# Patient Record
Sex: Male | Born: 1940 | Race: White | Hispanic: No | Smoking: Former smoker
Health system: Southern US, Community
[De-identification: ages and names within clinical notes are randomized; demographics above are authoritative.]

## PROBLEM LIST (undated history)

## (undated) DIAGNOSIS — F419 Anxiety disorder, unspecified: Secondary | ICD-10-CM

## (undated) DIAGNOSIS — M549 Dorsalgia, unspecified: Secondary | ICD-10-CM

## (undated) DIAGNOSIS — N400 Enlarged prostate without lower urinary tract symptoms: Secondary | ICD-10-CM

## (undated) DIAGNOSIS — K648 Other hemorrhoids: Secondary | ICD-10-CM

## (undated) DIAGNOSIS — F329 Major depressive disorder, single episode, unspecified: Secondary | ICD-10-CM

## (undated) DIAGNOSIS — E782 Mixed hyperlipidemia: Secondary | ICD-10-CM

## (undated) DIAGNOSIS — H9202 Otalgia, left ear: Secondary | ICD-10-CM

## (undated) DIAGNOSIS — C4490 Unspecified malignant neoplasm of skin, unspecified: Secondary | ICD-10-CM

## (undated) DIAGNOSIS — F32A Depression, unspecified: Secondary | ICD-10-CM

## (undated) DIAGNOSIS — M629 Disorder of muscle, unspecified: Secondary | ICD-10-CM

## (undated) DIAGNOSIS — F1010 Alcohol abuse, uncomplicated: Secondary | ICD-10-CM

## (undated) DIAGNOSIS — H269 Unspecified cataract: Secondary | ICD-10-CM

## (undated) DIAGNOSIS — K449 Diaphragmatic hernia without obstruction or gangrene: Secondary | ICD-10-CM

## (undated) DIAGNOSIS — C61 Malignant neoplasm of prostate: Secondary | ICD-10-CM

## (undated) DIAGNOSIS — I10 Essential (primary) hypertension: Secondary | ICD-10-CM

## (undated) DIAGNOSIS — K219 Gastro-esophageal reflux disease without esophagitis: Secondary | ICD-10-CM

## (undated) DIAGNOSIS — H6120 Impacted cerumen, unspecified ear: Secondary | ICD-10-CM

## (undated) DIAGNOSIS — I77810 Thoracic aortic ectasia: Secondary | ICD-10-CM

## (undated) HISTORY — DX: Diaphragmatic hernia without obstruction or gangrene: K44.9

## (undated) HISTORY — DX: Major depressive disorder, single episode, unspecified: F32.9

## (undated) HISTORY — DX: Impacted cerumen, unspecified ear: H61.20

## (undated) HISTORY — DX: Disorder of muscle, unspecified: M62.9

## (undated) HISTORY — DX: Essential (primary) hypertension: I10

## (undated) HISTORY — DX: Thoracic aortic ectasia: I77.810

## (undated) HISTORY — DX: Benign prostatic hyperplasia without lower urinary tract symptoms: N40.0

## (undated) HISTORY — DX: Otalgia, left ear: H92.02

## (undated) HISTORY — DX: Dorsalgia, unspecified: M54.9

## (undated) HISTORY — DX: Unspecified malignant neoplasm of skin, unspecified: C44.90

## (undated) HISTORY — DX: Depression, unspecified: F32.A

## (undated) HISTORY — DX: Other hemorrhoids: K64.8

## (undated) HISTORY — PX: HERNIA REPAIR: SHX51

## (undated) HISTORY — PX: BACK SURGERY: SHX140

## (undated) HISTORY — DX: Alcohol abuse, uncomplicated: F10.10

## (undated) HISTORY — DX: Mixed hyperlipidemia: E78.2

## (undated) HISTORY — DX: Anxiety disorder, unspecified: F41.9

## (undated) HISTORY — DX: Gastro-esophageal reflux disease without esophagitis: K21.9

---

## 1995-02-15 HISTORY — PX: LAPAROSCOPIC INGUINAL HERNIA REPAIR: SUR788

## 1996-12-15 HISTORY — PX: OTHER SURGICAL HISTORY: SHX169

## 1998-09-15 HISTORY — PX: LUMBAR SPINE SURGERY: SHX701

## 1998-10-07 HISTORY — PX: MRI: SHX5353

## 1999-02-05 HISTORY — PX: OTHER SURGICAL HISTORY: SHX169

## 1999-03-06 ENCOUNTER — Encounter: Admission: RE | Admit: 1999-03-06 | Discharge: 1999-03-06 | Payer: Self-pay | Admitting: Rheumatology

## 1999-03-06 ENCOUNTER — Encounter: Payer: Self-pay | Admitting: Rheumatology

## 2000-01-16 HISTORY — PX: MYELOGRAM: SHX5347

## 2000-02-09 ENCOUNTER — Encounter: Payer: Self-pay | Admitting: Neurosurgery

## 2000-02-09 ENCOUNTER — Ambulatory Visit (HOSPITAL_COMMUNITY): Admission: RE | Admit: 2000-02-09 | Discharge: 2000-02-09 | Payer: Self-pay | Admitting: Neurosurgery

## 2000-02-15 HISTORY — PX: OTHER SURGICAL HISTORY: SHX169

## 2000-03-02 ENCOUNTER — Other Ambulatory Visit: Admission: RE | Admit: 2000-03-02 | Discharge: 2000-03-02 | Payer: Self-pay | Admitting: Gastroenterology

## 2000-03-02 ENCOUNTER — Encounter (INDEPENDENT_AMBULATORY_CARE_PROVIDER_SITE_OTHER): Payer: Self-pay | Admitting: Specialist

## 2000-03-02 HISTORY — PX: ESOPHAGOGASTRODUODENOSCOPY: SHX1529

## 2000-03-10 ENCOUNTER — Ambulatory Visit (HOSPITAL_COMMUNITY): Admission: RE | Admit: 2000-03-10 | Discharge: 2000-03-10 | Payer: Self-pay | Admitting: Neurosurgery

## 2000-03-10 ENCOUNTER — Encounter: Payer: Self-pay | Admitting: Neurosurgery

## 2003-03-12 ENCOUNTER — Ambulatory Visit (HOSPITAL_BASED_OUTPATIENT_CLINIC_OR_DEPARTMENT_OTHER): Admission: RE | Admit: 2003-03-12 | Discharge: 2003-03-12 | Payer: Self-pay | Admitting: Otolaryngology

## 2003-03-12 ENCOUNTER — Encounter (INDEPENDENT_AMBULATORY_CARE_PROVIDER_SITE_OTHER): Payer: Self-pay | Admitting: *Deleted

## 2003-03-12 ENCOUNTER — Ambulatory Visit (HOSPITAL_COMMUNITY): Admission: RE | Admit: 2003-03-12 | Discharge: 2003-03-12 | Payer: Self-pay | Admitting: Otolaryngology

## 2003-08-23 ENCOUNTER — Encounter (INDEPENDENT_AMBULATORY_CARE_PROVIDER_SITE_OTHER): Payer: Self-pay | Admitting: Internal Medicine

## 2003-08-23 LAB — CONVERTED CEMR LAB: PSA: 10.4 ng/mL

## 2003-09-15 HISTORY — PX: ESOPHAGOGASTRODUODENOSCOPY: SHX1529

## 2003-09-15 HISTORY — PX: COLONOSCOPY: SHX174

## 2005-03-31 ENCOUNTER — Ambulatory Visit: Payer: Self-pay | Admitting: Gastroenterology

## 2005-11-15 ENCOUNTER — Encounter (INDEPENDENT_AMBULATORY_CARE_PROVIDER_SITE_OTHER): Payer: Self-pay | Admitting: Internal Medicine

## 2005-11-15 ENCOUNTER — Ambulatory Visit: Payer: Self-pay | Admitting: Family Medicine

## 2006-02-28 ENCOUNTER — Ambulatory Visit: Payer: Self-pay | Admitting: Family Medicine

## 2006-07-08 ENCOUNTER — Ambulatory Visit: Payer: Self-pay | Admitting: Family Medicine

## 2006-10-19 ENCOUNTER — Ambulatory Visit: Payer: Self-pay | Admitting: Gastroenterology

## 2007-03-03 ENCOUNTER — Encounter (INDEPENDENT_AMBULATORY_CARE_PROVIDER_SITE_OTHER): Payer: Self-pay | Admitting: Internal Medicine

## 2007-03-07 ENCOUNTER — Encounter (INDEPENDENT_AMBULATORY_CARE_PROVIDER_SITE_OTHER): Payer: Self-pay | Admitting: Internal Medicine

## 2007-03-07 DIAGNOSIS — F1010 Alcohol abuse, uncomplicated: Secondary | ICD-10-CM | POA: Insufficient documentation

## 2007-03-07 DIAGNOSIS — K648 Other hemorrhoids: Secondary | ICD-10-CM

## 2007-03-07 DIAGNOSIS — K219 Gastro-esophageal reflux disease without esophagitis: Secondary | ICD-10-CM | POA: Insufficient documentation

## 2007-03-10 ENCOUNTER — Encounter (INDEPENDENT_AMBULATORY_CARE_PROVIDER_SITE_OTHER): Payer: Self-pay | Admitting: Internal Medicine

## 2007-03-10 ENCOUNTER — Ambulatory Visit: Payer: Self-pay | Admitting: Family Medicine

## 2007-03-13 DIAGNOSIS — H9190 Unspecified hearing loss, unspecified ear: Secondary | ICD-10-CM | POA: Insufficient documentation

## 2007-04-21 ENCOUNTER — Ambulatory Visit: Payer: Self-pay | Admitting: Internal Medicine

## 2007-04-28 ENCOUNTER — Ambulatory Visit: Payer: Self-pay | Admitting: Internal Medicine

## 2007-04-28 LAB — CONVERTED CEMR LAB
CO2: 23 meq/L (ref 19–32)
Glucose, Bld: 80 mg/dL (ref 70–99)
Potassium: 4.2 meq/L (ref 3.5–5.3)
Sodium: 141 meq/L (ref 135–145)

## 2007-05-18 HISTORY — PX: CARDIOVASCULAR STRESS TEST: SHX262

## 2007-05-19 ENCOUNTER — Ambulatory Visit: Payer: Self-pay

## 2007-05-19 ENCOUNTER — Encounter: Payer: Self-pay | Admitting: Family Medicine

## 2007-05-19 HISTORY — PX: OTHER SURGICAL HISTORY: SHX169

## 2007-06-23 ENCOUNTER — Ambulatory Visit: Payer: Self-pay | Admitting: Internal Medicine

## 2007-06-23 LAB — CONVERTED CEMR LAB
CO2: 23 meq/L (ref 19–32)
Cholesterol: 172 mg/dL (ref 0–200)
Glucose, Bld: 82 mg/dL (ref 70–99)
Sodium: 140 meq/L (ref 135–145)
Total Bilirubin: 0.8 mg/dL (ref 0.3–1.2)
Total Protein: 6.8 g/dL (ref 6.0–8.3)
Triglycerides: 98 mg/dL (ref <150)
VLDL: 20 mg/dL (ref 0–40)

## 2008-05-17 DIAGNOSIS — C61 Malignant neoplasm of prostate: Secondary | ICD-10-CM

## 2008-05-17 HISTORY — DX: Malignant neoplasm of prostate: C61

## 2008-08-20 ENCOUNTER — Encounter (INDEPENDENT_AMBULATORY_CARE_PROVIDER_SITE_OTHER): Payer: Self-pay | Admitting: *Deleted

## 2008-09-18 ENCOUNTER — Ambulatory Visit: Payer: Self-pay | Admitting: Family Medicine

## 2008-09-27 ENCOUNTER — Ambulatory Visit: Payer: Self-pay | Admitting: Family Medicine

## 2008-09-30 LAB — CONVERTED CEMR LAB
ALT: 18 units/L (ref 0–53)
AST: 19 units/L (ref 0–37)
CO2: 30 meq/L (ref 19–32)
Calcium: 8.8 mg/dL (ref 8.4–10.5)
GFR calc non Af Amer: 64.08 mL/min (ref >60)
HDL: 28.6 mg/dL — ABNORMAL LOW (ref >39.00)
LDL Cholesterol: 126 mg/dL — ABNORMAL HIGH (ref 0–99)
Sodium: 144 meq/L (ref 135–145)
Total CHOL/HDL Ratio: 6
VLDL: 14.6 mg/dL (ref 0.0–40.0)

## 2008-10-25 DIAGNOSIS — E785 Hyperlipidemia, unspecified: Secondary | ICD-10-CM

## 2008-10-25 DIAGNOSIS — I10 Essential (primary) hypertension: Secondary | ICD-10-CM | POA: Insufficient documentation

## 2008-11-13 ENCOUNTER — Ambulatory Visit: Payer: Self-pay | Admitting: Family Medicine

## 2008-11-14 LAB — CONVERTED CEMR LAB
AST: 19 units/L (ref 0–37)
Cholesterol: 119 mg/dL (ref 0–200)

## 2009-05-22 ENCOUNTER — Ambulatory Visit: Payer: Self-pay | Admitting: Family Medicine

## 2009-05-23 LAB — CONVERTED CEMR LAB
Cholesterol: 118 mg/dL (ref 0–200)
HDL: 30.2 mg/dL — ABNORMAL LOW (ref >39.00)
VLDL: 16 mg/dL (ref 0.0–40.0)

## 2009-06-05 ENCOUNTER — Telehealth: Payer: Self-pay | Admitting: Gastroenterology

## 2009-06-06 ENCOUNTER — Ambulatory Visit: Payer: Self-pay | Admitting: Family Medicine

## 2009-06-06 DIAGNOSIS — J320 Chronic maxillary sinusitis: Secondary | ICD-10-CM

## 2009-12-18 ENCOUNTER — Ambulatory Visit: Payer: Self-pay | Admitting: Family Medicine

## 2009-12-18 DIAGNOSIS — J019 Acute sinusitis, unspecified: Secondary | ICD-10-CM | POA: Insufficient documentation

## 2010-02-13 ENCOUNTER — Telehealth: Payer: Self-pay | Admitting: Family Medicine

## 2010-02-13 ENCOUNTER — Ambulatory Visit: Payer: Self-pay | Admitting: Family Medicine

## 2010-02-13 DIAGNOSIS — G4700 Insomnia, unspecified: Secondary | ICD-10-CM

## 2010-04-03 ENCOUNTER — Encounter: Payer: Self-pay | Admitting: Family Medicine

## 2010-06-14 LAB — CONVERTED CEMR LAB
ALT: 14 units/L (ref 0–53)
AST: 14 units/L (ref 0–37)
Albumin: 4.7 g/dL (ref 3.5–5.2)
Alkaline Phosphatase: 71 units/L (ref 39–117)
BUN: 15 mg/dL (ref 6–23)
Basophils Absolute: 0.1 10*3/uL (ref 0.0–0.1)
Basophils Relative: 1 % (ref 0–1)
CO2: 25 meq/L (ref 19–32)
CRP: 0.2 mg/dL (ref <0.6)
Calcium: 9.6 mg/dL (ref 8.4–10.5)
Chloride: 108 meq/L (ref 96–112)
Cholesterol: 202 mg/dL — ABNORMAL HIGH (ref 0–200)
Creatinine, Ser: 1.26 mg/dL (ref 0.40–1.50)
Eosinophils Absolute: 0.3 10*3/uL (ref 0.0–0.7)
Eosinophils Relative: 3 % (ref 0–5)
Glucose, Bld: 84 mg/dL (ref 70–99)
HCT: 47.7 % (ref 39.0–52.0)
HDL: 39 mg/dL — ABNORMAL LOW (ref >39)
Hemoglobin: 16.8 g/dL (ref 13.0–17.0)
LDL Cholesterol: 138 mg/dL — ABNORMAL HIGH (ref 0–99)
Lymphocytes Relative: 29 % (ref 12–46)
Lymphs Abs: 2.4 10*3/uL (ref 0.7–3.3)
MCHC: 35.2 g/dL (ref 30.0–36.0)
MCV: 84.1 fL (ref 78.0–100.0)
Microalb, Ur: 0.86 mg/dL (ref 0.00–1.89)
Monocytes Absolute: 0.7 10*3/uL (ref 0.2–0.7)
Monocytes Relative: 9 % (ref 3–11)
Neutro Abs: 4.8 10*3/uL (ref 1.7–7.7)
Neutrophils Relative %: 59 % (ref 43–77)
Platelets: 197 10*3/uL (ref 150–400)
Potassium: 4.4 meq/L (ref 3.5–5.3)
RBC: 5.67 M/uL (ref 4.22–5.81)
RDW: 13.3 % (ref 11.5–14.0)
Sodium: 142 meq/L (ref 135–145)
TSH: 1.751 microintl units/mL (ref 0.350–5.50)
Total Bilirubin: 0.9 mg/dL (ref 0.3–1.2)
Total CHOL/HDL Ratio: 5.2
Total Protein: 6.8 g/dL (ref 6.0–8.3)
Triglycerides: 126 mg/dL (ref <150)
VLDL: 25 mg/dL (ref 0–40)
WBC: 8.2 10*3/uL (ref 4.0–10.5)

## 2010-06-16 NOTE — Assessment & Plan Note (Signed)
Summary: 10:15 COUGH,CONGESTION,EARS/CLE   Vital Signs:  Patient profile:   70 year old male Weight:      211 pounds BMI:     31.96 Temp:     98.1 degrees F oral Pulse rate:   88 / minute Pulse rhythm:   regular BP sitting:   140 / 80  (left arm) Cuff size:   regular  Vitals Entered By: Lowella Petties CMA (June 06, 2009 10:42 AM) CC: Right ear stopped up, nose is hurting.   History of Present Illness: thinks he has a sinus infection with facial pain  nose and ear are hurting  clear nasal d/c going on about a week- much worse yesterday no fever   no chills or aches  a little dry cough - not bad   no otc meds   Allergies: 1)  Codeine Phosphate (Codeine Phosphate) 2)  Biaxin (Clarithromycin) 3)  Relafen  Past History:  Past Medical History: Last updated: 10/25/2008 1. CHEST PAIN  2. HYPERTENSION 3. HYPERLIPIDEMIA-MIXED   4. EAR PAIN, LEFT   5. HEARING LOSS   6. CERUMEN IMPACTION, LEFT  7. INTERNAL HEMORRHOIDS                   8. HIATAL HERNIA, NODULE   9. BACK PAIN, (KRITZER), MICRODISKECTOMY-GREAT RESULT   10. GERD  11. BENIGN PROSTATIC HYPERTROPHY  12. ANXIETY DEPRESSION, CHARTER ADMISSION  13. ALCOHOL ABUSE   14.  DISORDER, MUSCLE/LIGAMENT NOS, RT LOWER EXTREMITY   15. INGUINAL HERNIA, LEFT, HX OF, REPAIR  16. COLONIC POLYPS, HX OF/ INTERNAL HEMORRHOIDS  17. SKIN CANCER, HX OF, LEFT EAR    Past Surgical History: Last updated: 05/28/2007 Levitin eval - hip pain 02/05/99 Lumbar spine-- 05/00 MRI, degen changes (Dr. 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 Pylori-- 05/05 Colonoscopy, polyps, int. hemorrhoids, repeat 10 years-- 05/05 H. Pylori positive--08/98 L inguinal hernia repair--10/96 ETT Adenosine Myoview nml 05/19/07  Family History: Last updated: 03/12/07 Father: Died at age 76, CAD, HTN, cancer,  prostate-- bone Mother: Alive, HTN, skin cancer--died 03-04-2006--COPD Siblings: 2 brothers,--1 with ? lung ca                                    1 welli                                                                                                                            1sister--died lung ca CV + CAD, older brother, uncle HBP + Mother, father DM + GP CA + Dad, prostate Stroke + GM  Social History: Last updated: 10/25/2008 Marital Status: Married Children: One daughter Occupation: A welder Tobacco Use - Former.  - '73 Alcohol Use - no Regular Exercise - yes Drug Use - no  Risk Factors: Alcohol Use: 0 (09/18/2008) Caffeine Use:  2 (09/18/2008) Exercise: yes (10/25/2008)  Risk Factors: Smoking Status: quit (10/25/2008) Cans of tobacco/wk: 1 3oz bags q3d--chews--started at age 16yrs (09/18/2008) Passive Smoke Exposure: no (03/10/2007)  Review of Systems General:  Complains of fatigue and malaise; denies chills and fever. Eyes:  Denies blurring and eye irritation. ENT:  Complains of earache, hoarseness, nasal congestion, postnasal drainage, sinus pressure, and sore throat. CV:  Denies chest pain or discomfort and lightheadness. Resp:  Complains of cough; denies sputum productive and wheezing. GI:  Denies abdominal pain and change in bowel habits. Derm:  Denies lesion(s) and rash.  Physical Exam  General:  overweight but generally well appearing  Head:  normocephalic, atraumatic, and no abnormalities observed.  tender R maxillary sinus  Eyes:  vision grossly intact, pupils equal, pupils round, pupils reactive to light, and no injection.   Ears:  dull R TM - no drainage L TM nl  Nose:  nares are injected and congested - worse on R Mouth:  pharynx pink and moist, no erythema, and no exudates.   Neck:  No deformities, masses, or tenderness noted. Lungs:  CTA good air exch  no rales or rhonchi Heart:  Normal rate and regular rhythm. S1 and S2 normal without gallop, murmur,  click, rub or other extra sounds. Skin:  Intact without suspicious lesions or rashes Cervical Nodes:  No lymphadenopathy noted Psych:  normal affect, talkative and pleasant    Impression & Recommendations:  Problem # 1:  MAXILLARY SINUSITIS (ICD-473.0) Assessment New with congestion and facial pain  will cover with augmentin  recommend sympt care- see pt instructions   pt advised to update me if symptoms worsen or do not improve - esp fever or facial pain  His updated medication list for this problem includes:    Augmentin 875-125 Mg Tabs (Amoxicillin-pot clavulanate) .Marland Kitchen... 1 by mouth two times a day for 10 days  Orders: Prescription Created Electronically [id number])  Complete Medication List: 1)  Fish Oil 1000 Mg Caps (Omega-3 fatty acids) .... Take one by mouth once a day 2)  Saw Palmetto  .... Tskr 2 by mouth once a day 3)  Omeprazole 20 Mg Tbec (Omeprazole) .... Take one by mouth daily 4)  Simvastatin 20 Mg Tabs (Simvastatin) .... One tablet by mouth daily 5)  Augmentin 875-125 Mg Tabs (Amoxicillin-pot clavulanate) .Marland Kitchen.. 1 by mouth two times a day for 10 days  Patient Instructions: 1)  take the augmentin as directed for sinus infection  2)  drink lots of water and keep using nasal saline spray  3)  a warm compress on face may help also  4)  update me if not improved in 1 week or if you get worse  Prescriptions: AUGMENTIN 875-125 MG TABS (AMOXICILLIN-POT CLAVULANATE) 1 by mouth two times a day for 10 days  #20 x 0   Entered and Authorized by:   Judith Part MD   Signed by:   Judith Part MD on 06/06/2009   Method used:   [facility]        [facility]  [name] [facility]       [address]       [place], [place]  [address]       Ph: [phone] or [phone]       Fax: [phone]   RxID:   [id number]   Prior Medications (reviewed today): FISH OIL 1000 MG  CAPS (OMEGA-3 FATTY ACIDS) Take one by mouth once a day SAW PALMETTO () Tskr 2 by  mouth  once a day OMEPRAZOLE 20 MG TBEC (OMEPRAZOLE) Take one by mouth daily SIMVASTATIN 20 MG TABS (SIMVASTATIN) One tablet by mouth daily AUGMENTIN 875-125 MG TABS (AMOXICILLIN-POT CLAVULANATE) 1 by mouth two times a day for 10 days Current Allergies: CODEINE PHOSPHATE (CODEINE PHOSPHATE) BIAXIN (CLARITHROMYCIN) RELAFEN

## 2010-06-16 NOTE — Assessment & Plan Note (Signed)
Summary: SORE THROAT AND CONGESTION   Vital Signs:  Patient profile:   70 year old male Height:      68.25 inches Weight:      207.75 pounds BMI:     31.47 Temp:     98.5 degrees F oral Pulse rate:   88 / minute Pulse rhythm:   regular BP sitting:   152 / 100  (left arm) Cuff size:   large  Vitals Entered By: Delilah Shan CMA Duncan Dull) (December 18, 2009 2:04 PM) CC: ST, congestion, entire head is extremely painful.   History of Present Illness: Started yesterday with ST, pain in ears.  Getting worse.  + subj fever last night.  Tmax  ~100.  Pain across bridge of nose today.  No vomiting.  Dec in appetite due to nausea.  Minimal cough.  No sick contacts.  Hearing is worse today than normal.    Allergies: 1)  Codeine Phosphate (Codeine Phosphate) 2)  Biaxin (Clarithromycin) 3)  Relafen  Past History:  Social History: Last updated: 12/18/2009 Marital Status: Married Children: One daughter Occupation:  welder Tobacco Use - Former.  - '73 Alcohol Use - no Regular Exercise - yes Drug Use - no  Social History: Reviewed history from 10/25/2008 and no changes required. Marital Status: Married Children: One daughter Occupation:  welder Tobacco Use - Former.  - '73 Alcohol Use - no Regular Exercise - yes Drug Use - no  Review of Systems       See HPI.  Otherwise negative.    Physical Exam  General:  GEN: nad, alert and oriented HEENT: mucous membranes moist, TM w/o erythema but with R SOM, nasal epithelium injected, OP with cobblestoning, max sinuses tender to palpation  NECK: supple w/o LA CV: rrr. PULM: ctab, no inc wob ABD: soft, +bs EXT: no edema    Impression & Recommendations:  Problem # 1:  SINUSITIS - ACUTE-NOS (ICD-461.9) Use nasal saline, rest, take tramadol as needed and start augmentin.  I would go ahead and treat given patient's exam- he is significantly tender to palpation on bilateral max sinuses.   The following medications were removed from the  medication list:    Augmentin 875-125 Mg Tabs (Amoxicillin-pot clavulanate) .Marland Kitchen... 1 by mouth two times a day for 10 days His updated medication list for this problem includes:    Augmentin 875-125 Mg Tabs (Amoxicillin-pot clavulanate) .Marland Kitchen... 1 by mouth two times a day x10d.  Complete Medication List: 1)  Fish Oil 1000 Mg Caps (Omega-3 fatty acids) .... Take one by mouth once a day 2)  Saw Palmetto  .... Tskr 2 by mouth once a day 3)  Omeprazole 20 Mg Tbec (Omeprazole) .... Take one by mouth daily 4)  Simvastatin 20 Mg Tabs (Simvastatin) .... One tablet by mouth daily 5)  Augmentin 875-125 Mg Tabs (Amoxicillin-pot clavulanate) .Marland Kitchen.. 1 by mouth two times a day x10d. 6)  Ultram 50 Mg Tabs (Tramadol hcl) .Marland Kitchen.. 1 by mouth q6h as needed pain with sedation caution  Patient Instructions: 1)  Stay out of work until you don't have a fever and the pain is better.  You are potentially contagious.  Get some rest, take the pain medicine and the antibiotics.  The pain meds may make you drowsy.  You can take tylenol for pain, too.   Prescriptions: ULTRAM 50 MG TABS (TRAMADOL HCL) 1 by mouth q6h as needed pain with sedation caution  #30 x 1   Entered and Authorized by:  Crawford Givens MD   Signed by:   Crawford Givens MD on 12/18/2009   Method used:   Print then Give to Patient   RxID:   [id number] AUGMENTIN 875-125 MG TABS (AMOXICILLIN-POT CLAVULANATE) 1 by mouth two times a day x10d.  #20 x 0   Entered and Authorized by:   Crawford Givens MD   Signed by:   Crawford Givens MD on 12/18/2009   Method used:   Print then Give to Patient   RxID:   [id number]   Current Allergies (reviewed today): CODEINE PHOSPHATE (CODEINE PHOSPHATE) BIAXIN (CLARITHROMYCIN) RELAFEN

## 2010-06-16 NOTE — Progress Notes (Signed)
Summary: Schedule Colonoscopy  Phone Note Outgoing Call Call back at Home Phone [phone]   Call placed by: Harlow Mares CMA Duncan Dull),  June 05, 2009 11:42 AM Call placed to: Patient Summary of Call: patient states that he will call us back in mid feb to schedule I offered to call him back or schedule for feb now but he wanted to wait.  Initial call taken by: Harlow Mares CMA (AAMA),  June 05, 2009 11:44 AM

## 2010-06-16 NOTE — Progress Notes (Signed)
Summary: darvocet discontinued  Phone Note From Pharmacy   [facility]  [address] [facility] Call For: Dr. Dayton Martes   Summary of Call: [facility]  [facility] that [facility] received. The darvocet has been discontinued and they are aking if you can change it to something else. Please advise.  Initial call taken by: Melody Comas,  February 13, 2010 3:31 PM  Follow-up for Phone Call        yes, I warned the patient it was discontinued. Will call in perocet. Ruthe Mannan MD  February 13, 2010 8:45 PM  Patient notified, Rx sent to pharmacy. Follow-up by: Linde Gillis CMA Duncan Dull),  February 16, 2010 8:18 AM    New/Updated Medications: HYDROCODONE-ACETAMINOPHEN 5-325 MG TABS (HYDROCODONE-ACETAMINOPHEN) 1 by mouth up to 4 times per day as needed for pain Prescriptions: HYDROCODONE-ACETAMINOPHEN 5-325 MG TABS (HYDROCODONE-ACETAMINOPHEN) 1 by mouth up to 4 times per day as needed for pain  #30 x 0   Entered by:   Linde Gillis CMA (AAMA)   Authorized by:   Ruthe Mannan MD   Signed by:   Linde Gillis CMA (AAMA) on 02/16/2010   Method used:   Telephoned to ...       [facility]  [address] [facility]ail)       [address]       [place], [place]  [address]       Ph: [phone] or [phone]       Fax: [phone]   RxID:   [id number]

## 2010-06-16 NOTE — Letter (Signed)
Summary: [facility]  [facility]   Imported By: Maryln Gottron 04/13/2010 13:29:18  _____________________________________________________________________  External Attachment:    Type:   Image     Comment:   External Document  Appended Document: [facility]    Clinical Lists Changes  Observations: Added new observation of PAST MED HX: 1. CHEST PAIN  2. HYPERTENSION 3. HYPERLIPIDEMIA-MIXED   4. EAR PAIN, LEFT   5. HEARING LOSS   6. CERUMEN IMPACTION, LEFT  7. INTERNAL HEMORRHOIDS                   8. HIATAL HERNIA, NODULE   9. BACK PAIN, (KRITZER), MICRODISKECTOMY-GREAT RESULT   10. GERD  11. BENIGN PROSTATIC HYPERTROPHY per Dr. Annabell Howells with uro 12. ANXIETY DEPRESSION, CHARTER ADMISSION  13. ALCOHOL ABUSE   14.  DISORDER, MUSCLE/LIGAMENT NOS, RT LOWER EXTREMITY   15. INGUINAL HERNIA, LEFT, HX OF, REPAIR  16. COLONIC POLYPS, HX OF/ INTERNAL HEMORRHOIDS  17. SKIN CANCER, HX OF, LEFT EAR    (04/13/2010 22:47)       Past History:  Past Medical History: 1. CHEST PAIN  2. HYPERTENSION 3. HYPERLIPIDEMIA-MIXED   4. EAR PAIN, LEFT   5. HEARING LOSS   6. CERUMEN IMPACTION, LEFT  7. INTERNAL HEMORRHOIDS                   8. HIATAL HERNIA, NODULE   9. BACK PAIN, (KRITZER), MICRODISKECTOMY-GREAT RESULT   10. GERD  11. BENIGN PROSTATIC HYPERTROPHY per Dr. Annabell Howells with uro 12. ANXIETY DEPRESSION, CHARTER ADMISSION  13. ALCOHOL ABUSE   14.  DISORDER, MUSCLE/LIGAMENT NOS, RT LOWER EXTREMITY   15. INGUINAL HERNIA, LEFT, HX OF, REPAIR  16. COLONIC POLYPS, HX OF/ INTERNAL HEMORRHOIDS  17. SKIN CANCER, HX OF, LEFT EAR

## 2010-06-16 NOTE — Assessment & Plan Note (Signed)
Summary: TROUBLE SLEEPING/BILLIE'S PT/CLE   Vital Signs:  Patient profile:   70 year old male Height:      68.25 inches Weight:      211 pounds BMI:     31.96 Temp:     98.1 degrees F oral Pulse rate:   92 / minute Pulse rhythm:   regular BP sitting:   150 / 110  (left arm) Cuff size:   regular  Vitals Entered By: Linde Gillis CMA Duncan Dull) (February 13, 2010 2:32 PM) CC: trouble sleeping   History of Present Illness: 70 yo new to me here to discuss insomnia.  For years, he has had difficulty falling and staying asleep and it is getting worse. Sometimes wakes up to urinate but cannot fall back asleep. Not snoring, no shortness of breath or choking.    Tried Ambien years ago and it really helped him fall and stay asleep.  Did not feel hung over the next morning.    No recurrent issues with anxiety or depression. Wants a refill of his Darvocet as well for recurrent back and shoulder pain.  Current Medications (verified): 1)  Fish Oil 1000 Mg  Caps (Omega-3 Fatty Acids) .... Take One By Mouth Once A Day 2)  Saw Palmetto .... Tskr 2 By Mouth Once A Day 3)  Omeprazole 20 Mg Tbec (Omeprazole) .... Take One By Mouth Daily 4)  Simvastatin 20 Mg Tabs (Simvastatin) .... One Tablet By Mouth Daily 5)  Darvocet-N 50 50-325 Mg  Tabs (Propoxyphene N-Apap) .Marland Kitchen.. 1 By Mouth 4 Times Daily As Neede For Pain 6)  Ambien 5 Mg Tabs (Zolpidem Tartrate) .Marland Kitchen.. 1 Tab By Mouth At Bedtime As Needed Insomnia  Allergies: 1)  Codeine Phosphate (Codeine Phosphate) 2)  Biaxin (Clarithromycin) 3)  Relafen  Past History:  Past Medical History: Last updated: 10/25/2008 1. CHEST PAIN  2. HYPERTENSION 3. HYPERLIPIDEMIA-MIXED   4. EAR PAIN, LEFT   5. HEARING LOSS   6. CERUMEN IMPACTION, LEFT  7. INTERNAL HEMORRHOIDS                   8. HIATAL HERNIA, NODULE   9. BACK PAIN, (KRITZER), MICRODISKECTOMY-GREAT RESULT   10. GERD  11. BENIGN PROSTATIC HYPERTROPHY  12. ANXIETY DEPRESSION, CHARTER ADMISSION    13. ALCOHOL ABUSE   14.  DISORDER, MUSCLE/LIGAMENT NOS, RT LOWER EXTREMITY   15. INGUINAL HERNIA, LEFT, HX OF, REPAIR  16. COLONIC POLYPS, HX OF/ INTERNAL HEMORRHOIDS  17. SKIN CANCER, HX OF, LEFT EAR    Past Surgical History: Last updated: 05/28/2007 Levitin eval - hip pain 02/05/99 Lumbar spine-- 05/00 MRI, degen changes (Dr. 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 Pylori-- 05/05 Colonoscopy, polyps, int. hemorrhoids, repeat 10 years-- 05/05 H. Pylori positive--08/98 L inguinal hernia repair--10/96 ETT Adenosine Myoview nml 05/19/07  Family History: Last updated: 03/19/2007 Father: Died at age 62, CAD, HTN, cancer, prostate-- bone Mother: Alive, HTN, skin cancer--died 03/11/06--COPD Siblings: 2 brothers,--1 with ? lung ca                                    1 welli  1sister--died lung ca CV + CAD, older brother, uncle HBP + Mother, father DM + GP CA + Dad, prostate Stroke + GM  Social History: Last updated: 12/18/2009 Marital Status: Married Children: One daughter Occupation:  welder Tobacco Use - Former.  - '73 Alcohol Use - no Regular Exercise - yes Drug Use - no  Risk Factors: Alcohol Use: 0 (09/18/2008) Caffeine Use: 2 (09/18/2008) Exercise: yes (10/25/2008)  Risk Factors: Smoking Status: quit (10/25/2008) Cans of tobacco/wk: 1 3oz bags q3d--chews--started at age 52yrs (09/18/2008) Passive Smoke Exposure: no (03/10/2007)  Review of Systems      See HPI General:  Denies fever and malaise. Eyes:  Denies blurring. GU:  Denies incontinence. MS:  Denies joint pain, joint redness, and joint swelling.  Physical Exam  General:  GEN: nad, alert and oriented  Lungs:  Normal respiratory effort, chest  expands symmetrically. Lungs are clear to auscultation, no crackles or wheezes. Heart:  Normal rate and regular rhythm. S1 and S2 normal without gallop, murmur, click, rub or other extra sounds. Psych:  normal affect, talkative and pleasant    Impression & Recommendations:  Problem # 1:  INSOMNIA (ICD-780.52) Assessment Deteriorated Time spent with patient 25 minutes, more than 50% of this time was spent counseling patient on insomnia.  Discussed risks and benefits of sleep aids like ambien for long term use.  He expressed understanding, will try for short period of time.  His updated medication list for this problem includes:    Ambien 5 Mg Tabs (Zolpidem tartrate) .Marland Kitchen... 1 tab by mouth at bedtime as needed insomnia  Complete Medication List: 1)  Fish Oil 1000 Mg Caps (Omega-3 fatty acids) .... Take one by mouth once a day 2)  Saw Palmetto  .... Tskr 2 by mouth once a day 3)  Omeprazole 20 Mg Tbec (Omeprazole) .... Take one by mouth daily 4)  Simvastatin 20 Mg Tabs (Simvastatin) .... One tablet by mouth daily 5)  Darvocet-n 50 50-325 Mg Tabs (Propoxyphene n-apap) .Marland Kitchen.. 1 by mouth 4 times daily as neede for pain 6)  Ambien 5 Mg Tabs (Zolpidem tartrate) .Marland Kitchen.. 1 tab by mouth at bedtime as needed insomnia Prescriptions: AMBIEN 5 MG TABS (ZOLPIDEM TARTRATE) 1 tab by mouth at bedtime as needed insomnia  #30 x 0   Entered and Authorized by:   Ruthe Mannan MD   Signed by:   Ruthe Mannan MD on 02/13/2010   Method used:   Print then Give to Patient   RxID:   [id number] DARVOCET-N 50 50-325 MG  TABS (PROPOXYPHENE N-APAP) 1 by mouth 4 times daily as neede for pain  #30 x 0   Entered and Authorized by:   Ruthe Mannan MD   Signed by:   Ruthe Mannan MD on 02/13/2010   Method used:   Print then Give to Patient   RxID:   [id number]   Current Allergies (reviewed today): CODEINE PHOSPHATE (CODEINE PHOSPHATE) BIAXIN (CLARITHROMYCIN) RELAFEN

## 2010-09-29 NOTE — Assessment & Plan Note (Signed)
[facility] OFFICE NOTE   KENT, RIENDEAU                         MRN:          [id number]  DATE:04/21/2007                            DOB:          [birth year]    PRIMARY CARE PHYSICIAN:  Arta Silence, M.D.   REASON FOR CONSULTATION:  Chest pain.   HISTORY OF PRESENT ILLNESS:  Chad Huang is a very pleasant 70 year old  male with no history of known heart disease.  He denies any history of  hypertension, hyperlipidemia.  He has never had a stress test or cardiac  catheterization.  He is not a diabetic.  He tells me about 8 months ago  he had 2-3 episodes of sharp chest pain which lasted a couple of  seconds.  He thinks it was related to stress.  He did not have any  associated symptoms.  Since that time, he has changed his job and has  not had any chest pain at all.  He is very active.  He walks frequently.  He does some swimming and also lifts weights without problems.  He does  have a problem with severe gastroesophageal reflux disease and has been  eating a lot of cheese and eggs lately.   REVIEW OF SYSTEMS:  He denies any palpitations, no dizziness, no dyspnea  on exertion, no lower extremity edema, no orthopnea, no PND.  He does  have arthritis.  The remainder of the review of systems is negative  except for HPI and problem list.   For a complete H&P review please see the intake form on the chart which  I have reviewed.   PAST MEDICAL HISTORY:  1. Gastroesophageal reflux disease.  2. History of back surgery.   CURRENT MEDICATIONS:  1. Prevacid 30 mg a day.  2. Fish oil 1,000 mg a day.  3. Saw palmetto.   ALLERGIES:  1. CODEINE.  2. BIAXIN.  3. RELAFEN.   SOCIAL HISTORY:  He is married with one child.  He works as a Chemical engineer as a Education administrator.  He has a history of tobacco but quit in  1973.  No alcohol.   FAMILY HISTORY:  Mother died at 63 due to lung problems.  Father died at  73 due to prostate cancer.  Sister died at 3 due to lung problems.  Two  brothers are alive and well.  There is no family history of premature  coronary artery disease.   PHYSICAL EXAMINATION:  GENERAL:  He is well-appearing, no acute  distress, ambulates around the clinic without any respiratory  difficulty.  VITAL SIGNS:  Blood pressure initially on intake was 144/80, manual  recheck I got 150/96.  His heart rate is 64.  His weight is 201.  HEENT:  Normal.  NECK:  Supple.  No JVD.  Carotids are 2 plus bilaterally without any  bruits.  There is no lymphadenopathy or thyromegaly.  CARDIAC:  PMI is nondisplaced.  He is regular with no murmurs, rubs, or  gallops.  LUNGS:  Clear.  ABDOMEN:  Mildly obese.  Nontender, nondistended.  There is no  hepatosplenomegaly.  No bruits.  No masses.  Good bowel sounds.  EXTREMITIES:  Warm with no cyanosis or clubbing.  Trace to 1 plus edema  bilaterally.  Good distal pulses.  No rash.  NEUROLOGIC:  Alert and oriented x3.  Cranial nerves II-XII are intact  except for the fact that he is somewhat hard of hearing.  He moves all 4  extremities without difficulty.  Affect is very pleasant.   EKG shows a normal sinus rhythm, rate of 74, no ST-T wave abnormalities.   LABORATORY:  Shows a glucose of 84, creatinine 1.26.  Normal liver  function panel.  White count is 8.2, hemoglobin 16.8, platelets 197.  Total cholesterol is 202, triglycerides 126, HDL 39, and LDL is 138.   ASSESSMENT/PLAN:  1. Chest pain.  This is very atypical.  I do not think it is angina.      However, given his risk factors I do think it is reasonable to      proceed with screening treadmill Myoview.  We will set this up for      the beginning of the year.  2. Hypertension.  Blood pressure is elevated.  Given his mild renal      insufficiency, I would go ahead and check a urine micro-albumin.  I      do think he would probably benefit from an ACE inhibitor.  We will      start him  on Lisinopril/HCTZ 20/12.5.  I think this will also help      him with his edema.  3. Hyperlipidemia.  While technically his LDL may be close to goal, I      suspect that this is too high for him and still leaves him at some      amount of risk.  We will go ahead and get CRP to further risk      stratify.  If his CRP is elevated, we are going to have a low      threshold to start him on his Statin.  We also did talk about the      need to limit his cheese and egg intake.  4. Metabolic syndrome.  He does have components of metabolic syndrome.      He needs to watch his weight and continue with his exercise      routine.  It would be nice if he could loose a few pounds.   DISPOSITION:  We will see him back in the clinic in 2 months to review  the results of his testing.     Bevelyn Buckles. Bensimhon, MD  Electronically Signed    DRB/MedQ  DD: 04/21/2007  DT: 04/21/2007  Job #: [id number]   cc:   Willaim Sheng D. Bean, FNP  Arta Silence, MD

## 2010-09-29 NOTE — Assessment & Plan Note (Signed)
[facility] OFFICE NOTE   Chad Huang, Chad Huang                         MRN:          [id number]  DATE:06/23/2007                            DOB:          May 02, 1941    PRIMARY CARE PHYSICIAN:  Arta Silence, MD.   INTERVAL HISTORY:  Chad Huang is a very pleasant 70 year old male with a  history of recently diagnosed hypertension and hyperlipidemia.  We first  saw him several months ago for atypical chest pain.  He has had a  Myoview which showed an ejection fraction of 64% and no ischemia.  He  has been doing well.  He has been lifting weights and doing a lot of  walking at work, but no specific aerobic exercise.  He says he has been  very careful about his diet after finding out about his cholesterol.  Cut out all eggs and a lot of other things that are high in fat and  cholesterol.  He has loss 10 pounds.  He does have a history of severe  gastroesophageal reflux disease.   CURRENT MEDICATIONS:  1. Prevacid 30 mg a day.  2. Fish oil 1000 mg a day.  3. Saw palmetto.  4. Lisinopril/HCTZ 20/12.5.   PHYSICAL EXAMINATION:  GENERAL:  He is well appearing in no acute  distress.  He ambulates around the  clinic without any respiratory difficulty.  VITAL SIGNS:  Blood pressure is 118/70, heart rate 71, weight 197.  HEENT:  Normal.  NECK:  Supple.  No JVD.  Carotids are 2+ bilaterally without bruits.  There is no lymphadenopathy or thyromegaly.  CARDIAC:  PMI is nondisplaced.  Regular rate and rhythm with no murmurs,  rubs or gallops.  LUNGS:  Clear.  ABDOMEN:  Soft, nontender, nondistended.  There is no  hepatosplenomegaly.  No bruits, no masses.  Good bowel sounds.  EXTREMITIES:  Warm, no cyanosis, clubbing or edema.  No rash.  NEURO:  He is alert and oriented x3.  Cranial nerves II-XII are intact.  He moves all 4 extremities without difficulty.  He is hard of hearing.  Affect is pleasant.    ASSESSMENT/PLAN:  1. Chest pain.  This was atypical.  It has resolved.  His stress test      is very reassuring.  2. Hypertension, well-controlled.  3. Hyperlipidemia, previous LDL was 138.  His CRP is just mildly      elevated at 0.2.  I would have a very low threshold to start him on      a statin.  We will repeat his lipids today.  Our goal is to get his      LDL under 100.  If it is not, we will go ahead start simvastatin      20.   DISPOSITION:  We will see him back in clinic in 6 months.  I have asked  him to consider starting a walking program and also he will start taking  a baby aspirin a day.     Bevelyn Buckles. Bensimhon, MD  Electronically Signed  DRB/MedQ  DD: 06/23/2007  DT: 06/24/2007  Job #: [id number]

## 2010-09-29 NOTE — Assessment & Plan Note (Signed)
[facility]                         GASTROENTEROLOGY OFFICE NOTE   NAME:DEELHolston, Oyama                         MRN:          [id number]  DATE:10/19/2006                            DOB:          [birth year]    This is a return office visit for GERD.  His reflux symptoms are under  very good control on Prevacid 30 mg daily and antireflux measures.  He  has no dysphagia, odynophagia, change in bowel habits, melena,  hematochezia or abdominal pain.  He would like to return to [employer], as he feels they will be less expensive for him in his current  prescription plan.  He has lost hearing in his left ear since his last  office visit with me.   CURRENT MEDICATIONS:  Listed on the chart, have been reviewed.   MEDICATION ALLERGIES:  CODEINE.   EXAMINATION:  No acute distress.  Weight 206 pounds.  Blood pressure is  120/70, pulse 72 and regular.  CHEST:  Clear to auscultation bilaterally.  CARDIAC:  Regular rate and rhythm without murmurs.  ABDOMEN:  Soft, nontender, nondistended.  Normoactive bowel sounds.  No  palpable organomegaly, masses or hernias.   ASSESSMENT AND PLAN:  1. GERD:  Continue standard antireflux measures.  Prescription for      Prevacid Solutabs 30 mg one p.o. q.a.m. 30 minutes before breakfast      for one year.  Return office visit in one year.  2. Family history of colon polyps and colon cancer.  Mother with      polyps.  Uncle with colon cancer.  Recall colonoscopy recommended      for May 2010.     Venita Lick. Russella Dar, MD, [facility]  Electronically Signed    MTS/MedQ  DD: 10/19/2006  DT: 10/19/2006  Job #: [id number]

## 2010-10-02 NOTE — Op Note (Signed)
[facility]  Patient:    Chad Huang, Chad Huang                         MRN: [id number] Proc. Date: 03/10/00 Adm. Date:  [id number] Disc. Date: [id number] Attending:  Gerald Dexter                           Operative Report  PREOPERATIVE DIAGNOSES:  Spinal stenosis L4-5, possible herniated disk L4-5 left.  POSTOPERATIVE DIAGNOSES:  Spinal stenosis L4-5, secondary spondylosis with herniated disk at L4-5 left.  PROCEDURE: 1. Bilateral decompressive laminectomy at L4-5 followed by left L4-5    microdiskectomy. 2. Microdissection L4-5 disk and L5 nerve root.  SURGEON:  [name], M.D.  ASSISTANT:  Julio Sicks, M.D.  DESCRIPTION OF PROCEDURE:  After having been placed in the prone position, the patients back was prepped and draped in the usual sterile fashion.  A localizing x-ray was taken prior to incision to identify the appropriate level.  A midline incision was made above the spinous processes of L4 and L5. Using the Bovie cautery current, the incision was carried down to the spinous processes.  Subperiosteal dissection was then carried out bilaterally on the spinous processes and the lamina, and the McCullough self-retaining retractor was placed for exposure.  A second x-ray was taken, which showed approach the third interspace from the bottom, which was identified as L4-5 by the radiologist.  At this point, starting at the patients left side, a high-speed drill was used to perform a generous laminotomy by removing the inferior 2/3s of the L4 lamina, the medial one-third of the facet joint, and the superior one-half of the L5 lamina. Residual bone and ligamentum flavum were removed in a piecemeal fashion. A similar decompression was then carried out on the patients right side. Once this was completed, midline decompression was carried out by removing the spinous processes and intraspinous ligament.  Residual ligamentum flavum was removed  until the thecal sac and nerve roots bilaterally were well decompressed.  The microscope was draped and brought into the field and used for the remainder of the case. On the patients left side starting with microdissection technique, the lateral aspect of the thecal sac and L5 nerve root were identified.  Bovie coagulation was carried out down for the counts to identify the L4-5 disk which was found to be herniated beneath the nerve root.  After coagulating on the annulus, the annulus was incised with a 15 blade.  Using pituitary rongeurs and curets, a thorough disk space clean-out was carried out while at the same time, great care was taken to avoid injury to the neural elements, and this was successfully done.  At this point, inspection was carried out in all directions for any evidence of residual compression, and none could be identified.  Large amounts of irrigation were carried out and any bleeding controlled with bipolar coagulation and Gelfoam.  The wound was then closed using interrupted Vicryl in the muscle, fascia, subcutaneous, and subcuticular tissues, and staples on the skin.  A sterile dressing was then applied.  The patient was extubated and taken to the recovery room in stable condition. DD:  03/10/00 TD:  03/10/00 Job: [id number]

## 2010-10-02 NOTE — Op Note (Signed)
   NAME:  [name], [name]                            ACCOUNT NO.:  [id number]   MEDICAL RECORD NO.:  [id number]                   PATIENT TYPE:  AMB   LOCATION:  DSC                                  FACILITY:  [facility]   PHYSICIAN:  Kristine Garbe. Ezzard Standing, M.D.         DATE OF BIRTH:  1941-03-11   DATE OF PROCEDURE:  03/12/2003  DATE OF DISCHARGE:                                 OPERATIVE REPORT   PREOPERATIVE DIAGNOSIS:  Left ear lesion, a 2 cm nodular ulcer.   POSTOPERATIVE DIAGNOSIS:  Left ear lesion, a 2 cm nodular ulcer.   OPERATION:  Excision of left ear lesion with primary closure.   SURGEON:  Kristine Garbe. Ezzard Standing, M.D.   ANESTHESIA:  Local with 1% Xylocaine with 1:100,000 solution of epinephrine.   COMPLICATIONS:  None.   INDICATIONS FOR PROCEDURE:  Chad Huang is a 70 year old gentleman who has  had a lesion on the left ear, now for several months. It started as a small  lesion  and has gradually gotten larger. It has been more painful for the  last month. It measures approximately 1.5 to 2 cm in size on the superior  aspect of the left helix of the ear. He is taken to the operating room at  this time for excisional biopsy.   DESCRIPTION OF PROCEDURE:  The ear was prepped with Betadine solution and  the ear was injected with 3 mL of Xylocaine with epinephrine. The lesion was  elliptically incised and sent to pathology. A defect was closed after  obtaining hemostasis with eye cautery. The lesion was closed with 4-0  chromic sutures subcutaneously and 5-0 nylon on the skin. A pressure mastoid  type dressing was applied.   Haider tolerated  the procedure well. He is discharged to home and instructed  to remove the dressing  tomorrow. I will have him follow up in my office in  one week for recheck, review pathology and remove sutures.                                               Kristine Garbe. Ezzard Standing, M.D.    CEN/MEDQ  D:  03/12/2003  T:  03/12/2003  Job:  [id number]

## 2010-10-02 NOTE — Letter (Signed)
February 28, 2006    [facility] [place]  [address]  [place]  [address]   RE:  LEGRANDE, HAO  MRN:  [id number]  /  DOB:  [birth year]   Dear Milford Cage:   Mr. Birky is a patient in the [facility] and  has been for a number of years.  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  He has a [employer] officer for March 23, 2006.   He does do lip reading but becomes anxious when he is not able to hear or  see the speaker precipitating, at times, anxiety attacks.   I would appreciate it if you would excuse him from jury service at the above  date.  If I may be of further assistance, please do not hesitate to call.    Sincerely,    ______________________________  Atha Starks. Bean, FNP    ______________________________  Arta Silence, MD   BDB/MedQ  /  Job #:  [id number]  DD:  02/28/2006 / DT:  02/28/2006

## 2010-10-15 ENCOUNTER — Encounter: Payer: Self-pay | Admitting: Family Medicine

## 2010-11-02 ENCOUNTER — Encounter: Payer: Self-pay | Admitting: Family Medicine

## 2010-11-02 DIAGNOSIS — Z8546 Personal history of malignant neoplasm of prostate: Secondary | ICD-10-CM | POA: Insufficient documentation

## 2010-11-02 DIAGNOSIS — C61 Malignant neoplasm of prostate: Secondary | ICD-10-CM | POA: Insufficient documentation

## 2010-11-05 ENCOUNTER — Encounter: Payer: Self-pay | Admitting: Cardiovascular Disease

## 2011-01-27 ENCOUNTER — Encounter: Payer: Self-pay | Admitting: Family Medicine

## 2012-05-11 ENCOUNTER — Other Ambulatory Visit: Payer: Self-pay | Admitting: Family Medicine

## 2012-05-11 NOTE — Telephone Encounter (Signed)
You are listed as this patient's PCP but I don't think you have seen him since being on EPIC.  Please advise.

## 2012-05-14 NOTE — Telephone Encounter (Signed)
Sent in, needs CPE set up.  Thanks.

## 2012-05-15 NOTE — Telephone Encounter (Signed)
Patient now has another PCP.  Do not refill any more medications.

## 2012-05-31 ENCOUNTER — Other Ambulatory Visit: Payer: Self-pay | Admitting: Neurosurgery

## 2012-06-08 ENCOUNTER — Encounter (HOSPITAL_COMMUNITY): Payer: Self-pay | Admitting: Pharmacy Technician

## 2012-06-12 NOTE — Pre-Procedure Instructions (Signed)
Dorrian Doggett Fulginiti  06/12/2012   Your procedure is scheduled on:  Thurs, Feb 6 @ 7:30 AM  Report to [facility] at 5:30 AM.  Call this number if you have problems the morning of surgery: [phone]   Remember:   Do not eat food or drink liquids after midnight.   Take these medicines the morning of surgery with A SIP OF WATER: Fluticasone(Flonase),Ultram(Tramadol),and Prilosec(Omeprazole)              Stop taking your Saw Palmetto and Fish Oil.No Aspirin,Ibuprofen,Aleve,Goody's,BC's,or any herbal medications   Do not wear jewelry  Do not wear lotions, powders, or colognes. You may wear deodorant.  Men may shave face and neck.  Do not bring valuables to the hospital.  Contacts, dentures or bridgework may not be worn into surgery.  Leave suitcase in the car. After surgery it may be brought to your room.  For patients admitted to the hospital, checkout time is 11:00 AM the day of  discharge.   Patients discharged the day of surgery will not be allowed to drive  home.    Special Instructions: Shower using CHG 2 nights before surgery and the night before surgery.  If you shower the day of surgery use CHG.  Use special wash - you have one bottle of CHG for all showers.  You should use approximately 1/3 of the bottle for each shower.   Please read over the following fact sheets that you were given: Pain Booklet, Coughing and Deep Breathing, Blood Transfusion Information, MRSA Information and Surgical Site Infection Prevention

## 2012-06-13 ENCOUNTER — Encounter (HOSPITAL_COMMUNITY)
Admission: RE | Admit: 2012-06-13 | Discharge: 2012-06-13 | Disposition: A | Discharge status: Home / Self Care | Payer: Medicare Other | Source: Ambulatory Visit | Attending: Neurosurgery | Admitting: Neurosurgery

## 2012-06-13 ENCOUNTER — Ambulatory Visit (HOSPITAL_COMMUNITY)
Admission: RE | Admit: 2012-06-13 | Discharge: 2012-06-13 | Disposition: A | Discharge status: Home / Self Care | Payer: Medicare Other | Source: Ambulatory Visit | Attending: Anesthesiology | Admitting: Anesthesiology

## 2012-06-13 ENCOUNTER — Encounter (HOSPITAL_COMMUNITY): Payer: Self-pay

## 2012-06-13 HISTORY — DX: Unspecified cataract: H26.9

## 2012-06-13 HISTORY — DX: Malignant neoplasm of prostate: C61

## 2012-06-13 LAB — BASIC METABOLIC PANEL
Calcium: 9.4 mg/dL (ref 8.4–10.5)
Creatinine, Ser: 1.11 mg/dL (ref 0.50–1.35)
GFR calc Af Amer: 75 mL/min — ABNORMAL LOW (ref >90)

## 2012-06-13 LAB — CBC
HCT: 48 % (ref 39.0–52.0)
Platelets: 199 10*3/uL (ref 150–400)
RDW: 13.3 % (ref 11.5–15.5)
WBC: 8.9 10*3/uL (ref 4.0–10.5)

## 2012-06-13 LAB — TYPE AND SCREEN
ABO/RH(D): A NEG
Antibody Screen: NEGATIVE

## 2012-06-13 LAB — SURGICAL PCR SCREEN: Staphylococcus aureus: POSITIVE — AB

## 2012-06-13 LAB — ABO/RH: ABO/RH(D): A NEG

## 2012-06-13 NOTE — Progress Notes (Signed)
Pt states his BP has been elevated for several months and he does not want to take BP medication. Thinks that his BP is elevated because of his back pain. Pt advised to see PCP before surgery for BP evaluation and possible treatment. Pt's daughter will see that he sees his PCP .

## 2012-06-21 MED ORDER — CEFAZOLIN SODIUM-DEXTROSE 2-3 GM-% IV SOLR
2.0000 g | INTRAVENOUS | Status: AC
  Administered 2012-06-22: 2 g via INTRAVENOUS
  Filled 2012-06-21: qty 50

## 2012-06-22 ENCOUNTER — Inpatient Hospital Stay (HOSPITAL_COMMUNITY)
Admission: RE | Admit: 2012-06-22 | Discharge: 2012-06-26 | DRG: 460 | Disposition: A | Discharge status: Home Health | Payer: Medicare Other | Source: Ambulatory Visit | Attending: Neurosurgery | Admitting: Neurosurgery

## 2012-06-22 ENCOUNTER — Encounter (HOSPITAL_COMMUNITY): Payer: Self-pay | Admitting: *Deleted

## 2012-06-22 ENCOUNTER — Encounter (HOSPITAL_COMMUNITY): Payer: Self-pay | Admitting: Anesthesiology

## 2012-06-22 ENCOUNTER — Inpatient Hospital Stay (HOSPITAL_COMMUNITY): Payer: Medicare Other | Admitting: Anesthesiology

## 2012-06-22 ENCOUNTER — Inpatient Hospital Stay (HOSPITAL_COMMUNITY): Payer: Medicare Other

## 2012-06-22 ENCOUNTER — Encounter (HOSPITAL_COMMUNITY)
Admission: RE | Disposition: A | Discharge status: Home Health | Payer: Self-pay | Source: Ambulatory Visit | Attending: Neurosurgery

## 2012-06-22 DIAGNOSIS — Z85828 Personal history of other malignant neoplasm of skin: Secondary | ICD-10-CM

## 2012-06-22 DIAGNOSIS — Z8546 Personal history of malignant neoplasm of prostate: Secondary | ICD-10-CM

## 2012-06-22 DIAGNOSIS — F329 Major depressive disorder, single episode, unspecified: Secondary | ICD-10-CM | POA: Diagnosis present

## 2012-06-22 DIAGNOSIS — F3289 Other specified depressive episodes: Secondary | ICD-10-CM | POA: Diagnosis present

## 2012-06-22 DIAGNOSIS — Z87891 Personal history of nicotine dependence: Secondary | ICD-10-CM

## 2012-06-22 DIAGNOSIS — E782 Mixed hyperlipidemia: Secondary | ICD-10-CM | POA: Diagnosis present

## 2012-06-22 DIAGNOSIS — H9190 Unspecified hearing loss, unspecified ear: Secondary | ICD-10-CM | POA: Diagnosis present

## 2012-06-22 DIAGNOSIS — M5126 Other intervertebral disc displacement, lumbar region: Principal | ICD-10-CM | POA: Diagnosis present

## 2012-06-22 DIAGNOSIS — Z79899 Other long term (current) drug therapy: Secondary | ICD-10-CM

## 2012-06-22 DIAGNOSIS — M5137 Other intervertebral disc degeneration, lumbosacral region: Secondary | ICD-10-CM | POA: Diagnosis present

## 2012-06-22 DIAGNOSIS — M47817 Spondylosis without myelopathy or radiculopathy, lumbosacral region: Secondary | ICD-10-CM | POA: Diagnosis present

## 2012-06-22 DIAGNOSIS — M51379 Other intervertebral disc degeneration, lumbosacral region without mention of lumbar back pain or lower extremity pain: Secondary | ICD-10-CM | POA: Diagnosis present

## 2012-06-22 DIAGNOSIS — Z01818 Encounter for other preprocedural examination: Secondary | ICD-10-CM

## 2012-06-22 DIAGNOSIS — K219 Gastro-esophageal reflux disease without esophagitis: Secondary | ICD-10-CM | POA: Diagnosis present

## 2012-06-22 DIAGNOSIS — Z01812 Encounter for preprocedural laboratory examination: Secondary | ICD-10-CM

## 2012-06-22 DIAGNOSIS — I10 Essential (primary) hypertension: Secondary | ICD-10-CM | POA: Diagnosis present

## 2012-06-22 DIAGNOSIS — Z01810 Encounter for preprocedural cardiovascular examination: Secondary | ICD-10-CM

## 2012-06-22 DIAGNOSIS — N400 Enlarged prostate without lower urinary tract symptoms: Secondary | ICD-10-CM | POA: Diagnosis present

## 2012-06-22 DIAGNOSIS — F411 Generalized anxiety disorder: Secondary | ICD-10-CM | POA: Diagnosis present

## 2012-06-22 SURGERY — MAXIMUM ACCESS (MAS) TRANSFORAMINAL LUMBAR INTERBODY FUSION (TLIF) 1 LEVEL
Anesthesia: General | Site: Back | Laterality: Right | Wound class: Clean

## 2012-06-22 MED ORDER — SODIUM CHLORIDE 0.9 % IR SOLN
Status: DC | PRN
  Administered 2012-06-22: 09:00:00

## 2012-06-22 MED ORDER — HYDROMORPHONE HCL PF 1 MG/ML IJ SOLN
INTRAMUSCULAR | Status: AC
  Filled 2012-06-22: qty 1

## 2012-06-22 MED ORDER — DIAZEPAM 5 MG PO TABS
5.0000 mg | ORAL_TABLET | Freq: Four times a day (QID) | ORAL | Status: DC | PRN
  Administered 2012-06-22 – 2012-06-26 (×8): 5 mg via ORAL
  Filled 2012-06-22 (×9): qty 1

## 2012-06-22 MED ORDER — HYDROMORPHONE HCL PF 1 MG/ML IJ SOLN
1.0000 mg | INTRAMUSCULAR | Status: DC | PRN
  Administered 2012-06-22 – 2012-06-23 (×2): 1 mg via INTRAMUSCULAR
  Administered 2012-06-24: 1.5 mg via INTRAMUSCULAR
  Administered 2012-06-25 (×3): 1 mg via INTRAMUSCULAR
  Filled 2012-06-22: qty 2
  Filled 2012-06-22 (×5): qty 1

## 2012-06-22 MED ORDER — PHENOL 1.4 % MT LIQD: 1.0000 | OROMUCOSAL | Status: DC | PRN

## 2012-06-22 MED ORDER — NEBIVOLOL HCL 10 MG PO TABS
10.0000 mg | ORAL_TABLET | Freq: Every evening | ORAL | Status: DC
  Administered 2012-06-22 – 2012-06-25 (×4): 10 mg via ORAL
  Filled 2012-06-22 (×6): qty 1

## 2012-06-22 MED ORDER — 0.9 % SODIUM CHLORIDE (POUR BTL) OPTIME
TOPICAL | Status: DC | PRN
  Administered 2012-06-22: 1000 mL

## 2012-06-22 MED ORDER — BUPIVACAINE HCL (PF) 0.5 % IJ SOLN
INTRAMUSCULAR | Status: DC | PRN
  Administered 2012-06-22: 15 mL

## 2012-06-22 MED ORDER — SODIUM CHLORIDE 0.9 % IJ SOLN
3.0000 mL | Freq: Two times a day (BID) | INTRAMUSCULAR | Status: DC
  Administered 2012-06-22 – 2012-06-25 (×6): 3 mL via INTRAVENOUS

## 2012-06-22 MED ORDER — DEXAMETHASONE 4 MG PO TABS
4.0000 mg | ORAL_TABLET | Freq: Four times a day (QID) | ORAL | Status: AC
  Filled 2012-06-22 (×2): qty 1

## 2012-06-22 MED ORDER — ROCURONIUM BROMIDE 100 MG/10ML IV SOLN
INTRAVENOUS | Status: DC | PRN
  Administered 2012-06-22: 30 mg via INTRAVENOUS
  Administered 2012-06-22: 5 mg via INTRAVENOUS

## 2012-06-22 MED ORDER — NEOSTIGMINE METHYLSULFATE 1 MG/ML IJ SOLN
INTRAMUSCULAR | Status: DC | PRN
  Administered 2012-06-22: 3 mg via INTRAVENOUS

## 2012-06-22 MED ORDER — FENTANYL CITRATE 0.05 MG/ML IJ SOLN
INTRAMUSCULAR | Status: DC | PRN
  Administered 2012-06-22 (×2): 50 ug via INTRAVENOUS
  Administered 2012-06-22: 150 ug via INTRAVENOUS

## 2012-06-22 MED ORDER — ONDANSETRON HCL 4 MG/2ML IJ SOLN: 4.0000 mg | Freq: Once | INTRAMUSCULAR | Status: DC | PRN

## 2012-06-22 MED ORDER — LACTATED RINGERS IV SOLN
INTRAVENOUS | Status: DC | PRN
  Administered 2012-06-22 (×3): via INTRAVENOUS

## 2012-06-22 MED ORDER — ACETAMINOPHEN 325 MG PO TABS
650.0000 mg | ORAL_TABLET | ORAL | Status: DC | PRN
  Administered 2012-06-24: 650 mg via ORAL
  Filled 2012-06-22: qty 2

## 2012-06-22 MED ORDER — HYDROCODONE-ACETAMINOPHEN 5-325 MG PO TABS
1.0000 | ORAL_TABLET | ORAL | Status: DC | PRN
  Administered 2012-06-22 – 2012-06-25 (×6): 2 via ORAL
  Administered 2012-06-25: 1 via ORAL
  Administered 2012-06-26 (×2): 2 via ORAL
  Filled 2012-06-22 (×9): qty 2
  Filled 2012-06-22: qty 1

## 2012-06-22 MED ORDER — ONDANSETRON HCL 4 MG/2ML IJ SOLN
INTRAMUSCULAR | Status: DC | PRN
  Administered 2012-06-22: 4 mg via INTRAVENOUS

## 2012-06-22 MED ORDER — LIDOCAINE HCL (CARDIAC) 20 MG/ML IV SOLN
INTRAVENOUS | Status: DC | PRN
  Administered 2012-06-22: 70 mg via INTRAVENOUS

## 2012-06-22 MED ORDER — PANTOPRAZOLE SODIUM 40 MG IV SOLR
40.0000 mg | Freq: Every day | INTRAVENOUS | Status: DC
  Administered 2012-06-22: 40 mg via INTRAVENOUS
  Filled 2012-06-22 (×2): qty 40

## 2012-06-22 MED ORDER — CEFAZOLIN SODIUM-DEXTROSE 2-3 GM-% IV SOLR
2.0000 g | Freq: Three times a day (TID) | INTRAVENOUS | Status: AC
  Administered 2012-06-22 – 2012-06-23 (×3): 2 g via INTRAVENOUS
  Filled 2012-06-22 (×3): qty 50

## 2012-06-22 MED ORDER — DEXAMETHASONE SODIUM PHOSPHATE 4 MG/ML IJ SOLN
4.0000 mg | Freq: Four times a day (QID) | INTRAMUSCULAR | Status: AC
  Administered 2012-06-22 (×2): 4 mg via INTRAVENOUS
  Filled 2012-06-22 (×2): qty 1

## 2012-06-22 MED ORDER — GLYCOPYRROLATE 0.2 MG/ML IJ SOLN
INTRAMUSCULAR | Status: DC | PRN
  Administered 2012-06-22: 0.2 mg via INTRAVENOUS
  Administered 2012-06-22: 0.4 mg via INTRAVENOUS

## 2012-06-22 MED ORDER — ARTIFICIAL TEARS OP OINT
TOPICAL_OINTMENT | OPHTHALMIC | Status: DC | PRN
  Administered 2012-06-22: 1 via OPHTHALMIC

## 2012-06-22 MED ORDER — BACITRACIN 50000 UNITS IM SOLR
INTRAMUSCULAR | Status: AC
  Filled 2012-06-22: qty 1

## 2012-06-22 MED ORDER — HEMOSTATIC AGENTS (NO CHARGE) OPTIME: TOPICAL | Status: DC | PRN

## 2012-06-22 MED ORDER — PROPOFOL 10 MG/ML IV BOLUS
INTRAVENOUS | Status: DC | PRN
  Administered 2012-06-22: 200 mg via INTRAVENOUS

## 2012-06-22 MED ORDER — THROMBIN 20000 UNITS EX SOLR
CUTANEOUS | Status: DC | PRN
  Administered 2012-06-22: 10:00:00 via TOPICAL

## 2012-06-22 MED ORDER — MENTHOL 3 MG MT LOZG: 1.0000 | LOZENGE | OROMUCOSAL | Status: DC | PRN

## 2012-06-22 MED ORDER — SODIUM CHLORIDE 0.9 % IV SOLN: 250.0000 mL | INTRAVENOUS | Status: DC

## 2012-06-22 MED ORDER — ONDANSETRON HCL 4 MG/2ML IJ SOLN
4.0000 mg | INTRAMUSCULAR | Status: DC | PRN
  Administered 2012-06-23: 4 mg via INTRAVENOUS
  Filled 2012-06-22 (×2): qty 2

## 2012-06-22 MED ORDER — HYDROMORPHONE HCL PF 1 MG/ML IJ SOLN
0.2500 mg | INTRAMUSCULAR | Status: DC | PRN
  Administered 2012-06-22 (×2): 0.25 mg via INTRAVENOUS

## 2012-06-22 MED ORDER — LIDOCAINE HCL 4 % MT SOLN
OROMUCOSAL | Status: DC | PRN
  Administered 2012-06-22: 4 mL via TOPICAL

## 2012-06-22 MED ORDER — FLUTICASONE PROPIONATE 50 MCG/ACT NA SUSP
1.0000 | Freq: Every day | NASAL | Status: DC
  Administered 2012-06-23 – 2012-06-25 (×3): 1 via NASAL
  Filled 2012-06-22 (×2): qty 16

## 2012-06-22 MED ORDER — KCL IN DEXTROSE-NACL 20-5-0.45 MEQ/L-%-% IV SOLN
80.0000 mL/h | INTRAVENOUS | Status: DC
  Administered 2012-06-22 – 2012-06-25 (×5): 80 mL/h via INTRAVENOUS
  Filled 2012-06-22 (×9): qty 1000

## 2012-06-22 MED ORDER — THROMBIN 20000 UNITS EX SOLR: OROMUCOSAL | Status: DC | PRN

## 2012-06-22 MED ORDER — MICROFIBRILLAR COLL HEMOSTAT EX PADS
MEDICATED_PAD | CUTANEOUS | Status: DC | PRN
  Administered 2012-06-22: 1 via TOPICAL

## 2012-06-22 MED ORDER — SUCCINYLCHOLINE CHLORIDE 20 MG/ML IJ SOLN
INTRAMUSCULAR | Status: DC | PRN
  Administered 2012-06-22: 100 mg via INTRAVENOUS

## 2012-06-22 MED ORDER — SODIUM CHLORIDE 0.9 % IV SOLN
INTRAVENOUS | Status: AC
  Filled 2012-06-22: qty 500

## 2012-06-22 MED ORDER — ZOLPIDEM TARTRATE 5 MG PO TABS
5.0000 mg | ORAL_TABLET | Freq: Every evening | ORAL | Status: DC | PRN
  Administered 2012-06-22 – 2012-06-25 (×3): 5 mg via ORAL
  Filled 2012-06-22 (×3): qty 1

## 2012-06-22 MED ORDER — EPHEDRINE SULFATE 50 MG/ML IJ SOLN
INTRAMUSCULAR | Status: DC | PRN
  Administered 2012-06-22: 5 mg via INTRAVENOUS
  Administered 2012-06-22: 10 mg via INTRAVENOUS
  Administered 2012-06-22: 5 mg via INTRAVENOUS
  Administered 2012-06-22: 10 mg via INTRAVENOUS
  Administered 2012-06-22 (×2): 5 mg via INTRAVENOUS

## 2012-06-22 MED ORDER — ACETAMINOPHEN 650 MG RE SUPP: 650.0000 mg | RECTAL | Status: DC | PRN

## 2012-06-22 MED ORDER — SODIUM CHLORIDE 0.9 % IJ SOLN: 3.0000 mL | INTRAMUSCULAR | Status: DC | PRN

## 2012-06-22 SURGICAL SUPPLY — 75 items
ADH SKN CLS APL DERMABOND .7 (GAUZE/BANDAGES/DRESSINGS) ×1
APL SKNCLS STERI-STRIP NONHPOA (GAUZE/BANDAGES/DRESSINGS) ×1
Ardis Peek-Optima ×2 IMPLANT
BAG DECANTER FOR FLEXI CONT (MISCELLANEOUS) ×2 IMPLANT
BENZOIN TINCTURE PRP APPL 2/3 (GAUZE/BANDAGES/DRESSINGS) ×2 IMPLANT
BLADE SURG ROTATE 9660 (MISCELLANEOUS) IMPLANT
BONE EQUIVA 10CC (Bone Implant) ×2 IMPLANT
BRUSH SCRUB EZ PLAIN DRY (MISCELLANEOUS) ×2 IMPLANT
BUR CUTTER 7.0 ROUND (BURR) ×2 IMPLANT
BUR MATCHSTICK NEURO 3.0 LAGG (BURR) ×2 IMPLANT
CANISTER SUCTION 2500CC (MISCELLANEOUS) ×2 IMPLANT
CLIP NEUROVISION LG (CLIP) ×2 IMPLANT
CLOTH BEACON ORANGE TIMEOUT ST (SAFETY) ×2 IMPLANT
CONT SPEC 4OZ CLIKSEAL STRL BL (MISCELLANEOUS) ×4 IMPLANT
COVER BACK TABLE 24X17X13 BIG (DRAPES) IMPLANT
COVER TABLE BACK 60X90 (DRAPES) ×2 IMPLANT
DERMABOND ADVANCED (GAUZE/BANDAGES/DRESSINGS) ×1
DERMABOND ADVANCED .7 DNX12 (GAUZE/BANDAGES/DRESSINGS) ×1 IMPLANT
DRAPE C-ARM 42X72 X-RAY (DRAPES) ×2 IMPLANT
DRAPE C-ARMOR (DRAPES) ×2 IMPLANT
DRAPE LAPAROTOMY 100X72X124 (DRAPES) ×2 IMPLANT
DRAPE SURG 17X23 STRL (DRAPES) ×4 IMPLANT
DRESSING TELFA 8X3 (GAUZE/BANDAGES/DRESSINGS) ×2 IMPLANT
DURAPREP 26ML APPLICATOR (WOUND CARE) ×2 IMPLANT
DURASEAL SPINE SEALANT 3ML (MISCELLANEOUS) ×2 IMPLANT
ELECT BLADE 4.0 EZ CLEAN MEGAD (MISCELLANEOUS) ×2
ELECT REM PT RETURN 9FT ADLT (ELECTROSURGICAL) ×2
ELECTRODE BLDE 4.0 EZ CLN MEGD (MISCELLANEOUS) ×1 IMPLANT
ELECTRODE REM PT RTRN 9FT ADLT (ELECTROSURGICAL) ×1 IMPLANT
EVACUATOR 1/8 PVC DRAIN (DRAIN) ×2 IMPLANT
EXTENDED TIP APPLICATOR ×2 IMPLANT
GAUZE SPONGE 4X4 16PLY XRAY LF (GAUZE/BANDAGES/DRESSINGS) IMPLANT
GLOVE BIO SURGEON STRL SZ8.5 (GLOVE) ×2 IMPLANT
GLOVE BIOGEL PI IND STRL 7.0 (GLOVE) ×2 IMPLANT
GLOVE BIOGEL PI INDICATOR 7.0 (GLOVE) ×2
GLOVE ECLIPSE 7.5 STRL STRAW (GLOVE) ×4 IMPLANT
GLOVE EXAM NITRILE LRG STRL (GLOVE) IMPLANT
GLOVE EXAM NITRILE MD LF STRL (GLOVE) IMPLANT
GLOVE EXAM NITRILE XS STR PU (GLOVE) IMPLANT
GLOVE SS BIOGEL STRL SZ 8 (GLOVE) ×1 IMPLANT
GLOVE SUPERSENSE BIOGEL SZ 8 (GLOVE) ×1
GOWN BRE IMP SLV AUR LG STRL (GOWN DISPOSABLE) IMPLANT
GOWN BRE IMP SLV AUR XL STRL (GOWN DISPOSABLE) ×4 IMPLANT
GOWN STRL REIN 2XL LVL4 (GOWN DISPOSABLE) IMPLANT
GUIDEWIRE NITINOL BEVEL TIP (WIRE) ×4 IMPLANT
KIT BASIN OR (CUSTOM PROCEDURE TRAY) ×2 IMPLANT
KIT NEEDLE NVM5 EMG ELECT (KITS) ×1 IMPLANT
KIT NEEDLE NVM5 EMG ELECTRODE (KITS) ×1
KIT ROOM TURNOVER OR (KITS) ×2 IMPLANT
LIGHT SOURCE ANGLE TIP STR 7FT (MISCELLANEOUS) ×2 IMPLANT
NEEDLE HYPO 22GX1.5 SAFETY (NEEDLE) ×2 IMPLANT
NEEDLE I-PASS III (NEEDLE) ×2 IMPLANT
NS IRRIG 1000ML POUR BTL (IV SOLUTION) ×2 IMPLANT
PACK LAMINECTOMY NEURO (CUSTOM PROCEDURE TRAY) ×2 IMPLANT
PAD ARMBOARD 7.5X6 YLW CONV (MISCELLANEOUS) ×6 IMPLANT
PATTIES SURGICAL .75X.75 (GAUZE/BANDAGES/DRESSINGS) IMPLANT
PRECEPT SHANK 6.5X45 (Neuro Prosthesis/Implant) ×4 IMPLANT
PRECEPT TULIPS (Neuro Prosthesis/Implant) ×4 IMPLANT
ROD 45MM (Rod) ×2 IMPLANT
SCREW PRECEPT SET (Screw) ×4 IMPLANT
SPONGE GAUZE 4X4 12PLY (GAUZE/BANDAGES/DRESSINGS) ×2 IMPLANT
SPONGE LAP 4X18 X RAY DECT (DISPOSABLE) IMPLANT
SPONGE SURGIFOAM ABS GEL 100 (HEMOSTASIS) ×2 IMPLANT
STRIP CLOSURE SKIN 1/2X4 (GAUZE/BANDAGES/DRESSINGS) ×2 IMPLANT
SUT VIC AB 0 CT1 18XCR BRD8 (SUTURE) ×1 IMPLANT
SUT VIC AB 0 CT1 8-18 (SUTURE) ×1
SUT VIC AB 2-0 OS6 18 (SUTURE) ×6 IMPLANT
SUT VIC AB 3-0 CP2 18 (SUTURE) ×2 IMPLANT
SYR 20ML ECCENTRIC (SYRINGE) ×2 IMPLANT
TAPE STRIPS DRAPE STRL (GAUZE/BANDAGES/DRESSINGS) ×2 IMPLANT
TOWEL OR 17X24 6PK STRL BLUE (TOWEL DISPOSABLE) ×2 IMPLANT
TOWEL OR 17X26 10 PK STRL BLUE (TOWEL DISPOSABLE) ×2 IMPLANT
TRAP SPECIMEN MUCOUS 40CC (MISCELLANEOUS) ×2 IMPLANT
TRAY FOLEY CATH 14FRSI W/METER (CATHETERS) ×2 IMPLANT
WATER STERILE IRR 1000ML POUR (IV SOLUTION) ×2 IMPLANT

## 2012-06-22 NOTE — H&P (Signed)
Chad Huang is an 72 y.o. male.   Chief Complaint: Back and right leg pain HPI: The patient is a 72 year old gentleman who had back surgery many years ago. He did extremely well at that time but has recently developed recurrent back and right lower extremity discomfort. He was tried aggressive conservative therapy without improvement and underwent imaging studies which showed marked disc space collapse and degeneration at L4-5 which is the third level op with a diffuse disc herniation through the foramen on the right with marked neural compression. After discussing the options the patient requested surgery now comes for a minimally invasive T. lift at L4-5 on the right with pedicle screw fixation. I've had a long discussion with him regarding the risks and benefits of surgical intervention. The risks discussed include but are not limited to bleeding infection weakness numbness paralysis trouble with instrumentation nonunion coma and death. We have discussed alternative methods of therapy offered risks and benefits of nonintervention. He has had the opportunity to ask numerous questions and appears to understand. With this information in hand he has requested we proceed with surgery.  Past Medical History  Diagnosis Date  . Chest pain   . HTN (hypertension)   . Hyperlipidemia, mixed   . Left ear pain   . Hearing loss   . Cerumen impaction     left  . Internal hemorrhoids   . Hiatal hernia     nodule  . Back pain     ([name]), microdiskectomy-great result  . GERD (gastroesophageal reflux disease)   . Benign prostatic hypertrophy     per Dr Annabell Howells with uro  . Anxiety and depression     charter, admission  . Alcohol abuse   . Muscle disorder     disorder, muscle/ligaminet nis, rt lower extremity  . Inguinal hernia     left, hx of, repair  . Skin cancer     hx, left ear  . Prostate cancer 2010    Dr Annabell Howells  . Cataracts, bilateral     Past Surgical History  Procedure Date  . Levitin  eval 02/05/99    hip pain  . Lumbar spine surgery 5/00  . Mri 10/07/98    degen changes 9Dr. Madelon Lips) steroid injections  . Myelogram 9/01    stenosis L4-5  . Esophagogastroduodenoscopy 03/02/00    gastritis, chronic  . Back microdiskectomy 10/01    [name]  . Esophagogastroduodenoscopy 5/05    polyps in antrum, neg H Pylori  . Colonoscopy 5/05    polyps int hemorrhoids, repeat 10 yrs  . H pylori positive 8/98  . Laparoscopic inguinal hernia repair 10/96  . Ett 05/19/07    Adenosine Muoview  . Cardiovascular stress test 2009    [facility]  . Back surgery   . Hernia repair     Family History  Problem Relation Age of Onset  . Stroke      grandmother   Social History:  reports that he has quit smoking. His smoking use included Cigarettes. He has a 20 pack-year smoking history. His smokeless tobacco use includes Chew. He reports that he does not drink alcohol or use illicit drugs.  Allergies:  Allergies  Allergen Reactions  . Clarithromycin     REACTION: unspecified  . Codeine Phosphate     REACTION: nausea  . Nabumetone     REACTION: Muscle cramps    Medications Prior to Admission  Medication Sig Dispense Refill  . fluticasone (FLONASE) 50 MCG/ACT nasal spray Place 1  spray into the nose at bedtime.      . meloxicam (MOBIC) 15 MG tablet Take 15 mg by mouth daily.      . nebivolol (BYSTOLIC) 10 MG tablet Take 10 mg by mouth every evening.      Marland Kitchen omeprazole (PRILOSEC OTC) 20 MG tablet Take 20 mg by mouth daily.        . traMADol (ULTRAM) 50 MG tablet Take 50 mg by mouth every 6 (six) hours as needed. For pain      . Omega-3 Fatty Acids (FISH OIL) 1000 MG CAPS Take 1 capsule by mouth daily.       . saw palmetto 80 MG capsule Take 80 mg by mouth daily. Does not specified        No results found for this or any previous visit (from the past 48 hour(s)). No results found.  Review of systems not obtained due to patient factors.  Blood pressure 157/81, pulse 59, temperature 98  F (36.7 C), temperature source Oral, resp. rate 18, SpO2 97.00%.  The patient is awake alert and oriented. His no facial asymmetry. His gait is slow but nonantalgic. Reflexes are decreased but equal. His strength is mildly decreased extensor pollicis longus on the right. Assessment/Plan Impression is that of spondylosis degenerative disc disease and diffuse disc herniation at L4-5. The plan is for an L4-5 transverse lumbar interbody fusion with pedicle screw fixation.  Chad Meeker, MD 06/22/2012, 7:33 AM

## 2012-06-22 NOTE — Op Note (Signed)
Preop diagnosis: Spondylosis and herniated disc L4-5 right with foraminal collapse and L4 and L5 nerve root compression Postop diagnosis: Same Procedure: Right L4-5 mastlif with peek interbody spacer with decompression of right L4 and L5 nerve roots more so than needed for interbody fusion with right L4-5 percutaneous pedicle screw fixation with the base of pedicle screw system Surgeon: Dalila Arca Assistant: Lovell Sheehan  After being placed the prone position the patient's back was prepped and draped in the usual sterile fashion. The pedicles at L4 and L5 on the right were cannulated with the Jamshidi needle in standard fashion. These were followed in AP lateral fluoroscopy and also with intraoperative EMG monitoring and placed in good position. Guidewires were placed and the Jamshidi needle was removed. The incision between the 2 guidewires was connected and the fascia incised. We tapped with a 5.5 mm tap and placed 6.5 x 45 mm screws at L4 and L5. The retractor blades were attached to the screws in standard fashion. We attached the retractor to the blades and secured to the table with the arm. We opened up in the craniocaudal direction and attached the medial blade and opened up and a lateral medial direction. X-ray confirmed approach the appropriate level. We then spent approximately 10-15 minutes removing soft tissue to expose the residual facet joint residual lamina of L4 and L5 on the right. We then used a high-speed drill to remove the medial 80% of the facet joint and the residual lamina. L4 and L5 nerves were decompressed. There was a large piece of calcified scar that was adherent to the dura and in the process of removing the scar a piece of dura was a bolster. This was patched at the end with Surgicel and muscle and dura seal. We incised the disc space and thoroughly cleaned out with pituitary rongeurs and curettes. We then distracted up to a 10 mm size and prepared for interbody fusion. We survived  decorticating instruments and then placed a mixture of autologous bone morselized allograft it with the interspace. We chose a 10 x 11 x 34 mm peek interbody spacer filled with the same mixture. We then impacted into the space without difficulty and fluoroscopy showed to be in good position once was kicked in the transverse position. We irrigated copiously metabolic irrigation and placed our DuraSeal followed by Gelfoam. We advanced the screws into final position and attached the polyaxial tulip heads. We then chose appropriate length rod lower down into the top of the screws and secured at the top loading nuts. We did tightening and final tightening with torque and counter torque and then removed the Towers from the top of the tulip heads. We then irrigated the wound copiously. Final fossae AP lateral direction looked excellent. The was then closed in multiple layers of Vicryl on the fascia subcutaneous and subcuticular tissues. Dermabond Steri-Strips were placed on the skin. Shortness was then applied the patient was extubated taken to recovery in stable condition.

## 2012-06-22 NOTE — Preoperative (Signed)
Beta Blockers   Reason not to administer Beta Blockers:Not Applicable 

## 2012-06-22 NOTE — Transfer of Care (Signed)
Immediate Anesthesia Transfer of Care Note  Patient: Hagen Bohorquez Xue  Procedure(s) Performed: Procedure(s) (LRB) with comments: MAXIMUM ACCESS (MAS) TRANSFORAMINAL LUMBAR INTERBODY FUSION (TLIF) 1 LEVEL (Right) - Right L4-5 Maximum access transforaminal lumbar interbody fusion  Patient Location: PACU  Anesthesia Type:General  Level of Consciousness: awake, alert  and oriented  Airway & Oxygen Therapy: Patient Spontanous Breathing and Patient connected to face mask oxygen  Post-op Assessment: Report given to PACU RN  Post vital signs: Reviewed and stable  Complications: No apparent anesthesia complications

## 2012-06-22 NOTE — Anesthesia Preprocedure Evaluation (Signed)
Anesthesia Evaluation  Patient identified by MRN, date of birth, ID band Patient awake    Reviewed: Allergy & Precautions, H&P , NPO status , Patient's Chart, lab work & pertinent test results  Airway Mallampati: I TM Distance: >3 FB Neck ROM: full    Dental   Pulmonary          Cardiovascular hypertension, Rhythm:regular Rate:Normal     Neuro/Psych    GI/Hepatic hiatal hernia, GERD-  ,  Endo/Other    Renal/GU      Musculoskeletal   Abdominal   Peds  Hematology   Anesthesia Other Findings   Reproductive/Obstetrics                           Anesthesia Physical Anesthesia Plan  ASA: II  Anesthesia Plan: General   Post-op Pain Management:    Induction: Intravenous  Airway Management Planned: Oral ETT  Additional Equipment:   Intra-op Plan:   Post-operative Plan: Extubation in OR  Informed Consent: I have reviewed the patients History and Physical, chart, labs and discussed the procedure including the risks, benefits and alternatives for the proposed anesthesia with the patient or authorized representative who has indicated his/her understanding and acceptance.     Plan Discussed with: CRNA, Anesthesiologist and Surgeon  Anesthesia Plan Comments:         Anesthesia Quick Evaluation

## 2012-06-22 NOTE — Anesthesia Postprocedure Evaluation (Signed)
  Anesthesia Post-op Note  Patient: Chad Huang  Procedure(s) Performed: Procedure(s) (LRB) with comments: MAXIMUM ACCESS (MAS) TRANSFORAMINAL LUMBAR INTERBODY FUSION (TLIF) 1 LEVEL (Right) - Right L4-5 Maximum access transforaminal lumbar interbody fusion  Patient Location: PACU  Anesthesia Type:General  Level of Consciousness: awake, alert  and oriented  Airway and Oxygen Therapy: Patient Spontanous Breathing and Patient connected to nasal cannula oxygen  Post-op Pain: none  Post-op Assessment: Post-op Vital signs reviewed, Patient's Cardiovascular Status Stable, Respiratory Function Stable, Patent Airway, No signs of Nausea or vomiting and Pain level controlled  Post-op Vital Signs: Reviewed and stable  Complications: No apparent anesthesia complications

## 2012-06-23 MED ORDER — DIPHENHYDRAMINE HCL 50 MG/ML IJ SOLN: 12.5000 mg | Freq: Four times a day (QID) | INTRAMUSCULAR | Status: DC | PRN

## 2012-06-23 MED ORDER — SODIUM CHLORIDE 0.9 % IJ SOLN: 9.0000 mL | INTRAMUSCULAR | Status: DC | PRN

## 2012-06-23 MED ORDER — ONDANSETRON HCL 4 MG/2ML IJ SOLN
4.0000 mg | Freq: Four times a day (QID) | INTRAMUSCULAR | Status: DC | PRN
  Administered 2012-06-24: 4 mg via INTRAVENOUS

## 2012-06-23 MED ORDER — PANTOPRAZOLE SODIUM 40 MG PO TBEC
40.0000 mg | DELAYED_RELEASE_TABLET | Freq: Every day | ORAL | Status: DC
  Administered 2012-06-23 – 2012-06-25 (×3): 40 mg via ORAL
  Filled 2012-06-23 (×3): qty 1

## 2012-06-23 MED ORDER — DIPHENHYDRAMINE HCL 12.5 MG/5ML PO ELIX: 12.5000 mg | ORAL_SOLUTION | Freq: Four times a day (QID) | ORAL | Status: DC | PRN

## 2012-06-23 MED ORDER — MORPHINE SULFATE (PF) 1 MG/ML IV SOLN
INTRAVENOUS | Status: DC
  Administered 2012-06-23: 17:00:00 via INTRAVENOUS
  Administered 2012-06-23: 16 mg via INTRAVENOUS
  Administered 2012-06-23: 21:00:00 via INTRAVENOUS
  Administered 2012-06-24 (×2): 6 mg via INTRAVENOUS
  Administered 2012-06-24: 1 mg via INTRAVENOUS
  Administered 2012-06-24: 8 mg via INTRAVENOUS
  Filled 2012-06-23 (×2): qty 25

## 2012-06-23 MED ORDER — NALOXONE HCL 0.4 MG/ML IJ SOLN: 0.4000 mg | INTRAMUSCULAR | Status: DC | PRN

## 2012-06-23 NOTE — Progress Notes (Signed)
Nutrition Brief Note  Patient identified on the Malnutrition Screening Tool (MST) Report  Pt reports about 10 lb weight loss PTA by being more careful and following a healthy diet.   Body mass index is 30.84 kg/(m^2). 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   Current diet order is Regular, patient is consuming approximately 100% of meals at this time. Labs and medications reviewed.   No nutrition interventions warranted at this time. If nutrition issues arise, please consult RD.   Kendell Bane RD, LDN, CNSC [phone] Pager [phone] After Hours Pager

## 2012-06-23 NOTE — Progress Notes (Signed)
Patient ID: Chad Huang, male   DOB: February 23, 1941, 72 y.o.   MRN: [id number] Subjective: Patient reports feeling good  Objective: Vital signs in last 24 hours: Temp:  [97 F (36.1 C)-99.5 F (37.5 C)] 97.9 F (36.6 C) (02/07 0707) Pulse Rate:  [53-85] 73  (02/07 0707) Resp:  [15-39] 17  (02/07 0707) BP: (97-128)/(50-75) 128/75 mmHg (02/07 0707) SpO2:  [95 %-100 %] 99 % (02/07 0707) Weight:  [97.5 kg (214 lb 15.2 oz)] 97.5 kg (214 lb 15.2 oz) (02/07 0500)  Intake/Output from previous day: 02/06 0701 - 02/07 0700 In: 2400 [I.V.:2400] Out: 550 [Urine:250; Blood:300] Intake/Output this shift: Total I/O In: -  Out: 1500 [Urine:1500]  Wound:clean and dry  Lab Results: No results found for this basename: WBC:2,HGB:2,HCT:2,PLT:2 in the last 72 hours BMET No results found for this basename: NA:2,K:2,CL:2,CO2:2,GLUCOSE:2,BUN:2,CREATININE:2,CALCIUM:2 in the last 72 hours  Studies/Results: Dg Lumbar Spine 2-3 Views  06/22/2012  *RADIOLOGY REPORT*  Clinical Data: L4-5 laminectomy and fusion.  DG C-ARM 61-120 MIN, LUMBAR SPINE - 2-3 VIEW  Technique: Two fluoroscopic spot views of the lower lumbar spine are provided.  Comparison:  MRI lumbar spine 12/10/2011.  Findings: Provided images demonstrate a right lateral pedicle screws and stabilization bar at L3-4.  Interbody spacer is in place.  Paraspinous structures are unremarkable.  IMPRESSION: L3-4 transverse lumbar interbody fusion with pedicle screw fixation.   Original Report Authenticated By: Holley Dexter, M.D.    Dg C-arm [phone] Min  06/22/2012  *RADIOLOGY REPORT*  Clinical Data: L4-5 laminectomy and fusion.  DG C-ARM 61-120 MIN, LUMBAR SPINE - 2-3 VIEW  Technique: Two fluoroscopic spot views of the lower lumbar spine are provided.  Comparison:  MRI lumbar spine 12/10/2011.  Findings: Provided images demonstrate a right lateral pedicle screws and stabilization bar at L3-4.  Interbody spacer is in place.  Paraspinous structures are  unremarkable.  IMPRESSION: L3-4 transverse lumbar interbody fusion with pedicle screw fixation.   Original Report Authenticated By: Holley Dexter, M.D.     Assessment/Plan: Stable pod 1. Leg pain resolved. Wound dry. Will keep flat for 2 more days.   LOS: 1 day  as above   [name], MD 06/23/2012, 8:27 AM

## 2012-06-24 MED ORDER — BISACODYL 10 MG RE SUPP
10.0000 mg | Freq: Every day | RECTAL | Status: DC | PRN
  Administered 2012-06-24 – 2012-06-25 (×2): 10 mg via RECTAL
  Filled 2012-06-24 (×2): qty 1

## 2012-06-24 NOTE — Progress Notes (Signed)
Patient ID: Chad Huang, male   DOB: 1940-08-20, 72 y.o.   MRN: [id number] Subjective: Patient reports feeling better today  Objective: Vital signs in last 24 hours: Temp:  [98.2 F (36.8 C)-99.3 F (37.4 C)] 99.3 F (37.4 C) (02/08 0200) Pulse Rate:  [64-113] 76 (02/08 0200) Resp:  [16-20] 20 (02/08 0400) BP: (101-135)/(50-106) 135/81 mmHg (02/08 0200) SpO2:  [94 %-98 %] 94 % (02/08 0400)  Intake/Output from previous day: 02/07 0701 - 02/08 0700 In: 240 [P.O.:240] Out: 4150 [Urine:4150] Intake/Output this shift:    Wound:clean and dry; no drainage or swelling  Lab Results: No results found for this basename: WBC, HGB, HCT, PLT,  in the last 72 hours BMET No results found for this basename: NA, K, CL, CO2, GLUCOSE, BUN, CREATININE, CALCIUM,  in the last 72 hours  Studies/Results: Dg Lumbar Spine 2-3 Views  06/22/2012  *RADIOLOGY REPORT*  Clinical Data: L4-5 laminectomy and fusion.  DG C-ARM 61-120 MIN, LUMBAR SPINE - 2-3 VIEW  Technique: Two fluoroscopic spot views of the lower lumbar spine are provided.  Comparison:  MRI lumbar spine 12/10/2011.  Findings: Provided images demonstrate a right lateral pedicle screws and stabilization bar at L3-4.  Interbody spacer is in place.  Paraspinous structures are unremarkable.  IMPRESSION: L3-4 transverse lumbar interbody fusion with pedicle screw fixation.   Original Report Authenticated By: Holley Dexter, M.D.    Dg C-arm [phone] Min  06/22/2012  *RADIOLOGY REPORT*  Clinical Data: L4-5 laminectomy and fusion.  DG C-ARM 61-120 MIN, LUMBAR SPINE - 2-3 VIEW  Technique: Two fluoroscopic spot views of the lower lumbar spine are provided.  Comparison:  MRI lumbar spine 12/10/2011.  Findings: Provided images demonstrate a right lateral pedicle screws and stabilization bar at L3-4.  Interbody spacer is in place.  Paraspinous structures are unremarkable.  IMPRESSION: L3-4 transverse lumbar interbody fusion with pedicle screw fixation.   Original  Report Authenticated By: Holley Dexter, M.D.     Assessment/Plan: Had a fair amount of pain yesterday. Doing better today. Will keep flat til tomorrow am and then start to increase activity slowly.   LOS: 2 days  as above   [name], MD 06/24/2012, 8:37 AM

## 2012-06-24 NOTE — Progress Notes (Signed)
Pt is having some bloating and he vomited twice this afternoon, also he is not passing gas. He still has mild bowel sound. Pt been restless most of the time. Informed doctor on call, and received the orders to keep the pt NPO for now with IV fluids increased to 125 ml/hr, dulcolax suppository and continue foley cath. Will keep close monitoring.

## 2012-06-25 NOTE — Evaluation (Signed)
Physical Therapy Evaluation Patient Details Name: Chad Huang MRN: [id number] DOB: May 06, 1941 Today's Date: 06/25/2012 Time: 4098-1191 PT Time Calculation (min): 24 min  PT Assessment / Plan / Recommendation Clinical Impression  pt rpesetns with L4-5 Fusion.  pt anxious for OOB mobility, though needs consistent cueing for safety and attending to task.  pt indicates meds have been making him feel drunk.  Anticipate pt to make good progress as cognition clears.      PT Assessment  Patient needs continued PT services    Follow Up Recommendations  Home health PT;Supervision/Assistance - 24 hour    Does the patient have the potential to tolerate intense rehabilitation      Barriers to Discharge None      Equipment Recommendations  Rolling walker with 5" wheels    Recommendations for Other Services OT consult   Frequency Min 5X/week    Precautions / Restrictions Precautions Precautions: Fall;Back Precaution Booklet Issued: Yes (comment) Restrictions Weight Bearing Restrictions: No   Pertinent Vitals/Pain Indicates minimal pain.        Mobility  Bed Mobility Bed Mobility: Rolling Right;Right Sidelying to Sit;Sitting - Scoot to Edge of Bed Rolling Right: 4: Min guard Right Sidelying to Sit: 4: Min assist Sitting - Scoot to Edge of Bed: 5: Supervision Details for Bed Mobility Assistance: cues for log roll, back precautions.   Transfers Transfers: Sit to Stand;Stand to Sit Sit to Stand: 4: Min guard;With upper extremity assist;From bed Stand to Sit: 4: Min guard;With upper extremity assist;To chair/3-in-1;With armrests Details for Transfer Assistance: cues for UE use, controlling descent to chair.   Ambulation/Gait Ambulation/Gait Assistance: 4: Min guard Ambulation Distance (Feet): 200 Feet Assistive device: Rolling walker Ambulation/Gait Assistance Details: cues for upright posture, positioning in RW, attending to task.   Gait Pattern: Step-through pattern;Decreased  stride length Stairs: No Wheelchair Mobility Wheelchair Mobility: No    Exercises     PT Diagnosis: Difficulty walking  PT Problem List: Decreased activity tolerance;Decreased balance;Decreased mobility;Decreased cognition;Decreased knowledge of use of DME;Decreased knowledge of precautions PT Treatment Interventions: DME instruction;Gait training;Stair training;Functional mobility training;Therapeutic activities;Therapeutic exercise;Balance training;Cognitive remediation;Patient/family education   PT Goals Acute Rehab PT Goals PT Goal Formulation: With patient Time For Goal Achievement: 07/09/12 Potential to Achieve Goals: Good Pt will Roll Supine to Right Side: with modified independence PT Goal: Rolling Supine to Right Side - Progress: Goal set today Pt will Roll Supine to Left Side: with modified independence PT Goal: Rolling Supine to Left Side - Progress: Goal set today Pt will go Supine/Side to Sit: with modified independence PT Goal: Supine/Side to Sit - Progress: Goal set today Pt will go Sit to Supine/Side: with modified independence PT Goal: Sit to Supine/Side - Progress: Goal set today Pt will go Sit to Stand: with modified independence PT Goal: Sit to Stand - Progress: Goal set today Pt will go Stand to Sit: with modified independence PT Goal: Stand to Sit - Progress: Goal set today Pt will Ambulate: >150 feet;with modified independence;with rolling walker PT Goal: Ambulate - Progress: Goal set today Additional Goals Additional Goal #1: pt to verbalize and follow back precautions.   PT Goal: Additional Goal #1 - Progress: Goal set today  Visit Information  Last PT Received On: 06/25/12 Assistance Needed: +1    Subjective Data  Subjective: I'd do better if I was n't so drunk.   Patient Stated Goal: Back to work.     Prior Functioning  Home Living Lives With: Spouse Available Help at Discharge:  Family;Available 24 hours/day Type of Home: House Home Access:  Level entry Home Layout: One level Bathroom Shower/Tub: Engineer, manufacturing systems: Standard Bathroom Accessibility: Yes How Accessible: Accessible via walker Home Adaptive Equipment: Bedside commode/3-in-1;Tub transfer bench (Lift chair) Additional Comments: pt's wife has a RW from previous surgery, however unsure if it is youth size or regular size.   Prior Function Level of Independence: Independent Able to Take Stairs?: Yes Driving: Yes Vocation: Full time employment Communication Communication: No difficulties    Cognition  Cognition Overall Cognitive Status: Impaired Area of Impairment: Attention;Memory;Safety/judgement Arousal/Alertness: Awake/alert Orientation Level: Disoriented to;Time Behavior During Session: WFL for tasks performed Current Attention Level: Selective Memory Deficits: Decreased STM Safety/Judgement: Decreased safety judgement for tasks assessed;Impulsive;Decreased awareness of need for assistance Cognition - Other Comments: pt c/o feeling drunk from his pain meds.      Extremity/Trunk Assessment Right Lower Extremity Assessment RLE ROM/Strength/Tone: WFL for tasks assessed RLE Sensation: WFL - Light Touch Left Lower Extremity Assessment LLE ROM/Strength/Tone: WFL for tasks assessed LLE Sensation: WFL - Light Touch Trunk Assessment Trunk Assessment: Normal   Balance Balance Balance Assessed: No  End of Session PT - End of Session Equipment Utilized During Treatment: Gait belt Activity Tolerance: Patient tolerated treatment well Patient left: in chair;with call bell/phone within reach;with family/visitor present Nurse Communication: Mobility status  GP     Sunny Schlein, Alexis [phone] 06/25/2012, 2:53 PM

## 2012-06-25 NOTE — Progress Notes (Signed)
Subjective: Patient yesterday developed difficulties with abdominal distention and vomiting. He was made n.p.o., and IV fluids were increased. He was given to collapse suppository. His Foley catheter has been since removed, and he is voiding. He is also beginning to pass some gas, and has some appetite.  Objective: Vital signs in last 24 hours: Filed Vitals:   06/24/12 1700 06/24/12 2151 06/25/12 0317 06/25/12 0708  BP: 119/68 160/87 140/82 125/73  Pulse: 65 72 68 63  Temp: 98.2 F (36.8 C) 98 F (36.7 C) 98.4 F (36.9 C) 98.6 F (37 C)  TempSrc: Oral Oral Oral Oral  Resp: 20 20 20 19   Height:      Weight:      SpO2: 94% 97% 95% 97%    Intake/Output from previous day: 02/08 0701 - 02/09 0700 In: 30 [P.O.:30] Out: -  Intake/Output this shift:    Physical Exam:  Wound clean and dry. Abdomen nontender.   Assessment/Plan: Encouraged to ambulate in the halls. Will begin clear liquids, if he does well with them we'll decrease IV fluids to 50 cc per hour. We'll DC PCA (is not using).  Hewitt Shorts, MD 06/25/2012, 10:29 AM

## 2012-06-25 NOTE — Progress Notes (Signed)
Abdominal distention persists and is soft to touch, bowel sounds present in all four quads, passing gas, bowel movement small. Removed foley catheter, emptied 1800 cc of urine. Sat up on side of bed offered to assist to bathroom but refused. Complain of back pain and medicated as per order. Laid back down in bed resting quietly

## 2012-06-25 NOTE — Progress Notes (Signed)
Pt had a better day, is on clear liquid diet and not nauseas . Bowel sound is good in all quadrents, abdomen is more soft but his appetite is poor. Passing gas often and had a small BM, gave a dulcolax suppository this evening. He walked in the hall way and been up to bathroom many times, voiding fine. Pain is in control with analgesics. No concerns.  Chad Huang 06/25/12 .

## 2012-06-26 MED ORDER — DIAZEPAM 5 MG PO TABS: 5.0000 mg | ORAL_TABLET | Freq: Four times a day (QID) | ORAL | Status: DC | PRN

## 2012-06-26 MED ORDER — HYDROCODONE-ACETAMINOPHEN 5-325 MG PO TABS: 1.0000 | ORAL_TABLET | ORAL | Status: DC | PRN

## 2012-06-26 NOTE — Progress Notes (Signed)
Physical Therapy Treatment Patient Details Name: Chad Huang MRN: [id number] DOB: July 03, 1940 Today's Date: 06/26/2012 Time: 4098-1191 PT Time Calculation (min): 17 min  PT Assessment / Plan / Recommendation Comments on Treatment Session  Pt making steady progress with mobility-- no physical (A) needed but requires cueing throughout to reinforce back precautions especially "No twisting".      Follow Up Recommendations  Home health PT;Supervision/Assistance - 24 hour     Does the patient have the potential to tolerate intense rehabilitation     Barriers to Discharge        Equipment Recommendations  Rolling walker with 5" wheels    Recommendations for Other Services OT consult  Frequency Min 5X/week   Plan Discharge plan remains appropriate    Precautions / Restrictions Precautions Precautions: Fall;Back Precaution Comments: Pt unable to recall any of the back precautions.  Re-educated pt on all 3.   Restrictions Weight Bearing Restrictions: No       Mobility  Bed Mobility Bed Mobility: Rolling Left;Left Sidelying to Sit;Sitting - Scoot to Edge of Bed Rolling Left: 5: Supervision;With rail Left Sidelying to Sit: 5: Supervision;HOB flat Details for Bed Mobility Assistance: Cues to reinforce back precautions.   Transfers Transfers: Sit to Stand;Stand to Sit Sit to Stand: 4: Min guard;With upper extremity assist;From bed Stand to Sit: 5: Supervision;With upper extremity assist;With armrests;To chair/3-in-1 Details for Transfer Assistance: Cues to reinforce back precautions, & safest hand placement Ambulation/Gait Ambulation/Gait Assistance: 5: Supervision Ambulation Distance (Feet): 300 Feet Assistive device: Rolling walker Ambulation/Gait Assistance Details: Cues to reinforce no twisting when distracted.   Gait Pattern: Step-through pattern;Decreased stride length Stairs: No Wheelchair Mobility Wheelchair Mobility: No      PT Goals Acute Rehab PT Goals Time For  Goal Achievement: 07/09/12 Potential to Achieve Goals: Good Pt will Roll Supine to Right Side: with modified independence PT Goal: Rolling Supine to Right Side - Progress: Progressing toward goal Pt will Roll Supine to Left Side: with modified independence PT Goal: Rolling Supine to Left Side - Progress: Progressing toward goal Pt will go Supine/Side to Sit: with modified independence PT Goal: Supine/Side to Sit - Progress: Progressing toward goal Pt will go Sit to Supine/Side: with modified independence Pt will go Sit to Stand: with modified independence PT Goal: Sit to Stand - Progress: Progressing toward goal Pt will go Stand to Sit: with modified independence PT Goal: Stand to Sit - Progress: Progressing toward goal Pt will Ambulate: >150 feet;with modified independence;with rolling walker PT Goal: Ambulate - Progress: Progressing toward goal Additional Goals Additional Goal #1: pt to verbalize and follow back precautions.   PT Goal: Additional Goal #1 - Progress: Not met  Visit Information  Last PT Received On: 06/26/12 Assistance Needed: +1    Subjective Data      Cognition  Cognition Overall Cognitive Status: Impaired Area of Impairment: Memory Arousal/Alertness: Awake/alert Behavior During Session: WFL for tasks performed Memory: Decreased recall of precautions    Balance     End of Session PT - End of Session Equipment Utilized During Treatment: Gait belt Activity Tolerance: Patient tolerated treatment well Patient left: in chair;with call bell/phone within reach Nurse Communication: Mobility status     Verdell Face, [place] [phone] 06/26/2012

## 2012-06-26 NOTE — Care Management Note (Signed)
    Page 1 of 1   06/26/2012     1:07:47 PM   CARE MANAGEMENT NOTE 06/26/2012  Patient:  Chad Huang, Chad Huang   Account Number:  [id number]  Date Initiated:  06/22/2012  Documentation initiated by:  [facility]  Subjective/Objective Assessment:   admitted postop L4-5 PLIF     Action/Plan:   PT/OT evals-recommended HHPT and rolling wlaker   Anticipated DC Date:  06/26/2012   Anticipated DC Plan:  HOME/SELF CARE      DC Planning Services  CM consult      Choice offered to / List presented to:     DME arranged  Levan Hurst      DME agency  [facility]        Status of service:  Completed, signed off Medicare Important Message given?   (If response is "NO", the following Medicare IM given date fields will be blank) Date Medicare IM given:   Date Additional Medicare IM given:    Discharge Disposition:  HOME/SELF CARE  Per UR Regulation:  Reviewed for med. necessity/level of care/duration of stay  If discussed at Long Length of Stay Meetings, dates discussed:    Comments:  07/24/12 Patient received rolling walker from [facility] prior to discharge. MD did not wish to order HHC. Jacquelynn Cree RN, BSN, CCM

## 2012-06-26 NOTE — Discharge Summary (Signed)
  Physician Discharge Summary  Patient ID: JHORDAN MCKIBBEN MRN: [id number] DOB/AGE: 1940/10/12 72 y.o.  Admit date: 06/22/2012 Discharge date: 06/26/2012  Admission Diagnoses:  Discharge Diagnoses:  Active Problems:   * No active hospital problems. *   Discharged Condition: good  Hospital Course: Surgery last Thursday. Did well. CSF noted at surgery. Kept flat for 3 days. Then activity advanced. Pain better. Wound fine. No evidence of spinal fluid. No headache. Home pod 4. Specific instructions given.  Consults: None  Significant Diagnostic Studies: None  Treatments: surgery: L 45 mastlif  Discharge Exam: Blood pressure 112/63, pulse 60, temperature 98.5 F (36.9 C), temperature source Oral, resp. rate 16, height 5\' 10"  (1.778 m), weight 97.5 kg (214 lb 15.2 oz), SpO2 98.00%. Incision/Wound:clean and dry. Neuro stable  Disposition:      Medication List    ASK your doctor about these medications       Fish Oil 1000 MG Caps  Take 1 capsule by mouth daily.     fluticasone 50 MCG/ACT nasal spray  Commonly known as:  FLONASE  Place 1 spray into the nose at bedtime.     meloxicam 15 MG tablet  Commonly known as:  MOBIC  Take 15 mg by mouth daily.     nebivolol 10 MG tablet  Commonly known as:  BYSTOLIC  Take 10 mg by mouth every evening.     omeprazole 20 MG tablet  Commonly known as:  PRILOSEC OTC  Take 20 mg by mouth daily.     saw palmetto 80 MG capsule  Take 80 mg by mouth daily. Does not specified     traMADol 50 MG tablet  Commonly known as:  ULTRAM  Take 50 mg by mouth every 6 (six) hours as needed. For pain         At home rest most of the time. Get up 9 or 10 times each day and take a 15 or 20 minute walk. No riding in the car and to your first postoperative appointment. If you have neck surgery you may shower from the chest down starting on the third postoperative day. If you had back surgery he may start showering on the third postoperative day with  saran wrap wrapped around your incisional area 3 times. After the shower remove the saran wrap. Take pain medicine as needed and other medications as instructed. Call my office for an appointment.  SignedReinaldo Meeker, MD 06/26/2012, 9:47 AM

## 2013-06-20 ENCOUNTER — Ambulatory Visit
Admission: RE | Admit: 2013-06-20 | Discharge: 2013-06-20 | Disposition: A | Discharge status: Home / Self Care | Payer: Medicare Other | Source: Ambulatory Visit | Attending: Nurse Practitioner | Admitting: Nurse Practitioner

## 2013-06-20 ENCOUNTER — Other Ambulatory Visit: Payer: Self-pay | Admitting: Nurse Practitioner

## 2013-06-20 DIAGNOSIS — R0989 Other specified symptoms and signs involving the circulatory and respiratory systems: Secondary | ICD-10-CM

## 2013-06-20 DIAGNOSIS — R079 Chest pain, unspecified: Secondary | ICD-10-CM

## 2015-09-24 ENCOUNTER — Encounter: Payer: Self-pay | Admitting: Gastroenterology

## 2015-09-24 ENCOUNTER — Ambulatory Visit (INDEPENDENT_AMBULATORY_CARE_PROVIDER_SITE_OTHER): Payer: Medicare Other | Admitting: Gastroenterology

## 2015-09-24 VITALS — BP 130/80 | HR 82 | Ht 70.5 in | Wt 209.0 lb

## 2015-09-24 DIAGNOSIS — Z1212 Encounter for screening for malignant neoplasm of rectum: Secondary | ICD-10-CM

## 2015-09-24 DIAGNOSIS — Z1211 Encounter for screening for malignant neoplasm of colon: Secondary | ICD-10-CM | POA: Diagnosis not present

## 2015-09-24 DIAGNOSIS — K219 Gastro-esophageal reflux disease without esophagitis: Secondary | ICD-10-CM | POA: Diagnosis not present

## 2015-09-24 MED ORDER — OMEPRAZOLE 40 MG PO CPDR: 40.0000 mg | DELAYED_RELEASE_CAPSULE | Freq: Every day | ORAL | Status: DC

## 2015-09-24 NOTE — Patient Instructions (Addendum)
We have sent the following medications to your pharmacy for you to pick up at your convenience:omeprazole.   Patient advised to avoid spicy, acidic, citrus, chocolate, mints, fruit and fruit juices.  Limit the intake of caffeine, alcohol and Soda.  Don't exercise too soon after eating.  Don't lie down within 3-4 hours of eating.  Elevate the head of your bed.  Thank you for choosing me and [facility].  Pricilla Riffle. Dagoberto Ligas., MD., Marval Regal  cc: Delia Chimes, NP

## 2015-09-24 NOTE — Progress Notes (Signed)
    History of Present Illness: This is a 75 year old male self referred for the evaluation of GERD. He has a long history of GERD. Previously underwent EGD in 2001 and again in 2005. Antral nodules were biopsied showing a hyperplastic polyps. Small hiatal hernia was also noted. He states his reflux was well controlled for many years on omeprazole 20 mg daily. Around October or November 2016 his reflux symptoms became more difficult to control. Tried over-the-counter Zantac and over-the-counter Nexium without good control. Previously drinking 4-5 soft drinks per day and he quit drinking soft drinks about 2 weeks ago and his reflux symptoms have improved. Blood work performed in April 2017 showed a normal chemistry panel, normal CBC and normal TSH. Colonoscopy in May 2005 revealed 2 small hyperplastic colon polyps and internal hemorrhoids. Denies weight loss, abdominal pain, constipation, diarrhea, change in stool caliber, melena, hematochezia, nausea, vomiting, dysphagia, chest pain.  Rev iew of Systems: Pertinent positive and negative review of systems were noted in the above HPI section. All other review of systems were otherwise negative.  Current Medications, Allergies, Past Medical History, Past Surgical History, Family History and Social History were reviewed in [employer] record.  Physical Exam: General: Well developed, well nourished, no acute distress Head: Normocephalic and atraumatic Eyes:  sclerae anicteric, EOMI Ears: Normal auditory acuity Mouth: No deformity or lesions Neck: Supple, no masses or thyromegaly Lungs: Clear throughout to auscultation Heart: Regular rate and rhythm; no murmurs, rubs or bruits Abdomen: Soft, non tender and non distended. No masses, hepatosplenomegaly or hernias noted. Normal Bowel sounds Musculoskeletal: Symmetrical with no gross deformities  Skin: No lesions on visible extremities Pulses:  Normal pulses noted Extremities: No  clubbing, cyanosis, edema or deformities noted Neurological: Alert oriented x 4, grossly nonfocal Cervical Nodes:  No significant cervical adenopathy Inguinal Nodes: No significant inguinal adenopathy Psychological:  Alert and cooperative. Normal mood and affect  Assessment and Recommendations:  1. GERD. Intensify all antireflux measures. Discontinue other acid reflux medications and begin omeprazole 40 mg daily. Return office visit in 6 weeks. Consider EGD of PPI bid if symptoms not well controlled.   2. CRC screening average risk. He is due for colorectal cancer screening. Discuss colonoscopy at return office visit to couple colonoscopy with EGD if EGD is needed.

## 2015-11-13 ENCOUNTER — Encounter: Payer: Self-pay | Admitting: Gastroenterology

## 2015-11-13 ENCOUNTER — Ambulatory Visit (INDEPENDENT_AMBULATORY_CARE_PROVIDER_SITE_OTHER): Payer: Medicare Other | Admitting: Gastroenterology

## 2015-11-13 VITALS — BP 152/80 | HR 68 | Ht 70.0 in | Wt 216.8 lb

## 2015-11-13 DIAGNOSIS — Z1212 Encounter for screening for malignant neoplasm of rectum: Secondary | ICD-10-CM

## 2015-11-13 DIAGNOSIS — Z1211 Encounter for screening for malignant neoplasm of colon: Secondary | ICD-10-CM | POA: Diagnosis not present

## 2015-11-13 DIAGNOSIS — K219 Gastro-esophageal reflux disease without esophagitis: Secondary | ICD-10-CM

## 2015-11-13 NOTE — Patient Instructions (Signed)
You need to be scheduled for an endoscopy and colonoscopy. Please call back to set up appointment after reviewing your schedule.

## 2015-11-13 NOTE — Progress Notes (Signed)
    History of Present Illness: This is a 75 year old male returning for follow-up of GERD. He has begun the use of 6 inch bed blocks, modified his diet, began taking omeprazole 40 mg daily and begin taking a daily probiotic. His reflux symptoms have completely resolved.  Current Medications, Allergies, Past Medical History, Past Surgical History, Family History and Social History were reviewed in [employer] record.  Physical Exam: General: Well developed, well nourished, no acute distress Head: Normocephalic and atraumatic Eyes:  sclerae anicteric, EOMI Ears: Normal auditory acuity Mouth: No deformity or lesions Lungs: Clear throughout to auscultation Heart: Regular rate and rhythm; no murmurs, rubs or bruits Abdomen: Soft, non tender and non distended. No masses, hepatosplenomegaly or hernias noted. Normal Bowel sounds Musculoskeletal: Symmetrical with no gross deformities  Pulses:  Normal pulses noted Extremities: No clubbing, cyanosis, edema or deformities noted Neurological: Alert oriented x 4, grossly nonfocal Psychological:  Alert and cooperative. Normal mood and affect  Assessment and Recommendations:  1. GERD. Continue omeprazole 40 mg daily, probiotic daily, standard antireflux measures and 6 inch bed blocks. Rule out erosive esophagitis and Barrett's esophagus. Schedule EGD. The risks (including bleeding, perforation, infection, missed lesions, medication reactions and possible hospitalization or surgery if complications occur), benefits, and alternatives to endoscopy with possible biopsy and possible dilation were discussed with the patient and they consent to proceed.   2. CRC screening, average risk. He is due for colorectal cancer screening. Schedule colonoscopy. The risks (including bleeding, perforation, infection, missed lesions, medication reactions and possible hospitalization or surgery if complications occur), benefits, and alternatives to  colonoscopy with possible biopsy and possible polypectomy were discussed with the patient and they consent to proceed.

## 2016-09-08 ENCOUNTER — Other Ambulatory Visit: Payer: Self-pay | Admitting: Gastroenterology

## 2016-09-20 ENCOUNTER — Encounter: Payer: Self-pay | Admitting: Primary Care

## 2016-09-20 ENCOUNTER — Ambulatory Visit (INDEPENDENT_AMBULATORY_CARE_PROVIDER_SITE_OTHER): Payer: Medicare Other | Admitting: Primary Care

## 2016-09-20 VITALS — BP 134/82 | HR 68 | Temp 97.8°F | Ht 70.0 in | Wt 215.8 lb

## 2016-09-20 DIAGNOSIS — K219 Gastro-esophageal reflux disease without esophagitis: Secondary | ICD-10-CM

## 2016-09-20 DIAGNOSIS — E785 Hyperlipidemia, unspecified: Secondary | ICD-10-CM | POA: Diagnosis not present

## 2016-09-20 DIAGNOSIS — I10 Essential (primary) hypertension: Secondary | ICD-10-CM | POA: Diagnosis not present

## 2016-09-20 DIAGNOSIS — C61 Malignant neoplasm of prostate: Secondary | ICD-10-CM | POA: Diagnosis not present

## 2016-09-20 DIAGNOSIS — H9190 Unspecified hearing loss, unspecified ear: Secondary | ICD-10-CM | POA: Diagnosis not present

## 2016-09-20 DIAGNOSIS — E782 Mixed hyperlipidemia: Secondary | ICD-10-CM

## 2016-09-20 LAB — COMPREHENSIVE METABOLIC PANEL
ALT: 12 U/L (ref 0–53)
AST: 14 U/L (ref 0–37)
Albumin: 4.3 g/dL (ref 3.5–5.2)
Alkaline Phosphatase: 64 U/L (ref 39–117)
BUN: 13 mg/dL (ref 6–23)
CO2: 26 meq/L (ref 19–32)
CREATININE: 1.08 mg/dL (ref 0.40–1.50)
Calcium: 9 mg/dL (ref 8.4–10.5)
Chloride: 109 mEq/L (ref 96–112)
GFR: 70.74 mL/min (ref >60.00)
GLUCOSE: 98 mg/dL (ref 70–99)
Potassium: 4.3 mEq/L (ref 3.5–5.1)
SODIUM: 142 meq/L (ref 135–145)
Total Bilirubin: 0.5 mg/dL (ref 0.2–1.2)
Total Protein: 6.2 g/dL (ref 6.0–8.3)

## 2016-09-20 LAB — PSA: PSA: 5.07 ng/mL — ABNORMAL HIGH (ref 0.10–4.00)

## 2016-09-20 LAB — LIPID PANEL
CHOL/HDL RATIO: 3
Cholesterol: 117 mg/dL (ref 0–200)
HDL: 34.9 mg/dL — AB (ref >39.00)
LDL Cholesterol: 60 mg/dL (ref 0–99)
NONHDL: 81.74
Triglycerides: 109 mg/dL (ref 0.0–149.0)
VLDL: 21.8 mg/dL (ref 0.0–40.0)

## 2016-09-20 NOTE — Progress Notes (Signed)
Subjective:    Patient ID: Chad Huang, male    DOB: Dec 11, 1940, 76 y.o.   MRN: [id number]  HPI  Chad Huang is a 76 year old male who presents today to establish care and discuss the problems mentioned below. Will obtain old records. He is due for his physical.   1) Hyperlipidemia: Diagnosed years ago. Currently managed on Simvastatin 40 mg. His last cholesterol check was over one year ago.   2) GERD: Currently following with Dr. Fuller Plan through GI. Currently managed on omeprazole 40 mg. Without his medication his symptoms return and are quite bothersome.  3) Prostate Cancer: Diagnosed 6 years ago. Currently following with Dr. Jeffie Pollock through Urology. His recent PSA level was 4.9 in April 2018. He is due for follow up in early July 2018. He would like his PSA rechecked today.  4) Essential Hypertension: Currently managed on Toprol XL 50 mg. He denies chest pain, dizziness, shortness of breath. His BP in the office today is 134/82.  5) Chronic Back Pain: History of two back surgeries per Dr. Hal Neer, doing well. He has no complaints of back pain today and is very active most days. He doesn't take anything prescription strength or over-the-counter for his pain as it is well-controlled with physical activity.  6) Ear Pain: Located to the right ear that he first noticed last week. He denies sore throat, cough, fevers.   Review of Systems  Constitutional: Negative for chills and fever.  HENT: Positive for ear pain. Negative for congestion.   Eyes: Negative for visual disturbance.  Respiratory: Negative for shortness of breath.   Cardiovascular: Negative for chest pain.  Gastrointestinal:       Stable acid reflux  Genitourinary:       Prostate cancer  Neurological: Negative for dizziness and headaches.       Past Medical History:  Diagnosis Date  . Alcohol abuse   . Anxiety and depression    charter, admission  . Back pain    (Kritzer), microdiskectomy-great result  . Benign  prostatic hypertrophy    per Dr Jeffie Pollock with uro  . Cataracts, bilateral   . Cerumen impaction    left  . Chest pain   . GERD (gastroesophageal reflux disease)   . Hearing loss   . Hiatal hernia    nodule  . HTN (hypertension)   . Hyperlipidemia, mixed   . Inguinal hernia    left, hx of, repair  . Internal hemorrhoids   . Left ear pain   . Muscle disorder    disorder, muscle/ligaminet nis, rt lower extremity  . Prostate cancer [facility]) 2010   Dr Jeffie Pollock  . Skin cancer    hx, left ear     Social History   Social History  . Marital status: Married    Spouse name: N/A  . Number of children: N/A  . Years of education: N/A   Occupational History  . Not on file.   Social History Main Topics  . Smoking status: Former Smoker    Packs/day: 1.00    Years: 20.00    Types: Cigarettes  . Smokeless tobacco: Current User    Types: Chew     Comment: quit in 1973  . Alcohol use No     Comment: none since 1996  . Drug use: No  . Sexual activity: Not on file   Other Topics Concern  . Not on file   Social History Narrative   Married,    One daughter.  1 granddaughter.   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      [phone] (c?)    Past Surgical History:  Procedure Laterality Date  . back microdiskectomy  10/01   Kritzer  . BACK SURGERY    . CARDIOVASCULAR STRESS TEST  2009   [place]  . COLONOSCOPY  5/05   polyps int hemorrhoids, repeat 10 yrs  . ESOPHAGOGASTRODUODENOSCOPY  03/02/00   gastritis, chronic  . ESOPHAGOGASTRODUODENOSCOPY  5/05   polyps in antrum, neg H Pylori  . ETT  05/19/07   Adenosine Muoview  . H Pylori positive  8/98  . HERNIA REPAIR    . LAPAROSCOPIC INGUINAL HERNIA REPAIR  10/96  . levitin eval  02/05/99   hip pain  . LUMBAR SPINE SURGERY  5/00  . MRI  10/07/98   degen changes 9Dr. French Ana) steroid injections  . MYELOGRAM  9/01   stenosis L4-5    Family History  Problem Relation Age of Onset  . Prostate cancer Father   . Stroke       grandmother  . Lung cancer Sister   . Heart disease Brother     Allergies  Allergen Reactions  . Clarithromycin     REACTION: unspecified  . Codeine Phosphate     REACTION: nausea  . Nabumetone     REACTION: Muscle cramps    Current Outpatient Prescriptions on File Prior to Visit  Medication Sig Dispense Refill  . aspirin 81 MG tablet Take 81 mg by mouth daily.    . fluticasone (FLONASE) 50 MCG/ACT nasal spray Place 1 spray into the nose at bedtime.    . metoprolol succinate (TOPROL-XL) 50 MG 24 hr tablet Take 50 mg by mouth daily. Take with or immediately following a meal.    . Multiple Vitamins-Minerals (MULTIVITAMIN ADULT PO) Take by mouth daily.    Marland Kitchen omeprazole (PRILOSEC) 40 MG capsule TAKE ONE CAPSULE BY MOUTH EVERY DAY 30 capsule 1  . simvastatin (ZOCOR) 40 MG tablet Take 40 mg by mouth daily.     No current facility-administered medications on file prior to visit.     BP 134/82   Pulse 68   Temp 97.8 F (36.6 C) (Oral)   Ht 5\' 10"  (1.778 m)   Wt 215 lb 12.8 oz (97.9 kg)   BMI 30.96 kg/m    Objective:   Physical Exam  Constitutional: He is oriented to person, place, and time. He appears well-nourished.  HENT:  Right Ear: Tympanic membrane and ear canal normal.  Left Ear: Tympanic membrane and ear canal normal.  Neck: Neck supple.  Cardiovascular: Normal rate and regular rhythm.   Pulmonary/Chest: Effort normal and breath sounds normal. He has no wheezes. He has no rales.  Neurological: He is alert and oriented to person, place, and time.  Skin: Skin is warm and dry.  Psychiatric: He has a normal mood and affect.          Assessment & Plan:

## 2016-09-20 NOTE — Assessment & Plan Note (Signed)
Blood pressure stable on Toprol 50 mg. Continue current regimen.

## 2016-09-20 NOTE — Assessment & Plan Note (Signed)
Symptoms stable on omeprazole 40 mg. Follows with GI annually for reevaluation.

## 2016-09-20 NOTE — Progress Notes (Signed)
Pre visit review using our clinic review tool, if applicable. No additional management support is needed unless otherwise documented below in the visit note. 

## 2016-09-20 NOTE — Assessment & Plan Note (Signed)
Currently following with urology. Repeat PSA level today per patient request, he will be following up with urology in early July. Last PSA of 4.9 in April 2018.

## 2016-09-20 NOTE — Assessment & Plan Note (Signed)
Managed on simvastatin 40 mg. Due for repeat lipids today, LFTs and lipids pending.

## 2016-09-20 NOTE — Patient Instructions (Signed)
Complete lab work prior to leaving today. I will notify you of your results once received.   Schedule your physical up front at your convenience.  It was a pleasure to meet you today! Please don't hesitate to call me with any questions. Welcome to [facility]!

## 2016-09-20 NOTE — Assessment & Plan Note (Signed)
Moderate difficulty in hearing today during exam. ENT exam unremarkable today, no evidence of cerumen impaction or infection. Continue to monitor.

## 2016-09-21 ENCOUNTER — Encounter: Payer: Self-pay | Admitting: *Deleted

## 2016-09-22 ENCOUNTER — Ambulatory Visit (INDEPENDENT_AMBULATORY_CARE_PROVIDER_SITE_OTHER): Payer: Medicare Other | Admitting: Primary Care

## 2016-09-22 ENCOUNTER — Ambulatory Visit (INDEPENDENT_AMBULATORY_CARE_PROVIDER_SITE_OTHER): Payer: Medicare Other

## 2016-09-22 ENCOUNTER — Encounter: Payer: Self-pay | Admitting: Primary Care

## 2016-09-22 VITALS — BP 140/80 | HR 64 | Temp 97.5°F | Ht 69.0 in | Wt 210.5 lb

## 2016-09-22 DIAGNOSIS — C61 Malignant neoplasm of prostate: Secondary | ICD-10-CM

## 2016-09-22 DIAGNOSIS — E785 Hyperlipidemia, unspecified: Secondary | ICD-10-CM

## 2016-09-22 DIAGNOSIS — I10 Essential (primary) hypertension: Secondary | ICD-10-CM

## 2016-09-22 DIAGNOSIS — K648 Other hemorrhoids: Secondary | ICD-10-CM | POA: Diagnosis not present

## 2016-09-22 DIAGNOSIS — Z0000 Encounter for general adult medical examination without abnormal findings: Secondary | ICD-10-CM

## 2016-09-22 DIAGNOSIS — Z23 Encounter for immunization: Secondary | ICD-10-CM

## 2016-09-22 MED ORDER — SIMVASTATIN 40 MG PO TABS: 40.0000 mg | ORAL_TABLET | Freq: Every evening | ORAL | 3 refills | Status: DC

## 2016-09-22 NOTE — Progress Notes (Signed)
Pre visit review using our clinic review tool, if applicable. No additional management support is needed unless otherwise documented below in the visit note. 

## 2016-09-22 NOTE — Assessment & Plan Note (Signed)
Noted on colonoscopy from 2005. Denies recent rectal bleeding or discomfort.

## 2016-09-22 NOTE — Assessment & Plan Note (Signed)
Stable today, continue Toprol-XL.

## 2016-09-22 NOTE — Progress Notes (Signed)
Subjective:   Chad Huang is a 76 y.o. male who presents for Medicare Annual/Subsequent preventive examination.  Review of Systems:  N/A Cardiac Risk Factors include: advanced age (>50men, >6 women);male gender;obesity (BMI >30kg/m2);dyslipidemia;hypertension;smoking/ tobacco exposure     Objective:    Vitals: BP 140/80 (BP Location: Right Arm, Patient Position: Sitting, Cuff Size: Normal)   Pulse 64   Temp 97.5 F (36.4 C) (Oral)   Ht 5\' 9"  (1.753 m) Comment: no shoes  Wt 210 lb 8 oz (95.5 kg)   SpO2 97%   BMI 31.09 kg/m   Body mass index is 31.09 kg/m.  Tobacco History  Smoking Status  . Former Smoker  . Packs/day: 1.00  . Years: 20.00  . Types: Cigarettes  Smokeless Tobacco  . Current User  . Types: Chew    Comment: quit in 1973     Ready to quit: No Counseling given: No   Past Medical History:  Diagnosis Date  . Alcohol abuse   . Anxiety and depression    charter, admission  . Back pain    (Kritzer), microdiskectomy-great result  . Benign prostatic hypertrophy    per Dr Jeffie Pollock with uro  . Cataracts, bilateral   . Cerumen impaction    left  . Chest pain   . GERD (gastroesophageal reflux disease)   . Hearing loss   . Hiatal hernia    nodule  . HTN (hypertension)   . Hyperlipidemia, mixed   . Inguinal hernia    left, hx of, repair  . Internal hemorrhoids   . Left ear pain   . Muscle disorder    disorder, muscle/ligaminet nis, rt lower extremity  . Prostate cancer [facility]) 2010   Dr Jeffie Pollock  . Skin cancer    hx, left ear   Past Surgical History:  Procedure Laterality Date  . back microdiskectomy  10/01   Kritzer  . BACK SURGERY    . CARDIOVASCULAR STRESS TEST  2009   [place]  . COLONOSCOPY  5/05   polyps int hemorrhoids, repeat 10 yrs  . ESOPHAGOGASTRODUODENOSCOPY  03/02/00   gastritis, chronic  . ESOPHAGOGASTRODUODENOSCOPY  5/05   polyps in antrum, neg H Pylori  . ETT  05/19/07   Adenosine Muoview  . H Pylori positive  8/98  . HERNIA  REPAIR    . LAPAROSCOPIC INGUINAL HERNIA REPAIR  10/96  . levitin eval  02/05/99   hip pain  . LUMBAR SPINE SURGERY  5/00  . MRI  10/07/98   degen changes 9Dr. French Ana) steroid injections  . MYELOGRAM  9/01   stenosis L4-5   Family History  Problem Relation Age of Onset  . Prostate cancer Father   . Stroke      grandmother  . Lung cancer Sister   . Heart disease Brother    History  Sexual Activity  . Sexual activity: Not on file    Outpatient Encounter Prescriptions as of 09/22/2016  Medication Sig  . aspirin 81 MG tablet Take 81 mg by mouth daily.  . fluticasone (FLONASE) 50 MCG/ACT nasal spray Place 1 spray into the nose at bedtime.  . metoprolol succinate (TOPROL-XL) 50 MG 24 hr tablet Take 50 mg by mouth daily. Take with or immediately following a meal.  . Multiple Vitamins-Minerals (MULTIVITAMIN ADULT PO) Take by mouth daily.  Marland Kitchen omeprazole (PRILOSEC) 40 MG capsule TAKE ONE CAPSULE BY MOUTH EVERY DAY  . Saw Palmetto 450 MG CAPS Take 1 capsule by mouth daily.  . simvastatin (  ZOCOR) 40 MG tablet Take 40 mg by mouth daily.   No facility-administered encounter medications on file as of 09/22/2016.     Activities of Daily Living In your present state of health, do you have any difficulty performing the following activities: 09/22/2016  Hearing? Y  Vision? N  Difficulty concentrating or making decisions? N  Walking or climbing stairs? N  Dressing or bathing? N  Doing errands, shopping? N  Preparing Food and eating ? N  Using the Toilet? N  In the past six months, have you accidently leaked urine? Y  Do you have problems with loss of bowel control? N  Managing your Medications? N  Managing your Finances? N  Housekeeping or managing your Housekeeping? N  Some recent data might be hidden    Patient Care Team: [name], NP as PCP - General (Internal Medicine) Thelma Comp, Rodessa as Consulting Physician (Optometry) Ladene Artist, MD as Consulting Physician  (Gastroenterology) Rozetta Nunnery, MD as Consulting Physician (Otolaryngology) Irine Seal, MD as Attending Physician (Urology) Karie Chimera, MD as Referring Physician (Neurosurgery)   Assessment:     Hearing Screening   125Hz  250Hz  500Hz  1000Hz  2000Hz  3000Hz  4000Hz  6000Hz  8000Hz   Right ear:   0 0 0  0    Left ear:   0 0 0  40    Vision Screening Comments: Last vision exam in April 2018 with Dr. Maryruth Hancock B.    Exercise Activities and Dietary recommendations Current Exercise Habits: Home exercise routine, Type of exercise: calisthenics, Time (Minutes): 30, Frequency (Times/Week): 3, Weekly Exercise (Minutes/Week): 90, Intensity: Mild, Exercise limited by: None identified  Goals    . Increase physical activity          Starting 09/22/2016, I will continue to exercise at least 30 min 2-3 days per week.       Fall Risk Fall Risk  09/22/2016  Falls in the past year? No   Depression Screen PHQ 2/9 Scores 09/22/2016  PHQ - 2 Score 0    Cognitive Function MMSE - Mini Mental State Exam 09/22/2016  Orientation to time 5  Orientation to Place 5  Registration 3  Attention/ Calculation 5  Recall 3  Language- name 2 objects 0  Language- repeat 1  Language- follow 3 step command 3  Language- read & follow direction 0  Write a sentence 0  Copy design 0  Total score 25     PLEASE NOTE: A Mini-Cog screen was completed. Maximum score is 20. A value of 0 denotes this part of Folstein MMSE was not completed or the patient failed this part of the Mini-Cog screening.   Mini-Cog Screening Orientation to Time - Max 5 pts Orientation to Place - Max 5 pts Registration - Max 3 pts Recall - Max 3 pts Language Repeat - Max 1 pts Language Follow 3 Step Command - Max 3 pts     Immunization History  Administered Date(s) Administered  . Influenza-Unspecified 05/31/2016  . Td 06/17/1993, 09/18/2008   Screening Tests Health Maintenance  Topic Date Due  . PNA vac Low Risk Adult (1 of 2 -  PCV13) 09/22/2017 (Originally 03/07/2006)  . COLONOSCOPY  09/23/2026 (Originally 09/29/2013)  . INFLUENZA VACCINE  12/15/2016  . TETANUS/TDAP  09/19/2018      Plan:     I have personally reviewed and addressed the Medicare Annual Wellness questionnaire and have noted the following in the patient's chart:  A. Medical and social history B. Use of alcohol, tobacco  or illicit drugs  C. Current medications and supplements D. Functional ability and status E.  Nutritional status F.  Physical activity G. Advance directives H. List of other physicians I.  Hospitalizations, surgeries, and ER visits in previous 12 months J.  [place] to include hearing, vision, cognitive, depression L. Referrals and appointments - none  In addition, I have reviewed and discussed with patient certain preventive protocols, quality metrics, and best practice recommendations. A written personalized care plan for preventive services as well as general preventive health recommendations were provided to patient.  See attached scanned questionnaire for additional information.   Signed,   Lindell Noe, MHA, BS, LPN Health Coach

## 2016-09-22 NOTE — Patient Instructions (Signed)
You had a pneumonia vaccination today.   Follow up with your Urologist as scheduled.  Continue exercising. You should be getting 150 minutes of exercise weekly.  Increase consumption of vegetables, fruit, whole grains. Limit sodas, junk food.  Ensure you are consuming 64 ounces of water daily.  Follow up in 1 year for your annual exam or sooner if needed.  It was a pleasure to see you today!

## 2016-09-22 NOTE — Progress Notes (Signed)
Subjective:    Patient ID: Chad Huang, male    DOB: 07/07/1940, 76 y.o.   MRN: [id number]  HPI  Chad Huang is a 76 year old male who presents today for [place] Part 2. He met with our health advisor this morning.   Immunizations: -Tetanus: Unsure -Influenza: Completed last season -Pneumonia: Completed in 2010. Due for repeat. -Shingles: Unsure.   Diet: He endorses a healthy diet. Breakfast: Eggs, toast with jelly Lunch: Egg salad sandwich, peanut butter sandwich Dinner: Potatoes, vegetables, bread Snacks: None Desserts: Occasionally, snack cakes Beverages: Coffee, soda, water  Exercise: He lifts weighs occasionally at home. Eye exam: Completed in February 2018 Dental exam: Has not completed recently Colonoscopy: Completed in 2005, declines due to age. PSA: Completed, following with Urology   Review of Systems  Constitutional: Negative for unexpected weight change.  HENT: Negative for rhinorrhea.   Respiratory: Negative for cough and shortness of breath.   Cardiovascular: Negative for chest pain.  Gastrointestinal: Negative for constipation and diarrhea.  Genitourinary: Negative for difficulty urinating.  Musculoskeletal: Negative for arthralgias and myalgias.  Skin: Negative for rash.  Allergic/Immunologic: Negative for environmental allergies.  Neurological: Negative for dizziness, numbness and headaches.  Psychiatric/Behavioral:       Denies concerns for anxiety or depression       Past Medical History:  Diagnosis Date  . Alcohol abuse   . Anxiety and depression    charter, admission  . Back pain    (Kritzer), microdiskectomy-great result  . Benign prostatic hypertrophy    per Dr Jeffie Pollock with uro  . Cataracts, bilateral   . Cerumen impaction    left  . Chest pain   . GERD (gastroesophageal reflux disease)   . Hearing loss   . Hiatal hernia    nodule  . HTN (hypertension)   . Hyperlipidemia, mixed   . Inguinal hernia    left, hx of, repair  . Internal  hemorrhoids   . Left ear pain   . Muscle disorder    disorder, muscle/ligaminet nis, rt lower extremity  . Prostate cancer [facility]) 2010   Dr Jeffie Pollock  . Skin cancer    hx, left ear     Social History   Social History  . Marital status: Married    Spouse name: N/A  . Number of children: N/A  . Years of education: N/A   Occupational History  . Not on file.   Social History Main Topics  . Smoking status: Former Smoker    Packs/day: 1.00    Years: 20.00    Types: Cigarettes  . Smokeless tobacco: Current User    Types: Chew     Comment: quit in 1973  . Alcohol use No     Comment: none since 1996  . Drug use: No  . Sexual activity: Not on file   Other Topics Concern  . Not on file   Social History Narrative   Married,    One daughter.    1 granddaughter.   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      [phone] (c?)    Past Surgical History:  Procedure Laterality Date  . back microdiskectomy  10/01   Kritzer  . BACK SURGERY    . CARDIOVASCULAR STRESS TEST  2009   [place]  . COLONOSCOPY  5/05   polyps int hemorrhoids, repeat 10 yrs  . ESOPHAGOGASTRODUODENOSCOPY  03/02/00   gastritis, chronic  . ESOPHAGOGASTRODUODENOSCOPY  5/05   polyps in antrum,  neg H Pylori  . ETT  05/19/07   Adenosine Muoview  . H Pylori positive  8/98  . HERNIA REPAIR    . LAPAROSCOPIC INGUINAL HERNIA REPAIR  10/96  . levitin eval  02/05/99   hip pain  . LUMBAR SPINE SURGERY  5/00  . MRI  10/07/98   degen changes 9Dr. French Ana) steroid injections  . MYELOGRAM  9/01   stenosis L4-5    Family History  Problem Relation Age of Onset  . Prostate cancer Father   . Stroke      grandmother  . Lung cancer Sister   . Heart disease Brother     Allergies  Allergen Reactions  . Clarithromycin     REACTION: unspecified  . Codeine Phosphate     REACTION: nausea  . Nabumetone     REACTION: Muscle cramps    Current Outpatient Prescriptions on File Prior to Visit  Medication Sig  Dispense Refill  . aspirin 81 MG tablet Take 81 mg by mouth daily.    . fluticasone (FLONASE) 50 MCG/ACT nasal spray Place 1 spray into the nose at bedtime.    . metoprolol succinate (TOPROL-XL) 50 MG 24 hr tablet Take 50 mg by mouth daily. Take with or immediately following a meal.    . Multiple Vitamins-Minerals (MULTIVITAMIN ADULT PO) Take by mouth daily.    Marland Kitchen omeprazole (PRILOSEC) 40 MG capsule TAKE ONE CAPSULE BY MOUTH EVERY DAY 30 capsule 1  . Saw Palmetto 450 MG CAPS Take 1 capsule by mouth daily.     No current facility-administered medications on file prior to visit.     BP 140/80 (BP Location: Right Arm, Patient Position: Sitting, Cuff Size: Normal)   Pulse 64   Temp 97.5 F (36.4 C) (Oral)   Ht _0  (1.753 m) Comment: no shoes  Wt 210 lb 8 oz (95.5 kg)   SpO2 97%   BMI 31.09 kg/m    Objective:   Physical Exam  Constitutional: He is oriented to person, place, and time. He appears well-nourished.  HENT:  Right Ear: Tympanic membrane and ear canal normal.  Left Ear: Tympanic membrane and ear canal normal.  Nose: Nose normal. Right sinus exhibits no maxillary sinus tenderness and no frontal sinus tenderness. Left sinus exhibits no maxillary sinus tenderness and no frontal sinus tenderness.  Mouth/Throat: Oropharynx is clear and moist.  Eyes: Conjunctivae and EOM are normal. Pupils are equal, round, and reactive to light.  Neck: Neck supple. Carotid bruit is not present. No thyromegaly present.  Cardiovascular: Normal rate, regular rhythm and normal heart sounds.   Pulmonary/Chest: Effort normal and breath sounds normal. He has no wheezes. He has no rales.  Abdominal: Soft. Bowel sounds are normal. There is no tenderness.  Musculoskeletal: Normal range of motion.  Neurological: He is alert and oriented to person, place, and time. He has normal reflexes. No cranial nerve deficit.  Skin: Skin is warm and dry.  Psychiatric: He has a normal mood and affect.            Assessment & Plan:  Prevnar 13 vaccination provided today. He will check on insurance coverage regarding shingles vaccination. Declines colonoscopy, acceptable given age. Following with urology for PSA levels. Discussed the importance of a healthy diet and regular exercise in order for weight loss, and to reduce the risk of other medical diseases. Follow-up in one year for repeat exam or sooner if needed.

## 2016-09-22 NOTE — Assessment & Plan Note (Signed)
Lipids stable, LFTs normal. Refill provided for simvastatin.

## 2016-09-22 NOTE — Assessment & Plan Note (Signed)
Recent PSA of 5.07. He will follow up with his urologist later next month as scheduled.

## 2016-09-22 NOTE — Progress Notes (Signed)
PCP notes:   Health maintenance:  PNA vaccine - pt is unable to recall date of last vaccine Colonoscopy - pt declined Flu vaccine - pt reported vaccine in Jan 2018   Abnormal screenings:   Hearing - failed  Patient concerns:   Pt has painful cyst on on index finger on right hand.   Nurse concerns:  None  Next PCP appt:   09/22/16 @ 1145

## 2016-09-22 NOTE — Patient Instructions (Signed)
Chad Huang , Thank you for taking time to come for your Medicare Wellness Visit. I appreciate your ongoing commitment to your health goals. Please review the following plan we discussed and let me know if I can assist you in the future.   These are the goals we discussed: Goals    . Increase physical activity          Starting 09/22/2016, I will continue to exercise at least 30 min 2-3 days per week.        This is a list of the screening recommended for you and due dates:  Health Maintenance  Topic Date Due  . Pneumonia vaccines (1 of 2 - PCV13) 09/22/2017*  . Colon Cancer Screening  09/23/2026*  . Flu Shot  12/15/2016  . Tetanus Vaccine  09/19/2018  *Topic was postponed. The date shown is not the original due date.   Preventive Care for Adults  A healthy lifestyle and preventive care can promote health and wellness. Preventive health guidelines for adults include the following key practices.  . A routine yearly physical is a good way to check with your health care provider about your health and preventive screening. It is a chance to share any concerns and updates on your health and to receive a thorough exam.  . Visit your dentist for a routine exam and preventive care every 6 months. Brush your teeth twice a day and floss once a day. Good oral hygiene prevents tooth decay and gum disease.  . The frequency of eye exams is based on your age, health, family medical history, use  of contact lenses, and other factors. Follow your health care provider's ecommendations for frequency of eye exams.  . Eat a healthy diet. Foods like vegetables, fruits, whole grains, low-fat dairy products, and lean protein foods contain the nutrients you need without too many calories. Decrease your intake of foods high in solid fats, added sugars, and salt. Eat the right amount of calories for you. Get information about a proper diet from your health care provider, if necessary.  . Regular physical exercise  is one of the most important things you can do for your health. Most adults should get at least 150 minutes of moderate-intensity exercise (any activity that increases your heart rate and causes you to sweat) each week. In addition, most adults need muscle-strengthening exercises on 2 or more days a week.  Silver Sneakers may be a benefit available to you. To determine eligibility, you may visit the website: www.silversneakers.com or contact program at 8165456861 Mon-Fri between 8AM-8PM.   . Maintain a healthy weight. The body mass index (BMI) is a screening tool to identify possible weight problems. It provides an estimate of body fat based on height and weight. Your health care provider can find your BMI and can help you achieve or maintain a healthy weight.   For adults 20 years and older: ? A BMI below 18.5 is considered underweight. ? A BMI of 18.5 to 24.9 is normal. ? A BMI of 25 to 29.9 is considered overweight. ? A BMI of 30 and above is considered obese.   . Maintain normal blood lipids and cholesterol levels by exercising and minimizing your intake of saturated fat. Eat a balanced diet with plenty of fruit and vegetables. Blood tests for lipids and cholesterol should begin at age 67 and be repeated every 5 years. If your lipid or cholesterol levels are high, you are over 50, or you are at high risk for  heart disease, you may need your cholesterol levels checked more frequently. Ongoing high lipid and cholesterol levels should be treated with medicines if diet and exercise are not working.  . If you smoke, find out from your health care provider how to quit. If you do not use tobacco, please do not start.  . If you choose to drink alcohol, please do not consume more than 2 drinks per day. One drink is considered to be 12 ounces (355 mL) of beer, 5 ounces (148 mL) of wine, or 1.5 ounces (44 mL) of liquor.  . If you are 55-33 years old, ask your health care provider if you should take  aspirin to prevent strokes.  . Use sunscreen. Apply sunscreen liberally and repeatedly throughout the day. You should seek shade when your shadow is shorter than you. Protect yourself by wearing long sleeves, pants, a wide-brimmed hat, and sunglasses year round, whenever you are outdoors.  . Once a month, do a whole body skin exam, using a mirror to look at the skin on your back. Tell your health care provider of new moles, moles that have irregular borders, moles that are larger than a pencil eraser, or moles that have changed in shape or color.

## 2016-09-22 NOTE — Progress Notes (Signed)
I reviewed health advisor's note, was available for consultation, and agree with documentation and plan.  

## 2016-10-04 ENCOUNTER — Other Ambulatory Visit: Payer: Self-pay

## 2016-10-04 MED ORDER — OMEPRAZOLE 40 MG PO CPDR: 40.0000 mg | DELAYED_RELEASE_CAPSULE | Freq: Every day | ORAL | 0 refills | Status: DC

## 2016-10-27 ENCOUNTER — Other Ambulatory Visit: Payer: Self-pay

## 2016-10-27 MED ORDER — METOPROLOL SUCCINATE ER 50 MG PO TB24: 50.0000 mg | ORAL_TABLET | Freq: Every day | ORAL | 3 refills | Status: DC

## 2016-10-27 MED ORDER — FLUTICASONE PROPIONATE 50 MCG/ACT NA SUSP: 1.0000 | Freq: Two times a day (BID) | NASAL | 0 refills | Status: DC | PRN

## 2016-10-27 NOTE — Telephone Encounter (Signed)
Pt daughter called [facility] signed) requesting refill metoprolol and flonase. Did not see flonase mentioned in 09/22/16 office note. [facility]. Metoprolol refilled per protocol.

## 2017-01-01 ENCOUNTER — Other Ambulatory Visit: Payer: Self-pay | Admitting: Gastroenterology

## 2017-01-03 ENCOUNTER — Other Ambulatory Visit: Payer: Self-pay

## 2017-01-03 MED ORDER — OMEPRAZOLE 40 MG PO CPDR: 40.0000 mg | DELAYED_RELEASE_CAPSULE | Freq: Every day | ORAL | 0 refills | Status: DC

## 2017-01-03 NOTE — Telephone Encounter (Signed)
Chad Huang (DPR signed) left v/m requesting refill omeprazole 40 mg. Last refilled by Dr Fuller Plan and 01/01/17 note Dr Silvio Pate office refused refill due to pt needing to be seen. When pt established care 09/20/16 note in chart was pt follow with GI annually for reevaluation.Please advise. Chad Huang request cb.

## 2017-01-05 ENCOUNTER — Ambulatory Visit (INDEPENDENT_AMBULATORY_CARE_PROVIDER_SITE_OTHER): Payer: Medicare Other | Admitting: Primary Care

## 2017-01-05 VITALS — BP 136/84 | HR 64 | Temp 97.6°F | Ht 70.0 in | Wt 213.0 lb

## 2017-01-05 DIAGNOSIS — J302 Other seasonal allergic rhinitis: Secondary | ICD-10-CM | POA: Diagnosis not present

## 2017-01-05 DIAGNOSIS — J069 Acute upper respiratory infection, unspecified: Secondary | ICD-10-CM | POA: Diagnosis not present

## 2017-01-05 NOTE — Progress Notes (Signed)
Subjective:    Patient ID: Chad Huang, male    DOB: [birth year], 76 y.o.   MRN: [id number]  HPI  Mr. Girardin is a 76 year old male who presents today with a chief complaint of nasal congestion. He also reports left ear pain, cough, fatigue, chest congestion, dizziness. His symptoms began 5 days ago. The day prior to his symptoms he had visited a nursing home and also spent most of his day mowing a 5 acre lawn. He's been taking Tylenol, Flonase and using "sweet oil" and half vinegar/alcohol to his ears without much improvement. He denies fevers, sore throat, productive cough.  Review of Systems  Constitutional: Positive for fatigue. Negative for fever.  HENT: Positive for congestion and ear pain. Negative for sinus pressure and sore throat.   Respiratory: Positive for cough. Negative for shortness of breath.   Cardiovascular: Negative for chest pain.       Past Medical History:  Diagnosis Date  . Alcohol abuse   . Anxiety and depression    charter, admission  . Back pain    ([place]), microdiskectomy-great result  . Benign prostatic hypertrophy    per Dr Jeffie Pollock with uro  . Cataracts, bilateral   . Cerumen impaction    left  . Chest pain   . GERD (gastroesophageal reflux disease)   . Hearing loss   . Hiatal hernia    nodule  . HTN (hypertension)   . Hyperlipidemia, mixed   . Inguinal hernia    left, hx of, repair  . Internal hemorrhoids   . Left ear pain   . Muscle disorder    disorder, muscle/ligaminet nis, rt lower extremity  . Prostate cancer [facility]) 2010   Dr Jeffie Pollock  . Skin cancer    hx, left ear     Social History   Social History  . Marital status: Married    Spouse name: N/A  . Number of children: N/A  . Years of education: N/A   Occupational History  . Not on file.   Social History Main Topics  . Smoking status: Former Smoker    Packs/day: 1.00    Years: 20.00    Types: Cigarettes  . Smokeless tobacco: Current User    Types: Chew     Comment: quit  in 1973  . Alcohol use No     Comment: none since 1996  . Drug use: No  . Sexual activity: Not on file   Other Topics Concern  . Not on file   Social History Narrative   Married,    One daughter.    1 granddaughter.   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      [phone] (c?)    Past Surgical History:  Procedure Laterality Date  . back microdiskectomy  10/01   [place]  . BACK SURGERY    . CARDIOVASCULAR STRESS TEST  2009   [place]  . COLONOSCOPY  5/05   polyps int hemorrhoids, repeat 10 yrs  . ESOPHAGOGASTRODUODENOSCOPY  03/02/00   gastritis, chronic  . ESOPHAGOGASTRODUODENOSCOPY  5/05   polyps in antrum, neg H Pylori  . ETT  05/19/07   Adenosine Muoview  . H Pylori positive  8/98  . HERNIA REPAIR    . LAPAROSCOPIC INGUINAL HERNIA REPAIR  10/96  . levitin eval  02/05/99   hip pain  . LUMBAR SPINE SURGERY  5/00  . MRI  10/07/98   degen changes 9Dr. French Ana) steroid injections  . MYELOGRAM  9/01   stenosis L4-5    Family History  Problem Relation Age of Onset  . Prostate cancer Father   . Stroke Unknown        grandmother  . Lung cancer Sister   . Heart disease Brother     Allergies  Allergen Reactions  . Clarithromycin     REACTION: unspecified  . Codeine Phosphate     REACTION: nausea  . Nabumetone     REACTION: Muscle cramps    Current Outpatient Prescriptions on File Prior to Visit  Medication Sig Dispense Refill  . aspirin 81 MG tablet Take 81 mg by mouth daily.    . fluticasone (FLONASE) 50 MCG/ACT nasal spray Place 1 spray into both nostrils 2 (two) times daily as needed for allergies or rhinitis. 16 g 0  . metoprolol succinate (TOPROL-XL) 50 MG 24 hr tablet Take 1 tablet (50 mg total) by mouth daily. Take with or immediately following a meal. 90 tablet 3  . Multiple Vitamins-Minerals (MULTIVITAMIN ADULT PO) Take by mouth daily.    Marland Kitchen omeprazole (PRILOSEC) 40 MG capsule Take 1 capsule (40 mg total) by mouth daily. 90 capsule 0  . Saw  Palmetto 450 MG CAPS Take 1 capsule by mouth daily.    . simvastatin (ZOCOR) 40 MG tablet Take 1 tablet (40 mg total) by mouth every evening. 90 tablet 3   No current facility-administered medications on file prior to visit.     BP 136/84   Pulse 64   Temp 97.6 F (36.4 C) (Oral)   Ht 5\' 10"  (1.778 m)   Wt 213 lb (96.6 kg)   SpO2 97%   BMI 30.56 kg/m    Objective:   Physical Exam  Constitutional: He appears well-nourished. He does not appear ill.  HENT:  Right Ear: Tympanic membrane and ear canal normal.  Left Ear: Tympanic membrane and ear canal normal.  Nose: Mucosal edema present. Right sinus exhibits no maxillary sinus tenderness and no frontal sinus tenderness. Left sinus exhibits no maxillary sinus tenderness and no frontal sinus tenderness.  Mouth/Throat: Oropharynx is clear and moist.  Eyes: Conjunctivae are normal.  Neck: Neck supple.  Cardiovascular: Normal rate and regular rhythm.   Pulmonary/Chest: Effort normal and breath sounds normal. He has no wheezes. He has no rales.  Skin: Skin is warm and dry.          Assessment & Plan:  Allergic Rhinitis vs URI:  Nasal congestion, cough, fatigue x 5 days. Suspect either viral involvement during nursing home visit and/or allergy involvement after mowing his 5 acre lawn. He appears well, exam overall unremarkable. Continue Flonase, add antihistamine daily, and Delsym PRN. He will update if no improvement in 4 days.   Sheral Flow, NP

## 2017-01-05 NOTE — Patient Instructions (Signed)
Your symptoms are representative of a viral illness which will resolve on its own over time. Our goal is to treat your symptoms in order to aid your body in the healing process and to make you more comfortable.   Nasal Congestion/Ear Pressure: Continue using Flonase (fluticasone) nasal spray. Instill 1 spray in each nostril twice daily.   Start an antihistamine such as Zyrtec, Allegra, or Claritin. Take this daily for the next 4-6 weeks, especially during mowing season.  You can try Delsym or Robitussin for cough. These may be purchased over the counter.  It was a pleasure to see you today!

## 2017-03-30 ENCOUNTER — Other Ambulatory Visit: Payer: Self-pay | Admitting: Internal Medicine

## 2017-03-30 NOTE — Telephone Encounter (Signed)
Last filled by [facility]... Please advise

## 2017-03-31 NOTE — Telephone Encounter (Signed)
Refill sent to pharmacy.   

## 2017-06-07 ENCOUNTER — Ambulatory Visit: Payer: Medicare Other | Admitting: Primary Care

## 2017-06-07 ENCOUNTER — Encounter: Payer: Self-pay | Admitting: Primary Care

## 2017-06-07 VITALS — BP 132/80 | HR 60 | Temp 98.0°F | Ht 70.0 in | Wt 217.1 lb

## 2017-06-07 DIAGNOSIS — J3489 Other specified disorders of nose and nasal sinuses: Secondary | ICD-10-CM

## 2017-06-07 MED ORDER — PREDNISONE 10 MG PO TABS
ORAL_TABLET | ORAL | 0 refills | Status: DC
Start: 2017-06-07 — End: 2017-09-26

## 2017-06-07 NOTE — Progress Notes (Signed)
Subjective:    Patient ID: Chad Huang, male    DOB: Mar 14, 1941, 77 y.o.   MRN: [id number]  HPI  [name] is a 77 year old male who presents today with a chief complaint of ear fullness, nasal congestion, sinus pressure. His ear pressure is located to both ears which has been present for the past one month. His sinus pressure began several weeks ago. He's blowing clear mucous from his nasal cavity. He's been using Flonase and Tylenol.   Review of Systems  Constitutional: Negative for fatigue and fever.  HENT: Positive for sinus pressure. Negative for congestion and sore throat.        Ear fullness  Respiratory: Negative for cough.        Past Medical History:  Diagnosis Date  . Alcohol abuse   . Anxiety and depression    charter, admission  . Back pain    ([place]ritzer), microdiskectomy-great result  . Benign prostatic hypertrophy    per Dr Jeffie Pollock with uro  . Cataracts, bilateral   . Cerumen impaction    left  . Chest pain   . GERD (gastroesophageal reflux disease)   . Hearing loss   . Hiatal hernia    nodule  . HTN (hypertension)   . Hyperlipidemia, mixed   . Inguinal hernia    left, hx of, repair  . Internal hemorrhoids   . Left ear pain   . Muscle disorder    disorder, muscle/ligaminet nis, rt lower extremity  . Prostate cancer [facility]) 2010   Dr Jeffie Pollock  . Skin cancer    hx, left ear     Social History   Socioeconomic History  . Marital status: Widowed    Spouse name: Not on file  . Number of children: Not on file  . Years of education: Not on file  . Highest education level: Not on file  Social Needs  . Financial resource strain: Not on file  . Food insecurity - worry: Not on file  . Food insecurity - inability: Not on file  . Transportation needs - medical: Not on file  . Transportation needs - non-medical: Not on file  Occupational History  . Not on file  Tobacco Use  . Smoking status: Former Smoker    Packs/day: 1.00    Years: 20.00    Pack years:  20.00    Types: Cigarettes  . Smokeless tobacco: Current User    Types: Chew  . Tobacco comment: quit in 1973  Substance and Sexual Activity  . Alcohol use: No    Comment: none since 1996  . Drug use: No  . Sexual activity: Not on file  Other Topics Concern  . Not on file  Social History Narrative   Married,    One daughter.    1 granddaughter.   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      [phone] (c?)    Past Surgical History:  Procedure Laterality Date  . back microdiskectomy  10/01   [place]  . BACK SURGERY    . CARDIOVASCULAR STRESS TEST  2009     . COLONOSCOPY  5/05   polyps int hemorrhoids, repeat 10 yrs  . ESOPHAGOGASTRODUODENOSCOPY  03/02/00   gastritis, chronic  . ESOPHAGOGASTRODUODENOSCOPY  5/05   polyps in antrum, neg H Pylori  . ETT  05/19/07   Adenosine Muoview  . H Pylori positive  8/98  . HERNIA REPAIR    . LAPAROSCOPIC INGUINAL HERNIA REPAIR  10/96  . levitin eval  02/05/99   hip pain  . LUMBAR SPINE SURGERY  5/00  . MRI  10/07/98   degen changes 9Dr. French Ana) steroid injections  . MYELOGRAM  9/01   stenosis L4-5    Family History  Problem Relation Age of Onset  . Prostate cancer Father   . Stroke Unknown        grandmother  . Lung cancer Sister   . Heart disease Brother     Allergies  Allergen Reactions  . Clarithromycin     REACTION: unspecified  . Codeine Phosphate     REACTION: nausea  . Nabumetone     REACTION: Muscle cramps    Current Outpatient Medications on File Prior to Visit  Medication Sig Dispense Refill  . aspirin 81 MG tablet Take 81 mg by mouth daily.    . fluticasone (FLONASE) 50 MCG/ACT nasal spray Place 1 spray into both nostrils 2 (two) times daily as needed for allergies or rhinitis. 16 g 0  . metoprolol succinate (TOPROL-XL) 50 MG 24 hr tablet Take 1 tablet (50 mg total) by mouth daily. Take with or immediately following a meal. 90 tablet 3  . Multiple Vitamins-Minerals (MULTIVITAMIN ADULT PO)  Take by mouth daily.    Marland Kitchen omeprazole (PRILOSEC) 40 MG capsule TAKE 1 CAPSULE BY MOUTH EVERY DAY 90 capsule 1  . Saw Palmetto 450 MG CAPS Take 1 capsule by mouth daily.    . simvastatin (ZOCOR) 40 MG tablet Take 1 tablet (40 mg total) by mouth every evening. 90 tablet 3   No current facility-administered medications on file prior to visit.     BP 132/80   Pulse 60   Temp 98 F (36.7 C) (Oral)   Ht 5\' 10"  (1.778 m)   Wt 217 lb 1.9 oz (98.5 kg)   SpO2 98%   BMI 31.15 kg/m    Objective:   Physical Exam  Constitutional: He appears well-nourished.  HENT:  Right Ear: Tympanic membrane and ear canal normal.  Left Ear: Tympanic membrane and ear canal normal.  Nose: No mucosal edema. Right sinus exhibits maxillary sinus tenderness. Right sinus exhibits no frontal sinus tenderness. Left sinus exhibits maxillary sinus tenderness. Left sinus exhibits no frontal sinus tenderness.  Mouth/Throat: Oropharynx is clear and moist.  Eyes: Conjunctivae are normal.  Neck: Neck supple.  Cardiovascular: Normal rate and regular rhythm.  Pulmonary/Chest: Effort normal and breath sounds normal. He has no wheezes. He has no rales.  Skin: Skin is warm and dry.          Assessment & Plan:  Viral Sinusitis vs Allergic Rhinitis:  Nasal congestion, sinus pressure x several weeks. Exam today without evidence of bacterial process. Suspect viral or allergy involvement. Rx for prednisone course sent to pharmacy. Hold flonase for now. He will update if no improvement in 3-4 days.  Sheral Flow, NP

## 2017-06-07 NOTE — Patient Instructions (Signed)
Start prednisone tablets. Take three tablets for 3 days, then two tablets for 3 days, then one tablet for 3 days.  Stop Flonase for now until Prednisone is complete.  Continue Tylenol as needed.  Please call me if no improvement in 3-4 days.  It was a pleasure to see you today!

## 2017-07-05 ENCOUNTER — Ambulatory Visit: Payer: Medicare Other | Admitting: Gastroenterology

## 2017-09-07 ENCOUNTER — Other Ambulatory Visit: Payer: Self-pay | Admitting: Primary Care

## 2017-09-07 DIAGNOSIS — E785 Hyperlipidemia, unspecified: Secondary | ICD-10-CM

## 2017-09-08 ENCOUNTER — Other Ambulatory Visit: Payer: Self-pay | Admitting: Primary Care

## 2017-09-20 ENCOUNTER — Other Ambulatory Visit: Payer: Self-pay | Admitting: Primary Care

## 2017-09-23 ENCOUNTER — Other Ambulatory Visit: Payer: Self-pay | Admitting: Primary Care

## 2017-09-23 DIAGNOSIS — C61 Malignant neoplasm of prostate: Secondary | ICD-10-CM

## 2017-09-23 DIAGNOSIS — E782 Mixed hyperlipidemia: Secondary | ICD-10-CM

## 2017-09-23 DIAGNOSIS — I10 Essential (primary) hypertension: Secondary | ICD-10-CM

## 2017-09-26 ENCOUNTER — Ambulatory Visit (INDEPENDENT_AMBULATORY_CARE_PROVIDER_SITE_OTHER): Payer: Medicare Other | Admitting: Primary Care

## 2017-09-26 ENCOUNTER — Encounter: Payer: Self-pay | Admitting: Primary Care

## 2017-09-26 ENCOUNTER — Ambulatory Visit (INDEPENDENT_AMBULATORY_CARE_PROVIDER_SITE_OTHER): Payer: Medicare Other

## 2017-09-26 VITALS — BP 132/84 | HR 53 | Temp 97.9°F | Ht 69.0 in | Wt 204.8 lb

## 2017-09-26 DIAGNOSIS — C61 Malignant neoplasm of prostate: Secondary | ICD-10-CM

## 2017-09-26 DIAGNOSIS — K219 Gastro-esophageal reflux disease without esophagitis: Secondary | ICD-10-CM

## 2017-09-26 DIAGNOSIS — Z0000 Encounter for general adult medical examination without abnormal findings: Secondary | ICD-10-CM | POA: Insufficient documentation

## 2017-09-26 DIAGNOSIS — Z23 Encounter for immunization: Secondary | ICD-10-CM

## 2017-09-26 DIAGNOSIS — R42 Dizziness and giddiness: Secondary | ICD-10-CM | POA: Diagnosis not present

## 2017-09-26 DIAGNOSIS — I10 Essential (primary) hypertension: Secondary | ICD-10-CM | POA: Diagnosis not present

## 2017-09-26 DIAGNOSIS — E782 Mixed hyperlipidemia: Secondary | ICD-10-CM

## 2017-09-26 DIAGNOSIS — Z0001 Encounter for general adult medical examination with abnormal findings: Secondary | ICD-10-CM | POA: Diagnosis not present

## 2017-09-26 LAB — LIPID PANEL
Cholesterol: 103 mg/dL (ref 0–200)
HDL: 34.2 mg/dL — AB (ref >39.00)
LDL Cholesterol: 53 mg/dL (ref 0–99)
NonHDL: 68.62
TRIGLYCERIDES: 77 mg/dL (ref 0.0–149.0)
Total CHOL/HDL Ratio: 3
VLDL: 15.4 mg/dL (ref 0.0–40.0)

## 2017-09-26 LAB — COMPREHENSIVE METABOLIC PANEL
ALT: 9 U/L (ref 0–53)
AST: 13 U/L (ref 0–37)
Albumin: 3.9 g/dL (ref 3.5–5.2)
Alkaline Phosphatase: 61 U/L (ref 39–117)
BILIRUBIN TOTAL: 0.6 mg/dL (ref 0.2–1.2)
BUN: 14 mg/dL (ref 6–23)
CALCIUM: 8.8 mg/dL (ref 8.4–10.5)
CO2: 29 meq/L (ref 19–32)
Chloride: 105 mEq/L (ref 96–112)
Creatinine, Ser: 1.17 mg/dL (ref 0.40–1.50)
GFR: 64.32 mL/min (ref >60.00)
GLUCOSE: 92 mg/dL (ref 70–99)
POTASSIUM: 4.5 meq/L (ref 3.5–5.1)
Sodium: 139 mEq/L (ref 135–145)
TOTAL PROTEIN: 5.8 g/dL — AB (ref 6.0–8.3)

## 2017-09-26 LAB — PSA, MEDICARE: PSA: 4.72 ng/ml — ABNORMAL HIGH (ref 0.10–4.00)

## 2017-09-26 MED ORDER — ZOSTER VAC RECOMB ADJUVANTED 50 MCG/0.5ML IM SUSR: 0.5000 mL | Freq: Once | INTRAMUSCULAR | 1 refills | Status: AC

## 2017-09-26 NOTE — Progress Notes (Signed)
   Subjective:    Patient ID: Chad Huang, male    DOB: 1941/02/27, 77 y.o.   MRN: [id number]  HPI  Chad Huang is a 77 year old male who presents today for complete physical. He saw our health advisor for [place] this morning.   Immunizations: -Tetanus: Completed in 2010 -Pneumonia: Prevnar in 2018, Pneumovax in 2010 -Shingles: Never completed  Diet: He endorses a fair diet. Breakfast: Toast, eggs, jelly,  Lunch: Sandwich  Dinner: Vegetables, potatoes Snacks: None Desserts: Occasionally, graham crackers with peanut butter, cake, candy Beverages: Sweet tea, water, milk, occasional coffee  Exercise: He is not currently exercising, was doing weights Eye exam: Completed in 2019 Dental exam: No recent exam, dentures Colonoscopy: Completed in 2005 normal, declines  PSA: Pending, elevated in 2018  BP Readings from Last 3 Encounters:  09/26/17 132/84  09/26/17 132/84  06/07/17 132/80   Intermittent dizziness on three occasions within the last 5 days, feels "swimmy headed". This will last a few minutes and resolve after sitting down. He denies chest pain, shortness of breath, headaches, visual changes. He thinks this may be secondary to allergies.   Review of Systems  Constitutional: Negative for fatigue and unexpected weight change.  HENT: Positive for rhinorrhea.   Eyes: Negative for visual disturbance.  Respiratory: Negative for cough and shortness of breath.   Cardiovascular: Negative for chest pain.  Gastrointestinal: Negative for constipation and diarrhea.  Genitourinary: Negative for difficulty urinating.  Musculoskeletal: Negative for arthralgias and myalgias.  Skin: Negative for rash.  Allergic/Immunologic: Positive for environmental allergies.  Neurological: Positive for light-headedness. Negative for dizziness, numbness and headaches.  Psychiatric/Behavioral: The patient is not nervous/anxious.        Objective:   Physical Exam  Constitutional: He is oriented to  person, place, and time. He appears well-nourished.  HENT:  Right Ear: Tympanic membrane and ear canal normal.  Left Ear: Tympanic membrane and ear canal normal.  Nose: Nose normal. Right sinus exhibits no maxillary sinus tenderness and no frontal sinus tenderness. Left sinus exhibits no maxillary sinus tenderness and no frontal sinus tenderness.  Mouth/Throat: Oropharynx is clear and moist.  TM's cloudy bilaterally, no obvious effusion  Eyes: Pupils are equal, round, and reactive to light. Conjunctivae and EOM are normal.  Neck: Neck supple. Carotid bruit is not present. No thyromegaly present.  Cardiovascular: Normal rate, regular rhythm and normal heart sounds.  Pulmonary/Chest: Effort normal and breath sounds normal. He has no wheezes. He has no rales.  Abdominal: Soft. Bowel sounds are normal. There is no tenderness.  Musculoskeletal: Normal range of motion.  Neurological: He is alert and oriented to person, place, and time. He has normal reflexes. No cranial nerve deficit.  Skin: Skin is warm and dry.  Psychiatric: He has a normal mood and affect.          Assessment & Plan:  Dizziness:  Three episodes within 5 days, improved within a minute.  Exam today unremarkable, TM's are cloudy bilaterally. Low suspicion for ACS and CVA given lack of cardinal signs and presentation. Differentials include inner ear/eustacian tube involvement, vertigo.  Will have him trial Flonase BID and update. Consider antihistamine vs Meclizine.  [name], NP

## 2017-09-26 NOTE — Assessment & Plan Note (Signed)
Stable in the office today, continue Toprol XL.

## 2017-09-26 NOTE — Progress Notes (Signed)
PCP notes:   Health maintenance:  No gaps identified.   Abnormal screenings:   Hearing - failed  Hearing Screening   125Hz  250Hz  500Hz  1000Hz  2000Hz  3000Hz  4000Hz  6000Hz  8000Hz   Right ear:   0 0 0  0    Left ear:   0 0 0  0     Patient concerns:   Dizziness - 3 episodes since 09/21/17. PCP notified.   Nurse concerns:  None  Next PCP appt:   09/26/2017 @ 1145

## 2017-09-26 NOTE — Assessment & Plan Note (Signed)
Following with Urology, recent PSA of 3.9 in April 2019.

## 2017-09-26 NOTE — Assessment & Plan Note (Signed)
Improved since eliminating soda. Continue Omeprazole.

## 2017-09-26 NOTE — Assessment & Plan Note (Signed)
Significant improved his diet since last visit. Repeat lipids pending. Recommended to start exercising again.

## 2017-09-26 NOTE — Patient Instructions (Signed)
Nasal Congestion/Ear Pressure/Dizziness: Try using Flonase (fluticasone) nasal spray. Instill 1 spray in each nostril twice daily. You can pick this up at the drug store.  Start exercising. You should be getting 150 minutes of exercise weekly.  Continue working on [employer].  Ensure you are consuming 64 ounces of water daily.  Follow up in 1 year for your annual exam or sooner if needed.  It was a pleasure to see you today!   Preventive Care 77 Years and Older, Male Preventive care refers to lifestyle choices and visits with your health care provider that can promote health and wellness. What does preventive care include?  A yearly physical exam. This is also called an annual well check.  Dental exams once or twice a year.  Routine eye exams. Ask your health care provider how often you should have your eyes checked.  Personal lifestyle choices, including: ? Daily care of your teeth and gums. ? Regular physical activity. ? Eating a healthy diet. ? Avoiding tobacco and drug use. ? Limiting alcohol use. ? Practicing safe sex. ? Taking low doses of aspirin every day. ? Taking vitamin and mineral supplements as recommended by your health care provider. What happens during an annual well check? The services and screenings done by your health care provider during your annual well check will depend on your age, overall health, lifestyle risk factors, and family history of disease. Counseling Your health care provider may ask you questions about your:  Alcohol use.  Tobacco use.  Drug use.  Emotional well-being.  Home and relationship well-being.  Sexual activity.  Eating habits.  History of falls.  Memory and ability to understand (cognition).  Work and work Statistician.  Screening You may have the following tests or measurements:  Height, weight, and BMI.  Blood pressure.  Lipid and cholesterol levels. These may be checked every 5 years, or more frequently if  you are over 50 years old.  Skin check.  Lung cancer screening. You may have this screening every year starting at age 45 if you have a 30-pack-year history of smoking and currently smoke or have quit within the past 15 years.  Fecal occult blood test (FOBT) of the stool. You may have this test every year starting at age 89.  Flexible sigmoidoscopy or colonoscopy. You may have a sigmoidoscopy every 5 years or a colonoscopy every 10 years starting at age 61.  Prostate cancer screening. Recommendations will vary depending on your family history and other risks.  Hepatitis C blood test.  Hepatitis B blood test.  Sexually transmitted disease (STD) testing.  Diabetes screening. This is done by checking your blood sugar (glucose) after you have not eaten for a while (fasting). You may have this done every 1-3 years.  Abdominal aortic aneurysm (AAA) screening. You may need this if you are a current or former smoker.  Osteoporosis. You may be screened starting at age 56 if you are at high risk.  Talk with your health care provider about your test results, treatment options, and if necessary, the need for more tests. Vaccines Your health care provider may recommend certain vaccines, such as:  Influenza vaccine. This is recommended every year.  Tetanus, diphtheria, and acellular pertussis (Tdap, Td) vaccine. You may need a Td booster every 10 years.  Varicella vaccine. You may need this if you have not been vaccinated.  Zoster vaccine. You may need this after age 41.  Measles, mumps, and rubella (MMR) vaccine. You may need at least  one dose of MMR if you were born in 1957 or later. You may also need a second dose.  Pneumococcal 13-valent conjugate (PCV13) vaccine. One dose is recommended after age 20.  Pneumococcal polysaccharide (PPSV23) vaccine. One dose is recommended after age 53.  Meningococcal vaccine. You may need this if you have certain conditions.  Hepatitis A vaccine. You  may need this if you have certain conditions or if you travel or work in places where you may be exposed to hepatitis A.  Hepatitis B vaccine. You may need this if you have certain conditions or if you travel or work in places where you may be exposed to hepatitis B.  Haemophilus influenzae type b (Hib) vaccine. You may need this if you have certain risk factors.  Talk to your health care provider about which screenings and vaccines you need and how often you need them. This information is not intended to replace advice given to you by your health care provider. Make sure you discuss any questions you have with your health care provider. Document Released: 05/30/2015 Document Revised: 01/21/2016 Document Reviewed: 03/04/2015 Elsevier Interactive Patient Education  [employer].

## 2017-09-26 NOTE — Assessment & Plan Note (Signed)
Rx for Shingrix printed, all other vaccinations UTD. PSA UTD, also pending. Declines colonoscopy due to age. Commended him on changes in diet, recommended to start with regular exercise. Exam unremarkable. Labs pending. Follow up in 1 year for CPE.

## 2017-09-26 NOTE — Patient Instructions (Addendum)
[name] , Thank you for taking time to come for your Medicare Wellness Visit. I appreciate your ongoing commitment to your health goals. Please review the following plan we discussed and let me know if I can assist you in the future.   These are the goals we discussed: Goals    . Increase physical activity     When schedule permits, I will resume exercising for 30-45 minutes daily.        This is a list of the screening recommended for you and due dates:  Health Maintenance  Topic Date Due  . Flu Shot  12/15/2017  . Tetanus Vaccine  09/19/2018  . Pneumonia vaccines  Completed   Preventive Care for Adults  A healthy lifestyle and preventive care can promote health and wellness. Preventive health guidelines for adults include the following key practices.  . A routine yearly physical is a good way to check with your health care provider about your health and preventive screening. It is a chance to share any concerns and updates on your health and to receive a thorough exam.  . Visit your dentist for a routine exam and preventive care every 6 months. Brush your teeth twice a day and floss once a day. Good oral hygiene prevents tooth decay and gum disease.  . The frequency of eye exams is based on your age, health, family medical history, use  of contact lenses, and other factors. Follow your health care provider's recommendations for frequency of eye exams.  . Eat a healthy diet. Foods like vegetables, fruits, whole grains, low-fat dairy products, and lean protein foods contain the nutrients you need without too many calories. Decrease your intake of foods high in solid fats, added sugars, and salt. Eat the right amount of calories for you. Get information about a proper diet from your health care provider, if necessary.  . Regular physical exercise is one of the most important things you can do for your health. Most adults should get at least 150 minutes of moderate-intensity exercise  (any activity that increases your heart rate and causes you to sweat) each week. In addition, most adults need muscle-strengthening exercises on 2 or more days a week.  Silver Sneakers may be a benefit available to you. To determine eligibility, you may visit the website: www.silversneakers.com or contact program at (810) [phone] Mon-Fri between 8AM-8PM.   . Maintain a healthy weight. The body mass index (BMI) is a screening tool to identify possible weight problems. It provides an estimate of body fat based on height and weight. Your health care provider can find your BMI and can help you achieve or maintain a healthy weight.   For adults 20 years and older: ? A BMI below 18.5 is considered underweight. ? A BMI of 18.5 to 24.9 is normal. ? A BMI of 25 to 29.9 is considered overweight. ? A BMI of 30 and above is considered obese.   . Maintain normal blood lipids and cholesterol levels by exercising and minimizing your intake of saturated fat. Eat a balanced diet with plenty of fruit and vegetables. Blood tests for lipids and cholesterol should begin at age 89 and be repeated every 5 years. If your lipid or cholesterol levels are high, you are over 50, or you are at high risk for heart disease, you may need your cholesterol levels checked more frequently. Ongoing high lipid and cholesterol levels should be treated with medicines if diet and exercise are not working.  . If you  smoke, find out from your health care provider how to quit. If you do not use tobacco, please do not start.  . If you choose to drink alcohol, please do not consume more than 2 drinks per day. One drink is considered to be 12 ounces (355 mL) of beer, 5 ounces (148 mL) of wine, or 1.5 ounces (44 mL) of liquor.  . If you are 65-36 years old, ask your health care provider if you should take aspirin to prevent strokes.  . Use sunscreen. Apply sunscreen liberally and repeatedly throughout the day. You should seek shade when your  shadow is shorter than you. Protect yourself by wearing long sleeves, pants, a wide-brimmed hat, and sunglasses year round, whenever you are outdoors.  . Once a month, do a whole body skin exam, using a mirror to look at the skin on your back. Tell your health care provider of new moles, moles that have irregular borders, moles that are larger than a pencil eraser, or moles that have changed in shape or color.

## 2017-09-26 NOTE — Progress Notes (Signed)
Subjective:   Chad Huang is a 77 y.o. male who presents for Medicare Annual/Subsequent preventive examination.  Review of Systems:  N/A Cardiac Risk Factors include: advanced age (>37men, >57 women);male gender;obesity (BMI >30kg/m2);dyslipidemia;hypertension;family history of premature cardiovascular disease     Objective:    Vitals: BP 132/84 (BP Location: Right Arm, Patient Position: Sitting, Cuff Size: Normal)   Pulse (!) 53   Temp 97.9 F (36.6 C) (Oral)   Ht 5\' 9"  (1.753 m) Comment: no shoes  Wt 204 lb 12 oz (92.9 kg)   SpO2 96%   BMI 30.24 kg/m   Body mass index is 30.24 kg/m.  Advanced Directives 09/26/2017 09/22/2016 06/23/2012 06/13/2012  Does Patient Have a Medical Advance Directive? Yes Yes Patient has advance directive, copy in chart Patient has advance directive, copy in chart  Type of Advance Directive [place];Living will [place];Living will [place];Living will [place];Living will  Copy of [place] in Chart? No - copy requested Yes - -  Pre-existing out of facility DNR order (yellow form or pink MOST form) - - No No    Tobacco Social History   Tobacco Use  Smoking Status Former Smoker  . Packs/day: 1.00  . Years: 20.00  . Pack years: 20.00  . Types: Cigarettes  Smokeless Tobacco Current User  . Types: Chew  Tobacco Comment   quit in 1973     Ready to quit: No Counseling given: No Comment: quit in 1973   Clinical Intake:  Pre-visit preparation completed: Yes  Pain : No/denies pain Pain Score: 0-No pain     Nutritional Status: BMI > 30  Obese Nutritional Risks: None Diabetes: No  How often do you need to have someone help you when you read instructions, pamphlets, or other written materials from your doctor or pharmacy?: 1 - Never What is the last grade level you completed in school?: 12th grade + 2 vocational certificates  Interpreter  Needed?: No  Comments: pt is a widower and lives alone Information entered by :: LPinson, LPN  Past Medical History:  Diagnosis Date  . Alcohol abuse   . Anxiety and depression    charter, admission  . Back pain    ([facility]), microdiskectomy-great result  . Benign prostatic hypertrophy    per Dr Jeffie Pollock with uro  . Cataracts, bilateral   . Cerumen impaction    left  . Chest pain   . GERD (gastroesophageal reflux disease)   . Hearing loss   . Hiatal hernia    nodule  . HTN (hypertension)   . Hyperlipidemia, mixed   . Inguinal hernia    left, hx of, repair  . Internal hemorrhoids   . Left ear pain   . Muscle disorder    disorder, muscle/ligaminet nis, rt lower extremity  . Prostate cancer [facility] 2010   Dr Jeffie Pollock  . Skin cancer    hx, left ear   Past Surgical History:  Procedure Laterality Date  . back microdiskectomy  10/01   [facility]  . BACK SURGERY    . CARDIOVASCULAR STRESS TEST  2009   [place]  . COLONOSCOPY  5/05   polyps int hemorrhoids, repeat 10 yrs  . ESOPHAGOGASTRODUODENOSCOPY  03/02/00   gastritis, chronic  . ESOPHAGOGASTRODUODENOSCOPY  5/05   polyps in antrum, neg H Pylori  . ETT  05/19/07   Adenosine Muoview  . H Pylori positive  8/98  . HERNIA REPAIR    .  LAPAROSCOPIC INGUINAL HERNIA REPAIR  10/96  . levitin eval  02/05/99   hip pain  . LUMBAR SPINE SURGERY  5/00  . MRI  10/07/98   degen changes 9Dr. French Ana) steroid injections  . MYELOGRAM  9/01   stenosis L4-5   Family History  Problem Relation Age of Onset  . Prostate cancer Father   . Stroke Unknown        grandmother  . Lung cancer Sister   . Heart disease Brother    Social History   Socioeconomic History  . Marital status: Widowed    Spouse name: Not on file  . Number of children: Not on file  . Years of education: Not on file  . Highest education level: Not on file  Occupational History  . Not on file  Social Needs  . Financial resource strain: Not on file  . Food insecurity:      Worry: Not on file    Inability: Not on file  . Transportation needs:    Medical: Not on file    Non-medical: Not on file  Tobacco Use  . Smoking status: Former Smoker    Packs/day: 1.00    Years: 20.00    Pack years: 20.00    Types: Cigarettes  . Smokeless tobacco: Current User    Types: Chew  . Tobacco comment: quit in 1973  Substance and Sexual Activity  . Alcohol use: No    Comment: none since 1996  . Drug use: No  . Sexual activity: Not on file  Lifestyle  . Physical activity:    Days per week: Not on file    Minutes per session: Not on file  . Stress: Not on file  Relationships  . Social connections:    Talks on phone: Not on file    Gets together: Not on file    Attends religious service: Not on file    Active member of club or organization: Not on file    Attends meetings of clubs or organizations: Not on file    Relationship status: Not on file  Other Topics Concern  . Not on file  Social History Narrative   Married,    One daughter.    1 granddaughter.   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      [phone] (c?)    Outpatient Encounter Medications as of 09/26/2017  Medication Sig  . aspirin 81 MG tablet Take 81 mg by mouth daily.  . fluticasone (FLONASE) 50 MCG/ACT nasal spray PLACE 1 SPRAY INTO BOTH NOSTRILS 2 TIMES DAILY AS NEEDED FOR ALLERGIES OR RHINITIS.  Marland Kitchen metoprolol succinate (TOPROL-XL) 50 MG 24 hr tablet Take 1 tablet (50 mg total) by mouth daily. Take with or immediately following a meal.  . Multiple Vitamins-Minerals (MULTIVITAMIN ADULT PO) Take by mouth daily.  Marland Kitchen omeprazole (PRILOSEC) 40 MG capsule TAKE 1 CAPSULE BY MOUTH EVERY DAY  . Saw Palmetto 450 MG CAPS Take 1 capsule by mouth daily.  . simvastatin (ZOCOR) 40 MG tablet TAKE 1 TABLET BY MOUTH EVERY DAY IN THE EVENING  . [DISCONTINUED] predniSONE (DELTASONE) 10 MG tablet Take three tablets for 3 days, then two tablets for 3 days, then one tablet for 3 days.   No  facility-administered encounter medications on file as of 09/26/2017.     Activities of Daily Living In your present state of health, do you have any difficulty performing the following activities: 09/26/2017  Hearing? Y  Vision? N  Difficulty  concentrating or making decisions? Y  Walking or climbing stairs? N  Dressing or bathing? N  Doing errands, shopping? N  Preparing Food and eating ? N  Using the Toilet? N  In the past six months, have you accidently leaked urine? Y  Do you have problems with loss of bowel control? N  Managing your Medications? N  Managing your Finances? N  Housekeeping or managing your Housekeeping? N  Some recent data might be hidden    Patient Care Team: [name], NP as PCP - General (Internal Medicine) Thelma Comp, [place] as Consulting Physician (Optometry) Ladene Artist, MD as Consulting Physician (Gastroenterology) Rozetta Nunnery, MD as Consulting Physician (Otolaryngology) Irine Seal, MD as Attending Physician (Urology) Karie Chimera, MD as Referring Physician (Neurosurgery)   Assessment:   This is a routine wellness examination for [place].  Exercise Activities and Dietary recommendations Current Exercise Habits: Home exercise routine, Type of exercise: strength training/weights, Time (Minutes): 35, Frequency (Times/Week): 7, Weekly Exercise (Minutes/Week): 245, Exercise limited by: None identified  Goals    . Increase physical activity     When schedule permits, I will resume exercising for 30-45 minutes daily.        Fall Risk Fall Risk  09/26/2017 09/22/2016  Falls in the past year? No No   Depression Screen PHQ 2/9 Scores 09/26/2017 09/22/2016  PHQ - 2 Score 0 0  PHQ- 9 Score 0 -    Cognitive Function MMSE - Mini Mental State Exam 09/26/2017 09/22/2016  Orientation to time 5 5  Orientation to Place 5 5  Registration 3 3  Attention/ Calculation 0 5  Recall 3 3  Language- name 2 objects 0 0  Language- repeat 1 1    Language- follow 3 step command 3 3  Language- read & follow direction 0 0  Write a sentence 0 0  Copy design 0 0  Total score 20 25     PLEASE NOTE: A Mini-Cog screen was completed. Maximum score is 20. A value of 0 denotes this part of [place] MMSE was not completed or the patient failed this part of the Mini-Cog screening.   Mini-Cog Screening Orientation to Time - Max 5 pts Orientation to Place - Max 5 pts Registration - Max 3 pts Recall - Max 3 pts Language Repeat - Max 1 pts Language Follow 3 Step Command - Max 3 pts   Immunization History  Administered Date(s) Administered  . Influenza-Unspecified 05/31/2016  . Pneumococcal Conjugate-13 09/22/2016  . Pneumococcal Polysaccharide-23 05/17/2008  . Td 06/17/1993, 09/18/2008    Screening Tests Health Maintenance  Topic Date Due  . INFLUENZA VACCINE  12/15/2017  . TETANUS/TDAP  09/19/2018  . PNA vac Low Risk Adult  Completed     Plan:     I have personally reviewed, addressed, and noted the following in the patient's chart:  A. Medical and social history B. Use of alcohol, tobacco or illicit drugs  C. Current medications and supplements D. Functional ability and status E.  Nutritional status F.  Physical activity G. Advance directives H. List of other physicians I.  Hospitalizations, surgeries, and ER visits in previous 12 months J.  [place] to include hearing, vision, cognitive, depression L. Referrals and appointments - none  In addition, I have reviewed and discussed with patient certain preventive protocols, quality metrics, and best practice recommendations. A written personalized care plan for preventive services as well as general preventive health recommendations were provided to patient.  See  attached scanned questionnaire for additional information.   Signed,   Lindell Noe, MHA, BS, LPN Health Coach

## 2017-09-27 ENCOUNTER — Encounter: Payer: Self-pay | Admitting: *Deleted

## 2017-09-27 NOTE — Progress Notes (Signed)
I reviewed health advisor's note, was available for consultation, and agree with documentation and plan.  

## 2017-11-12 ENCOUNTER — Other Ambulatory Visit: Payer: Self-pay | Admitting: Primary Care

## 2017-12-02 ENCOUNTER — Other Ambulatory Visit: Payer: Self-pay | Admitting: Primary Care

## 2017-12-02 DIAGNOSIS — E785 Hyperlipidemia, unspecified: Secondary | ICD-10-CM

## 2017-12-04 ENCOUNTER — Other Ambulatory Visit: Payer: Self-pay | Admitting: Primary Care

## 2017-12-21 ENCOUNTER — Telehealth: Payer: Self-pay | Admitting: Primary Care

## 2017-12-21 DIAGNOSIS — K219 Gastro-esophageal reflux disease without esophagitis: Secondary | ICD-10-CM

## 2017-12-21 MED ORDER — PANTOPRAZOLE SODIUM 20 MG PO TBEC: DELAYED_RELEASE_TABLET | ORAL | 0 refills | Status: DC

## 2017-12-21 NOTE — Telephone Encounter (Signed)
I spoke with Threasa Beards (DPR signed); the omeprazole is not working any more;  Pt took one of his brother's pantoprazole and had no heartburn or difficulty eating foods. Pt request pantoprazole to [facility]. Pt had annual exam on 09/26/17.

## 2017-12-21 NOTE — Telephone Encounter (Signed)
Noted, will discontinue omeprazole and change to pantoprazole.

## 2017-12-21 NOTE — Telephone Encounter (Signed)
Copied from [place]. Topic: Quick Communication - Rx Refill/Question >> Dec 21, 2017  9:10 AM Oliver Pila B wrote: Medication: omeprazole (PRILOSEC) 40 MG capsule [[id number]]   Pt's daughter called b/c the pt is wanting to discuss changing the medication above to a different one, contact pt to advise

## 2018-03-03 ENCOUNTER — Other Ambulatory Visit: Payer: Self-pay | Admitting: Primary Care

## 2018-03-05 ENCOUNTER — Other Ambulatory Visit: Payer: Self-pay | Admitting: Primary Care

## 2018-03-05 DIAGNOSIS — K219 Gastro-esophageal reflux disease without esophagitis: Secondary | ICD-10-CM

## 2018-05-17 ENCOUNTER — Other Ambulatory Visit: Payer: Self-pay | Admitting: Primary Care

## 2018-05-17 DIAGNOSIS — K219 Gastro-esophageal reflux disease without esophagitis: Secondary | ICD-10-CM

## 2018-05-30 ENCOUNTER — Other Ambulatory Visit: Payer: Self-pay | Admitting: Primary Care

## 2018-06-05 ENCOUNTER — Other Ambulatory Visit: Payer: Self-pay | Admitting: Primary Care

## 2018-08-24 ENCOUNTER — Other Ambulatory Visit: Payer: Self-pay | Admitting: Primary Care

## 2018-09-21 ENCOUNTER — Telehealth: Payer: Self-pay | Admitting: Primary Care

## 2018-09-21 NOTE — Telephone Encounter (Signed)
melaine (daughter) called wanting to know if you could change protonix 20mg  to 40mg   Pt has appointment with you on 5/28  [facility] [address] [employer]  Best number [phone]

## 2018-09-22 NOTE — Telephone Encounter (Signed)
Message left for patient to return my call.  

## 2018-09-22 NOTE — Telephone Encounter (Signed)
Appointment 5/13 °

## 2018-09-22 NOTE — Telephone Encounter (Signed)
Please have patient meet virtually at his convenience with me to discuss.

## 2018-09-27 ENCOUNTER — Encounter: Payer: Self-pay | Admitting: Primary Care

## 2018-09-27 ENCOUNTER — Ambulatory Visit (INDEPENDENT_AMBULATORY_CARE_PROVIDER_SITE_OTHER): Payer: Medicare Other | Admitting: Primary Care

## 2018-09-27 VITALS — BP 132/78 | HR 80 | Temp 97.5°F | Ht 69.0 in | Wt 216.0 lb

## 2018-09-27 DIAGNOSIS — C61 Malignant neoplasm of prostate: Secondary | ICD-10-CM

## 2018-09-27 DIAGNOSIS — I10 Essential (primary) hypertension: Secondary | ICD-10-CM | POA: Diagnosis not present

## 2018-09-27 DIAGNOSIS — K219 Gastro-esophageal reflux disease without esophagitis: Secondary | ICD-10-CM

## 2018-09-27 DIAGNOSIS — E782 Mixed hyperlipidemia: Secondary | ICD-10-CM | POA: Diagnosis not present

## 2018-09-27 MED ORDER — PANTOPRAZOLE SODIUM 40 MG PO TBEC: 40.0000 mg | DELAYED_RELEASE_TABLET | Freq: Every day | ORAL | 1 refills | Status: DC

## 2018-09-27 NOTE — Assessment & Plan Note (Signed)
Following with Urology, plans on scheduling a follow up visit for later this Summer. PSA pending.

## 2018-09-27 NOTE — Patient Instructions (Signed)
We've increased the dose of your pantoprazole to 40 mg daily. Only take one of these tablets when you pick it up.  Schedule a lab only appointment for labs as discussed.  Call your Urologist for a follow up visit when able.  It was a pleasure to see you today! Allie Bossier, NP-C

## 2018-09-27 NOTE — Assessment & Plan Note (Signed)
Compliant to Simvastatin, continue same.  Repeat lipids and CMP pending.

## 2018-09-27 NOTE — Assessment & Plan Note (Signed)
Improved on pantoprazole but with breakthrough reflux. Increase dose to 40 mg daily as this is effective. Discussed triggers. New Rx sent to pharmacy.

## 2018-09-27 NOTE — Assessment & Plan Note (Signed)
Stable from reading today at home. Continue metoprolol succinate 50 mg. BMP pending.

## 2018-09-27 NOTE — Progress Notes (Signed)
Subjective:    Patient ID: Chad Huang, male    DOB: 24-Dec-1940, 78 y.o.   MRN: [id number]  HPI  Virtual Visit via Video Note  I connected with Chad Huang on 09/27/18 at  2:20 PM EDT by a video enabled telemedicine application and verified that I am speaking with the correct person using two identifiers.  Location: Patient: Home Provider: Office   I discussed the limitations of evaluation and management by telemedicine and the availability of in person appointments. The patient expressed understanding and agreed to proceed.  History of Present Illness:  Chad Huang is a 78 year old male with a history of GERD, hypertension, prostate cancer who presents today with a chief complaint of esophageal reflux. He is also due for follow up. His family member is with him as well who is providing information for the HPI.  1) GERD: He is currently managed on pantoprazole 20 mg daily. Once managed on omeprazole 40 mg but found this to be ineffective so he was switched to pantoprazole 20 mg. Since then he's noticed some improvement but will have flares of esophageal burning. He has recently been taking two of his pantoprazole 20 mg tablets which has helped to resolve symptoms. He would like a refill but for the 40 mg dose.   2) Essential Hypertension: Currently managed on metoprolol succinate 50 mg. He denies chest pain, shortness of breath, dizziness, visual changes.  BP Readings from Last 3 Encounters:  09/27/18 132/78  09/26/17 132/84  09/26/17 132/84   3) Hyperlipidemia: Currently managed on simvastatin 40 mg daily. He is compliant. His last lipid panel was in May of 2019 with LDL of 53.  4) Prostate Cancer: Following with Urology, no recent visit given Covid-19 pandemic. He does plan on scheduling an appointment for later this summer.    Observations/Objective:  Alert and oriented. Appears well, not sickly. No distress. Speaking in complete sentences.   Assessment and Plan:  See  problem based charting  Follow Up Instructions:  We've increased the dose of your pantoprazole to 40 mg daily. Only take one of these tablets when you pick it up.  Schedule a lab only appointment for labs as discussed.  Call your Urologist for a follow up visit when able.  It was a pleasure to see you today! Allie Bossier, NP-C    I discussed the assessment and treatment plan with the patient. The patient was provided an opportunity to ask questions and all were answered. The patient agreed with the plan and demonstrated an understanding of the instructions.   The patient was advised to call back or seek an in-person evaluation if the symptoms worsen or if the condition fails to improve as anticipated.     [name], NP    Review of Systems  Eyes: Negative for visual disturbance.  Respiratory: Negative for shortness of breath.   Cardiovascular: Negative for chest pain.  Gastrointestinal:       Esophageal burning  Neurological: Negative for dizziness and headaches.       Past Medical History:  Diagnosis Date  . Alcohol abuse   . Anxiety and depression    charter, admission  . Back pain    ([facility]rit[facility]), microdiskectomy-great result  . Benign prostatic hypertrophy    per Dr Jeffie Pollock with uro  . Cataracts, bilateral   . Cerumen impaction    left  . Chest pain   . GERD (gastroesophageal reflux disease)   . Hearing loss   .  Hiatal hernia    nodule  . HTN (hypertension)   . Hyperlipidemia, mixed   . Inguinal hernia    left, hx of, repair  . Internal hemorrhoids   . Left ear pain   . Muscle disorder    disorder, muscle/ligaminet nis, rt lower extremity  . Prostate cancer [facility]) 2010   Dr Jeffie Pollock  . Skin cancer    hx, left ear     Social History   Socioeconomic History  . Marital status: Widowed    Spouse name: Not on file  . Number of children: Not on file  . Years of education: Not on file  . Highest education level: Not on file  Occupational History  .  Not on file  Social Needs  . Financial resource strain: Not on file  . Food insecurity:    Worry: Not on file    Inability: Not on file  . Transportation needs:    Medical: Not on file    Non-medical: Not on file  Tobacco Use  . Smoking status: Former Smoker    Packs/day: 1.00    Years: 20.00    Pack years: 20.00    Types: Cigarettes  . Smokeless tobacco: Current User    Types: Chew  . Tobacco comment: quit in 1973  Substance and Sexual Activity  . Alcohol use: No    Comment: none since 1996  . Drug use: No  . Sexual activity: Not on file  Lifestyle  . Physical activity:    Days per week: Not on file    Minutes per session: Not on file  . Stress: Not on file  Relationships  . Social connections:    Talks on phone: Not on file    Gets together: Not on file    Attends religious service: Not on file    Active member of club or organization: Not on file    Attends meetings of clubs or organizations: Not on file    Relationship status: Not on file  . Intimate partner violence:    Fear of current or ex partner: Not on file    Emotionally abused: Not on file    Physically abused: Not on file    Forced sexual activity: Not on file  Other Topics Concern  . Not on file  Social History Narrative   Married,    One daughter.    1 granddaughter.   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      [phone] (c?)    Past Surgical History:  Procedure Laterality Date  . back microdiskectomy  10/01   [facility]  . BACK SURGERY    . CARDIOVASCULAR STRESS TEST  2009   [place]  . COLONOSCOPY  5/05   polyps int hemorrhoids, repeat 10 yrs  . ESOPHAGOGASTRODUODENOSCOPY  03/02/00   gastritis, chronic  . ESOPHAGOGASTRODUODENOSCOPY  5/05   polyps in antrum, neg H Pylori  . ETT  05/19/07   Adenosine Muoview  . H Pylori positive  8/98  . HERNIA REPAIR    . LAPAROSCOPIC INGUINAL HERNIA REPAIR  10/96  . levitin eval  02/05/99   hip pain  . LUMBAR SPINE SURGERY  5/00  . MRI   10/07/98   degen changes 9Dr. French Ana) steroid injections  . MYELOGRAM  9/01   stenosis L4-5    Family History  Problem Relation Age of Onset  . Prostate cancer Father   . Stroke Unknown        grandmother  .  Lung cancer Sister   . Heart disease Brother     Allergies  Allergen Reactions  . Clarithromycin     REACTION: unspecified  . Codeine Phosphate     REACTION: nausea  . Nabumetone     REACTION: Muscle cramps    Current Outpatient Medications on File Prior to Visit  Medication Sig Dispense Refill  . aspirin 81 MG tablet Take 81 mg by mouth daily.    . fluticasone (FLONASE) 50 MCG/ACT nasal spray PLACE 1 SPRAY INTO BOTH NOSTRILS 2 TIMES DAILY AS NEEDED FOR ALLERGIES OR RHINITIS. 48 g 1  . metoprolol succinate (TOPROL-XL) 50 MG 24 hr tablet TAKE 1 TABLET BY MOUTH DAILY. TAKE WITH OR IMMEDIATELY FOLLOWING A MEAL. 90 tablet 3  . Multiple Vitamins-Minerals (MULTIVITAMIN ADULT PO) Take by mouth daily.    . Saw Palmetto 450 MG CAPS Take 1 capsule by mouth daily.    . simvastatin (ZOCOR) 40 MG tablet TAKE 1 TABLET BY MOUTH EVERY DAY IN THE EVENING 90 tablet 1   No current facility-administered medications on file prior to visit.     BP 132/78   Pulse 80   Temp (!) 97.5 F (36.4 C) (Oral)   Ht 5\' 9"  (1.753 m)   Wt 216 lb (98 kg)   BMI 31.90 kg/m    Objective:   Physical Exam  Constitutional: He is oriented to person, place, and time. He appears well-nourished.  Respiratory: Effort normal.  Neurological: He is alert and oriented to person, place, and time.  Psychiatric: He has a normal mood and affect.           Assessment & Plan:

## 2018-09-29 ENCOUNTER — Other Ambulatory Visit (INDEPENDENT_AMBULATORY_CARE_PROVIDER_SITE_OTHER): Payer: Medicare Other

## 2018-09-29 ENCOUNTER — Other Ambulatory Visit: Payer: Self-pay | Admitting: Primary Care

## 2018-09-29 DIAGNOSIS — C61 Malignant neoplasm of prostate: Secondary | ICD-10-CM | POA: Diagnosis not present

## 2018-09-29 DIAGNOSIS — E782 Mixed hyperlipidemia: Secondary | ICD-10-CM

## 2018-09-29 DIAGNOSIS — I10 Essential (primary) hypertension: Secondary | ICD-10-CM | POA: Diagnosis not present

## 2018-09-29 DIAGNOSIS — N289 Disorder of kidney and ureter, unspecified: Secondary | ICD-10-CM

## 2018-09-29 LAB — COMPREHENSIVE METABOLIC PANEL
ALT: 9 U/L (ref 0–53)
AST: 13 U/L (ref 0–37)
Albumin: 4 g/dL (ref 3.5–5.2)
Alkaline Phosphatase: 67 U/L (ref 39–117)
BUN: 18 mg/dL (ref 6–23)
CO2: 27 mEq/L (ref 19–32)
Calcium: 8.6 mg/dL (ref 8.4–10.5)
Chloride: 106 mEq/L (ref 96–112)
Creatinine, Ser: 1.31 mg/dL (ref 0.40–1.50)
GFR: 52.98 mL/min — ABNORMAL LOW (ref >60.00)
Glucose, Bld: 89 mg/dL (ref 70–99)
Potassium: 4.6 mEq/L (ref 3.5–5.1)
Sodium: 140 mEq/L (ref 135–145)
Total Bilirubin: 0.7 mg/dL (ref 0.2–1.2)
Total Protein: 5.8 g/dL — ABNORMAL LOW (ref 6.0–8.3)

## 2018-09-29 LAB — LIPID PANEL
Cholesterol: 106 mg/dL (ref 0–200)
HDL: 31 mg/dL — ABNORMAL LOW (ref >39.00)
LDL Cholesterol: 63 mg/dL (ref 0–99)
NonHDL: 75.4
Total CHOL/HDL Ratio: 3
Triglycerides: 64 mg/dL (ref 0.0–149.0)
VLDL: 12.8 mg/dL (ref 0.0–40.0)

## 2018-09-29 LAB — PSA: PSA: 5.15 ng/mL — ABNORMAL HIGH (ref 0.10–4.00)

## 2018-10-11 ENCOUNTER — Telehealth: Payer: Self-pay | Admitting: Primary Care

## 2018-10-11 ENCOUNTER — Ambulatory Visit (INDEPENDENT_AMBULATORY_CARE_PROVIDER_SITE_OTHER): Payer: Medicare Other

## 2018-10-11 VITALS — BP 130/74 | HR 41 | Temp 97.7°F

## 2018-10-11 DIAGNOSIS — Z0000 Encounter for general adult medical examination without abnormal findings: Secondary | ICD-10-CM

## 2018-10-11 NOTE — Progress Notes (Signed)
Subjective:   Chad Huang is a 78 y.o. male who presents for Medicare Annual/Subsequent preventive examination.  Review of Systems:  N/A Cardiac Risk Factors include: advanced age (>69men, >61 women);dyslipidemia;hypertension;male gender;obesity (BMI >30kg/m2);smoking/ tobacco exposure     Objective:    Vitals: BP 130/74 Comment: patient supplied  Pulse (!) 41 Comment: patient supplied  Temp 97.7 F (36.5 C) Comment: patient supplied  There is no height or weight on file to calculate BMI.  Advanced Directives 10/11/2018 09/26/2017 09/22/2016 06/23/2012 06/13/2012  Does Patient Have a Medical Advance Directive? Yes Yes Yes Patient has advance directive, copy in chart Patient has advance directive, copy in chart  Type of Advance Directive [place];Living will [place];Living will [place];Living will [place];Living will [place];Living will  Copy of [place] in Chart? No - copy requested No - copy requested Yes - -  Pre-existing out of facility DNR order (yellow form or pink MOST form) - - - No No    Tobacco Social History   Tobacco Use  Smoking Status Former Smoker  . Packs/day: 1.00  . Years: 20.00  . Pack years: 20.00  . Types: Cigarettes  Smokeless Tobacco Current User  . Types: Chew  Tobacco Comment   quit in 1973     Ready to quit: No Counseling given: No Comment: quit in 1973   Clinical Intake:  Pre-visit preparation completed: Yes        Nutritional Status: BMI > 30  Obese Nutritional Risks: None Diabetes: No  How often do you need to have someone help you when you read instructions, pamphlets, or other written materials from your doctor or pharmacy?: 1 - Never What is the last grade level you completed in school?: 12th grade + 5 yrs vocational school  Interpreter Needed?: No  Comments: pt lives independently Information entered by ::  LPinson, RN  Past Medical History:  Diagnosis Date  . Alcohol abuse   . Anxiety and depression    charter, admission  . Back pain    ([facility]), microdiskectomy-great result  . Benign prostatic hypertrophy    per Dr Jeffie Pollock with uro  . Cataracts, bilateral   . Cerumen impaction    left  . Chest pain   . GERD (gastroesophageal reflux disease)   . Hearing loss   . Hiatal hernia    nodule  . HTN (hypertension)   . Hyperlipidemia, mixed   . Inguinal hernia    left, hx of, repair  . Internal hemorrhoids   . Left ear pain   . Muscle disorder    disorder, muscle/ligaminet nis, rt lower extremity  . Prostate cancer [facility]) 2010   Dr Jeffie Pollock  . Skin cancer    hx, left ear   Past Surgical History:  Procedure Laterality Date  . back microdiskectomy  10/01   [facility]  . BACK SURGERY    . CARDIOVASCULAR STRESS TEST  2009   [place]  . COLONOSCOPY  5/05   polyps int hemorrhoids, repeat 10 yrs  . ESOPHAGOGASTRODUODENOSCOPY  03/02/00   gastritis, chronic  . ESOPHAGOGASTRODUODENOSCOPY  5/05   polyps in antrum, neg H Pylori  . ETT  05/19/07   Adenosine Muoview  . H Pylori positive  8/98  . HERNIA REPAIR    . LAPAROSCOPIC INGUINAL HERNIA REPAIR  10/96  . levitin eval  02/05/99   hip pain  . LUMBAR SPINE SURGERY  5/00  .  MRI  10/07/98   degen changes 9Dr. French Ana) steroid injections  . MYELOGRAM  9/01   stenosis L4-5   Family History  Problem Relation Age of Onset  . Prostate cancer Father   . Stroke Other        grandmother  . Lung cancer Sister   . Heart disease Brother    Social History   Socioeconomic History  . Marital status: Widowed    Spouse name: Not on file  . Number of children: Not on file  . Years of education: Not on file  . Highest education level: Not on file  Occupational History  . Not on file  Social Needs  . Financial resource strain: Not on file  . Food insecurity:    Worry: Not on file    Inability: Not on file  . Transportation needs:     Medical: Not on file    Non-medical: Not on file  Tobacco Use  . Smoking status: Former Smoker    Packs/day: 1.00    Years: 20.00    Pack years: 20.00    Types: Cigarettes  . Smokeless tobacco: Current User    Types: Chew  . Tobacco comment: quit in 1973  Substance and Sexual Activity  . Alcohol use: No    Comment: none since 1996  . Drug use: No  . Sexual activity: Not Currently  Lifestyle  . Physical activity:    Days per week: Not on file    Minutes per session: Not on file  . Stress: Not on file  Relationships  . Social connections:    Talks on phone: Not on file    Gets together: Not on file    Attends religious service: Not on file    Active member of club or organization: Not on file    Attends meetings of clubs or organizations: Not on file    Relationship status: Not on file  Other Topics Concern  . Not on file  Social History Narrative   Married,    One daughter.    1 granddaughter.   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      [phone] (c?)    Outpatient Encounter Medications as of 10/11/2018  Medication Sig  . aspirin 81 MG tablet Take 81 mg by mouth daily.  . fluticasone (FLONASE) 50 MCG/ACT nasal spray PLACE 1 SPRAY INTO BOTH NOSTRILS 2 TIMES DAILY AS NEEDED FOR ALLERGIES OR RHINITIS.  Marland Kitchen metoprolol succinate (TOPROL-XL) 50 MG 24 hr tablet TAKE 1 TABLET BY MOUTH DAILY. TAKE WITH OR IMMEDIATELY FOLLOWING A MEAL.  . pantoprazole (PROTONIX) 40 MG tablet Take 1 tablet (40 mg total) by mouth daily. For heartburn.  . simvastatin (ZOCOR) 40 MG tablet TAKE 1 TABLET BY MOUTH EVERY DAY IN THE EVENING  . [DISCONTINUED] Multiple Vitamins-Minerals (MULTIVITAMIN ADULT PO) Take by mouth daily.  . [DISCONTINUED] Saw Palmetto 450 MG CAPS Take 1 capsule by mouth daily.   No facility-administered encounter medications on file as of 10/11/2018.     Activities of Daily Living In your present state of health, do you have any difficulty performing the following  activities: 10/11/2018  Hearing? Y  Vision? N  Difficulty concentrating or making decisions? N  Walking or climbing stairs? N  Dressing or bathing? N  Doing errands, shopping? N  Preparing Food and eating ? N  Using the Toilet? N  In the past six months, have you accidently leaked urine? N  Do you have problems  with loss of bowel control? N  Managing your Medications? N  Managing your Finances? N  Housekeeping or managing your Housekeeping? N  Some recent data might be hidden    Patient Care Team: [name], NP as PCP - General (Internal Medicine) Thelma Comp, [place] as Consulting Physician (Optometry) Ladene Artist, MD as Consulting Physician (Gastroenterology) Rozetta Nunnery, MD as Consulting Physician (Otolaryngology) Irine Seal, MD as Attending Physician (Urology) Karie Chimera, MD as Referring Physician (Neurosurgery)   Assessment:   This is a routine wellness examination for Jaquin.  Vision Screening Comments: Vision exam in March 2020 with Dr. Maryruth Hancock B.   Exercise Activities and Dietary recommendations Current Exercise Habits: Home exercise routine, Type of exercise: walking, Time (Minutes): 30, Frequency (Times/Week): 7, Weekly Exercise (Minutes/Week): 210, Intensity: Mild, Exercise limited by: None identified  Goals    . Patient Stated     Starting 10/11/18, I will continue to take medications as prescribed.        Fall Risk Fall Risk  10/11/2018 09/26/2017 09/22/2016  Falls in the past year? 0 No No   Depression Screen PHQ 2/9 Scores 10/11/2018 09/26/2017 09/22/2016  PHQ - 2 Score 0 0 0  PHQ- 9 Score 0 0 -    Cognitive Function MMSE - Mini Mental State Exam 10/11/2018 09/26/2017 09/22/2016  Orientation to time 5 5 5   Orientation to Place 5 5 5   Registration 3 3 3   Attention/ Calculation 0 0 5  Recall 3 3 3   Language- name 2 objects 0 0 0  Language- repeat 1 1 1   Language- follow 3 step command 0 3 3  Language- read & follow direction 0 0 0   Write a sentence 0 0 0  Copy design 0 0 0  Total score 17 20 25        PLEASE NOTE: A Mini-Cog screen was completed. Maximum score is 17. A value of 0 denotes this part of [place] MMSE was not completed or the patient failed this part of the Mini-Cog screening.   Mini-Cog Screening Orientation to Time - Max 5 pts Orientation to Place - Max 5 pts Registration - Max 3 pts Recall - Max 3 pts Language Repeat - Max 1 pts    Immunization History  Administered Date(s) Administered  . Influenza-Unspecified 05/31/2016  . Pneumococcal Conjugate-13 09/22/2016  . Pneumococcal Polysaccharide-23 05/17/2008  . Td 06/17/1993, 09/18/2008   Screening Tests Health Maintenance  Topic Date Due  . TETANUS/TDAP  05/16/2020 (Originally 09/19/2018)  . INFLUENZA VACCINE  12/16/2018  . PNA vac Low Risk Adult  Completed     Plan:     I have personally reviewed, addressed, and noted the following in the patient's chart:  A. Medical and social history B. Use of alcohol, tobacco or illicit drugs  C. Current medications and supplements D. Functional ability and status E.  Nutritional status F.  Physical activity G. Advance directives H. List of other physicians I.  Hospitalizations, surgeries, and ER visits in previous 12 months J.  Vitals (unless it is a telemedicine encounter) K. Screenings to include cognitive, depression, hearing, vision (NOTE: hearing and vision screenings not completed in telemedicine encounter) L. Referrals and appointments   In addition, I have reviewed and discussed with patient certain preventive protocols, quality metrics, and best practice recommendations. A written personalized care plan for preventive services and recommendations were provided to patient.  With patient's permission, we connected on 10/11/18 at  1:00 PM EDT by a video enabled  telemedicine application. Two patient identifiers were used to ensure the encounter occurred with the correct person.    Patient  was in home and writer was in office.   Signed,   Lindell Noe, MHA, BS, LPN Health Coach

## 2018-10-11 NOTE — Telephone Encounter (Signed)
Patient's daughter, Threasa Beards,  is requesting a call from Anda Kraft or Vallarie Mare after patient's visit tomorrow.  Threasa Beards said he's hard of hearing and can't communicate what happened at the visit.  She'll try to get him to wear hearing aid.

## 2018-10-11 NOTE — Telephone Encounter (Signed)
Spoken to patient's daughter

## 2018-10-11 NOTE — Progress Notes (Signed)
PCP notes:   Health maintenance:  Tetanus vaccine - postponed/insurance  Abnormal screenings:   None  Patient concerns:   Increased SOB, intermittent chest pain, and abnormal vital signs. Initial onset approx. 2 wks ago. PCP notified. Patient has next day appt with PCP and states he does not need a same day appt.  Nurse concerns:  None  Next PCP appt:   10/12/18 @ 1100

## 2018-10-11 NOTE — Patient Instructions (Signed)
[name] , Thank you for taking time to come for your Medicare Wellness Visit. I appreciate your ongoing commitment to your health goals. Please review the following plan we discussed and let me know if I can assist you in the future.   These are the goals we discussed: Goals    . Patient Stated     Starting 10/11/18, I will continue to take medications as prescribed.        This is a list of the screening recommended for you and due dates:  Health Maintenance  Topic Date Due  . Tetanus Vaccine  05/16/2020*  . Flu Shot  12/16/2018  . Pneumonia vaccines  Completed  *Topic was postponed. The date shown is not the original due date.   Preventive Care for Adults  A healthy lifestyle and preventive care can promote health and wellness. Preventive health guidelines for adults include the following key practices.  . A routine yearly physical is a good way to check with your health care provider about your health and preventive screening. It is a chance to share any concerns and updates on your health and to receive a thorough exam.  . Visit your dentist for a routine exam and preventive care every 6 months. Brush your teeth twice a day and floss once a day. Good oral hygiene prevents tooth decay and gum disease.  . The frequency of eye exams is based on your age, health, family medical history, use  of contact lenses, and other factors. Follow your health care provider's recommendations for frequency of eye exams.  . Eat a healthy diet. Foods like vegetables, fruits, whole grains, low-fat dairy products, and lean protein foods contain the nutrients you need without too many calories. Decrease your intake of foods high in solid fats, added sugars, and salt. Eat the right amount of calories for you. Get information about a proper diet from your health care provider, if necessary.  . Regular physical exercise is one of the most important things you can do for your health. Most adults should get  at least 150 minutes of moderate-intensity exercise (any activity that increases your heart rate and causes you to sweat) each week. In addition, most adults need muscle-strengthening exercises on 2 or more days a week.  Silver Sneakers may be a benefit available to you. To determine eligibility, you may visit the website: www.silversneakers.com or contact program at 810-233-0309 Mon-Fri between 8AM-8PM.   . Maintain a healthy weight. The body mass index (BMI) is a screening tool to identify possible weight problems. It provides an estimate of body fat based on height and weight. Your health care provider can find your BMI and can help you achieve or maintain a healthy weight.   For adults 20 years and older: ? A BMI below 18.5 is considered underweight. ? A BMI of 18.5 to 24.9 is normal. ? A BMI of 25 to 29.9 is considered overweight. ? A BMI of 30 and above is considered obese.   . Maintain normal blood lipids and cholesterol levels by exercising and minimizing your intake of saturated fat. Eat a balanced diet with plenty of fruit and vegetables. Blood tests for lipids and cholesterol should begin at age 3 and be repeated every 5 years. If your lipid or cholesterol levels are high, you are over 50, or you are at high risk for heart disease, you may need your cholesterol levels checked more frequently. Ongoing high lipid and cholesterol levels should be treated with medicines  if diet and exercise are not working.  . If you smoke, find out from your health care provider how to quit. If you do not use tobacco, please do not start.  . If you choose to drink alcohol, please do not consume more than 2 drinks per day. One drink is considered to be 12 ounces (355 mL) of beer, 5 ounces (148 mL) of wine, or 1.5 ounces (44 mL) of liquor.  . If you are 78-50 years old, ask your health care provider if you should take aspirin to prevent strokes.  . Use sunscreen. Apply sunscreen liberally and repeatedly  throughout the day. You should seek shade when your shadow is shorter than you. Protect yourself by wearing long sleeves, pants, a wide-brimmed hat, and sunglasses year round, whenever you are outdoors.  . Once a month, do a whole body skin exam, using a mirror to look at the skin on your back. Tell your health care provider of new moles, moles that have irregular borders, moles that are larger than a pencil eraser, or moles that have changed in shape or color.

## 2018-10-12 ENCOUNTER — Encounter: Payer: Self-pay | Admitting: Primary Care

## 2018-10-12 ENCOUNTER — Other Ambulatory Visit: Payer: Self-pay

## 2018-10-12 ENCOUNTER — Ambulatory Visit (INDEPENDENT_AMBULATORY_CARE_PROVIDER_SITE_OTHER)
Admission: RE | Admit: 2018-10-12 | Discharge: 2018-10-12 | Disposition: A | Discharge status: Home / Self Care | Payer: Medicare Other | Source: Ambulatory Visit | Attending: Primary Care | Admitting: Primary Care

## 2018-10-12 ENCOUNTER — Ambulatory Visit: Payer: Medicare Other

## 2018-10-12 ENCOUNTER — Ambulatory Visit (INDEPENDENT_AMBULATORY_CARE_PROVIDER_SITE_OTHER): Payer: Medicare Other | Admitting: Primary Care

## 2018-10-12 VITALS — BP 144/90 | HR 60 | Temp 98.2°F | Ht 69.0 in | Wt 219.5 lb

## 2018-10-12 DIAGNOSIS — R0789 Other chest pain: Secondary | ICD-10-CM

## 2018-10-12 DIAGNOSIS — K219 Gastro-esophageal reflux disease without esophagitis: Secondary | ICD-10-CM

## 2018-10-12 DIAGNOSIS — E782 Mixed hyperlipidemia: Secondary | ICD-10-CM

## 2018-10-12 DIAGNOSIS — I10 Essential (primary) hypertension: Secondary | ICD-10-CM | POA: Diagnosis not present

## 2018-10-12 DIAGNOSIS — Z0000 Encounter for general adult medical examination without abnormal findings: Secondary | ICD-10-CM

## 2018-10-12 DIAGNOSIS — Z0001 Encounter for general adult medical examination with abnormal findings: Secondary | ICD-10-CM

## 2018-10-12 DIAGNOSIS — C61 Malignant neoplasm of prostate: Secondary | ICD-10-CM

## 2018-10-12 DIAGNOSIS — H9190 Unspecified hearing loss, unspecified ear: Secondary | ICD-10-CM

## 2018-10-12 MED ORDER — AMLODIPINE BESYLATE 5 MG PO TABS: 5.0000 mg | ORAL_TABLET | Freq: Every day | ORAL | 0 refills | Status: DC

## 2018-10-12 NOTE — Assessment & Plan Note (Signed)
Following with Dr. Jeffie Pollock through Urology, advised that he update him with his most recent PSA reading.

## 2018-10-12 NOTE — Assessment & Plan Note (Signed)
Immunizations UTD. PSA stable, he will update Urology. Refuses colonoscopy given age, this is reasonable. Encouraged regular exercise, healthy diet.  Exam stable. Labs reviewed. Follow up in 1 year for CPE.

## 2018-10-12 NOTE — Assessment & Plan Note (Signed)
Unclear cause, GERD symptoms have improved since increase in pantoprazole.   ECG today with marked bradycardia, will be discontinuing beta blocker. No ST changes, T-wave changes.   Check chest xray today. Consider Covid-19 testing, although his exam seems benign. Await results.

## 2018-10-12 NOTE — Progress Notes (Signed)
Subjective:    Patient ID: Chad Huang, male    DOB: November 23, 1940, 78 y.o.   MRN: [id number]  HPI  Chad Huang is a 78 year old male who presents today for complete physical, he also reports chest discomfort.  His chest discomfort is located to the substernal region and right lower chest which initially began four days ago. Also with some shortness of breath, fatigue, mild post nasal drip, and a cough. His cough is non productive. His discomfort will occur and rest and exertion for which he describes as a pressure.   He is managed on pantoprazole 40 mg daily which was increased several weeks ago for breakthrough reflux. He's recently been taking Mucinex and using Flonase with  Improvement in cough and a reduction in nasal congestion. Since then has noticed improvement. He denies fevers, productive cough.   He has been to the grocery store during senior hours, occasionally to the [employer]. He has been around his 70 year old friend a few times as he takes care of his yard and takes him to church. He denies contact with large crowds.    Immunizations: -Tetanus: Completed in 2010 -Influenza: Due this season  -Pneumonia: Completed in 2018  Exercise: He is exercising at home with weights and push ups, some walking Eye exam: Completed in March 2020 Dental exam: No recent exam Colonoscopy: Completed in 2005 PSA: 5.15 in mid May 2020, increased from 4.72 in May 2019. 5.07 in May 2018. Follows with Dr. Jeffie Pollock through Urology.   BP Readings from Last 3 Encounters:  10/12/18 (!) 144/90  10/11/18 130/74  09/27/18 132/78    He is checking his BP at home which is running 120-140's/70's-80's. His heart rate is running from high 40's-low 60's on average.   Review of Systems  Constitutional: Positive for fatigue. Negative for chills and fever.  HENT: Positive for congestion and postnasal drip.   Eyes: Negative for visual disturbance.  Respiratory: Positive for cough and shortness of breath.    Cardiovascular: Positive for chest pain. Negative for palpitations and leg swelling.  Gastrointestinal: Negative for abdominal pain.  Genitourinary: Negative for difficulty urinating.  Musculoskeletal: Negative for myalgias.  Skin: Negative for color change.  Allergic/Immunologic: Positive for environmental allergies.  Neurological: Negative for dizziness and headaches.  Psychiatric/Behavioral: The patient is not nervous/anxious.        Past Medical History:  Diagnosis Date  . Alcohol abuse   . Anxiety and depression    charter, admission  . Back pain    ([place]), microdiskectomy-great result  . Benign prostatic hypertrophy    per Dr Jeffie Pollock with uro  . Cataracts, bilateral   . Cerumen impaction    left  . Chest pain   . GERD (gastroesophageal reflux disease)   . Hearing loss   . Hiatal hernia    nodule  . HTN (hypertension)   . Hyperlipidemia, mixed   . Inguinal hernia    left, hx of, repair  . Internal hemorrhoids   . Left ear pain   . Muscle disorder    disorder, muscle/ligaminet nis, rt lower extremity  . Prostate cancer [facility]) 2010   Dr Jeffie Pollock  . Skin cancer    hx, left ear     Social History   Socioeconomic History  . Marital status: Widowed    Spouse name: Not on file  . Number of children: Not on file  . Years of education: Not on file  . Highest education level: Not on  file  Occupational History  . Not on file  Social Needs  . Financial resource strain: Not on file  . Food insecurity:    Worry: Not on file    Inability: Not on file  . Transportation needs:    Medical: Not on file    Non-medical: Not on file  Tobacco Use  . Smoking status: Former Smoker    Packs/day: 1.00    Years: 20.00    Pack years: 20.00    Types: Cigarettes  . Smokeless tobacco: Current User    Types: Chew  . Tobacco comment: quit in 1973  Substance and Sexual Activity  . Alcohol use: No    Comment: none since 1996  . Drug use: No  . Sexual activity: Not Currently   Lifestyle  . Physical activity:    Days per week: Not on file    Minutes per session: Not on file  . Stress: Not on file  Relationships  . Social connections:    Talks on phone: Not on file    Gets together: Not on file    Attends religious service: Not on file    Active member of club or organization: Not on file    Attends meetings of clubs or organizations: Not on file    Relationship status: Not on file  . Intimate partner violence:    Fear of current or ex partner: Not on file    Emotionally abused: Not on file    Physically abused: Not on file    Forced sexual activity: Not on file  Other Topics Concern  . Not on file  Social History Narrative   Married,    One daughter.    1 granddaughter.   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      [phone] (c?)    Past Surgical History:  Procedure Laterality Date  . back microdiskectomy  10/01   [facility]  . BACK SURGERY    . CARDIOVASCULAR STRESS TEST  2009   [place]  . COLONOSCOPY  5/05   polyps int hemorrhoids, repeat 10 yrs  . ESOPHAGOGASTRODUODENOSCOPY  03/02/00   gastritis, chronic  . ESOPHAGOGASTRODUODENOSCOPY  5/05   polyps in antrum, neg H Pylori  . ETT  05/19/07   Adenosine Muoview  . H Pylori positive  8/98  . HERNIA REPAIR    . LAPAROSCOPIC INGUINAL HERNIA REPAIR  10/96  . levitin eval  02/05/99   hip pain  . LUMBAR SPINE SURGERY  5/00  . MRI  10/07/98   degen changes 9Dr. French Ana) steroid injections  . MYELOGRAM  9/01   stenosis L4-5    Family History  Problem Relation Age of Onset  . Prostate cancer Father   . Stroke Other        grandmother  . Lung cancer Sister   . Heart disease Brother     Allergies  Allergen Reactions  . Clarithromycin     REACTION: unspecified  . Codeine Phosphate     REACTION: nausea  . Nabumetone     REACTION: Muscle cramps    Current Outpatient Medications on File Prior to Visit  Medication Sig Dispense Refill  . aspirin 81 MG tablet Take 81 mg by mouth  daily.    . fluticasone (FLONASE) 50 MCG/ACT nasal spray PLACE 1 SPRAY INTO BOTH NOSTRILS 2 TIMES DAILY AS NEEDED FOR ALLERGIES OR RHINITIS. 48 g 1  . pantoprazole (PROTONIX) 40 MG tablet Take 1 tablet (40 mg total) by  mouth daily. For heartburn. 90 tablet 1  . simvastatin (ZOCOR) 40 MG tablet TAKE 1 TABLET BY MOUTH EVERY DAY IN THE EVENING 90 tablet 1   No current facility-administered medications on file prior to visit.     BP (!) 144/90   Pulse 60   Temp 98.2 F (36.8 C) (Tympanic)   Ht 5\' 9"  (1.753 m)   Wt 219 lb 8 oz (99.6 kg)   SpO2 98%   BMI 32.41 kg/m    Objective:   Physical Exam  Constitutional: He is oriented to person, place, and time. He appears well-nourished.  HENT:  Mouth/Throat: No oropharyngeal exudate.  Very hard of hearing with hearting aids  Eyes: Pupils are equal, round, and reactive to light. EOM are normal.  Neck: Neck supple. No thyromegaly present.  Cardiovascular: Normal rate and regular rhythm.  Respiratory: Effort normal and breath sounds normal.  GI: Soft. Bowel sounds are normal. There is no abdominal tenderness.  Musculoskeletal: Normal range of motion.  Neurological: He is alert and oriented to person, place, and time.  Skin: Skin is warm and dry.  Psychiatric: He has a normal mood and affect.           Assessment & Plan:

## 2018-10-12 NOTE — Patient Instructions (Addendum)
Complete xray(s) prior to leaving today. I will notify you of your results once received.  Stop taking metoprolol succinate 50 mg for blood pressure. This medication is lowering your heart rate too much.  Start taking amlodipine 5 mg once daily for blood pressure. I sent this to your pharmacy.  Start monitoring your blood pressure daily, around the same time of day, for the next 2-3 weeks.  Also measure your heart rate. Ensure that you have rested for 30 minutes prior to checking your blood pressure. Record your readings and schedule a virtual visit with me for two weeks.   It's important to improve your diet by reducing consumption of fast food, fried food, processed snack foods, sugary drinks. Increase consumption of fresh vegetables and fruits, whole grains, water.  Ensure you are drinking 64 ounces of water daily.  Notify Dr. Jeffie Pollock of your recent PSA level.  Schedule a virtual visit with me for 2 weeks for blood pressure check.  It was a pleasure to see you today!   Preventive Care 78 Years and Older, Male Preventive care refers to lifestyle choices and visits with your health care provider that can promote health and wellness. What does preventive care include?   A yearly physical exam. This is also called an annual well check.  Dental exams once or twice a year.  Routine eye exams. Ask your health care provider how often you should have your eyes checked.  Personal lifestyle choices, including: ? Daily care of your teeth and gums. ? Regular physical activity. ? Eating a healthy diet. ? Avoiding tobacco and drug use. ? Limiting alcohol use. ? Practicing safe sex. ? Taking low doses of aspirin every day. ? Taking vitamin and mineral supplements as recommended by your health care provider. What happens during an annual well check? The services and screenings done by your health care provider during your annual well check will depend on your age, overall health, lifestyle  risk factors, and family history of disease. Counseling Your health care provider may ask you questions about your:  Alcohol use.  Tobacco use.  Drug use.  Emotional well-being.  Home and relationship well-being.  Sexual activity.  Eating habits.  History of falls.  Memory and ability to understand (cognition).  Work and work Statistician. Screening You may have the following tests or measurements:  Height, weight, and BMI.  Blood pressure.  Lipid and cholesterol levels. These may be checked every 5 years, or more frequently if you are over 36 years old.  Skin check.  Lung cancer screening. You may have this screening every year starting at age 78 if you have a 30-pack-year history of smoking and currently smoke or have quit within the past 15 years.  Colorectal cancer screening. All adults should have this screening starting at age 78 and continuing until age 29. You will have tests every 1-10 years, depending on your results and the type of screening test. People at increased risk should start screening at an earlier age. Screening tests may include: ? Guaiac-based fecal occult blood testing. ? Fecal immunochemical test (FIT). ? Stool DNA test. ? Virtual colonoscopy. ? Sigmoidoscopy. During this test, a flexible tube with a tiny camera (sigmoidoscope) is used to examine your rectum and lower colon. The sigmoidoscope is inserted through your anus into your rectum and lower colon. ? Colonoscopy. During this test, a long, thin, flexible tube with a tiny camera (colonoscope) is used to examine your entire colon and rectum.  Prostate cancer screening.  Recommendations will vary depending on your family history and other risks.  Hepatitis C blood test.  Hepatitis B blood test.  Sexually transmitted disease (STD) testing.  Diabetes screening. This is done by checking your blood sugar (glucose) after you have not eaten for a while (fasting). You may have this done every  1-3 years.  Abdominal aortic aneurysm (AAA) screening. You may need this if you are a current or former smoker.  Osteoporosis. You may be screened starting at age 10 if you are at high risk. Talk with your health care provider about your test results, treatment options, and if necessary, the need for more tests. Vaccines Your health care provider may recommend certain vaccines, such as:  Influenza vaccine. This is recommended every year.  Tetanus, diphtheria, and acellular pertussis (Tdap, Td) vaccine. You may need a Td booster every 10 years.  Varicella vaccine. You may need this if you have not been vaccinated.  Zoster vaccine. You may need this after age 14.  Measles, mumps, and rubella (MMR) vaccine. You may need at least one dose of MMR if you were born in 1957 or later. You may also need a second dose.  Pneumococcal 13-valent conjugate (PCV13) vaccine. One dose is recommended after age 94.  Pneumococcal polysaccharide (PPSV23) vaccine. One dose is recommended after age 16.  Meningococcal vaccine. You may need this if you have certain conditions.  Hepatitis A vaccine. You may need this if you have certain conditions or if you travel or work in places where you may be exposed to hepatitis A.  Hepatitis B vaccine. You may need this if you have certain conditions or if you travel or work in places where you may be exposed to hepatitis B.  Haemophilus influenzae type b (Hib) vaccine. You may need this if you have certain risk factors. Talk to your health care provider about which screenings and vaccines you need and how often you need them. This information is not intended to replace advice given to you by your health care provider. Make sure you discuss any questions you have with your health care provider. Document Released: 05/30/2015 Document Revised: 06/23/2017 Document Reviewed: 03/04/2015 Elsevier Interactive Patient Education  2019 [employer].

## 2018-10-12 NOTE — Assessment & Plan Note (Signed)
Improved on pantoprazole 40 mg daily. Continue same.

## 2018-10-12 NOTE — Assessment & Plan Note (Signed)
Repeat lipids stable.  Continue Simvastatin.

## 2018-10-12 NOTE — Progress Notes (Signed)
I reviewed health advisor's note, was available for consultation, and agree with documentation and plan.  We contacted the patient shortly after his visit with Ms. Barnetta Hammersmith and he denied shortness of breath and fatigue. He was evaluated and treated in the office on 10/12/18 by myself.

## 2018-10-12 NOTE — Assessment & Plan Note (Signed)
Profound, even with hearing aids.

## 2018-10-12 NOTE — Assessment & Plan Note (Signed)
Slightly above goal today, improved at home. Also HR running too low at home. It's not clear as to why he's even managed on metoprolol succinate so we will discontinue given his age and bradycardia.  Start amlodipine 5 mg daily. We will have him monitor BP and HR and we will see him virtually in 2 weeks.

## 2018-10-20 ENCOUNTER — Other Ambulatory Visit: Payer: Self-pay | Admitting: Primary Care

## 2018-10-20 DIAGNOSIS — K219 Gastro-esophageal reflux disease without esophagitis: Secondary | ICD-10-CM

## 2018-10-25 ENCOUNTER — Ambulatory Visit (INDEPENDENT_AMBULATORY_CARE_PROVIDER_SITE_OTHER): Payer: Medicare Other | Admitting: Primary Care

## 2018-10-25 ENCOUNTER — Encounter: Payer: Self-pay | Admitting: Primary Care

## 2018-10-25 DIAGNOSIS — I10 Essential (primary) hypertension: Secondary | ICD-10-CM | POA: Diagnosis not present

## 2018-10-25 MED ORDER — AMLODIPINE BESYLATE 5 MG PO TABS: 5.0000 mg | ORAL_TABLET | Freq: Every day | ORAL | 0 refills | Status: DC

## 2018-10-25 NOTE — Progress Notes (Signed)
Subjective:    Patient ID: Chad Huang, male    DOB: [birth year], 78 y.o.   MRN: [id number]  HPI  Virtual Visit via Video Note  I connected with Chad Huang on 10/25/18 at  3:00 PM EDT by a video enabled telemedicine application and verified that I am speaking with the correct person using two identifiers.  Location: Patient: Home Provider: Office   I discussed the limitations of evaluation and management by telemedicine and the availability of in person appointments. The patient expressed understanding and agreed to proceed.  History of Present Illness:  Chad Huang is a 78 year old male who presents today for follow up of hypertension.  He was last evaluated on 10/12/18 for his annual exam, BP was noted to be above goal in the office with better home readings. Also with low heart rate at home and in the office. Given his bradycardia his metoprolol succinate was discontinued and he was initiated on amlodipine 5 mg.  Since his last visit he's been checking his BP which is running 120's-130's/70's with heart rate in the mid 60's-mid 70's.   Today he checked his BP which was 158/86. This is the first time that his BP has been elevated. He denies dizziness, chest pain, shortness of breath.    Observations/Objective:  Alert and oriented. Appears well, not sickly. No distress. Speaking in complete sentences.   Assessment and Plan:  Normal home BP readings except for today. HR is much improved off of metoprolol succinate. Discussed to continue to monitor home BP readings and report readings that are consistently at or above 140/90. Repeat BMP pending.  Follow Up Instructions:  Continue Amlodipine 5 mg daily for blood pressure.  Please notify me if you continue to see readings at or above 140/90.  It was a pleasure to see you today! Allie Bossier, NP-C    I discussed the assessment and treatment plan with the patient. The patient was provided an opportunity to ask questions  and all were answered. The patient agreed with the plan and demonstrated an understanding of the instructions.   The patient was advised to call back or seek an in-person evaluation if the symptoms worsen or if the condition fails to improve as anticipated.     Chad Koch, NP    Review of Systems  Respiratory: Negative for shortness of breath.   Cardiovascular: Negative for chest pain.  Neurological: Negative for dizziness and headaches.       Past Medical History:  Diagnosis Date  . Alcohol abuse   . Anxiety and depression    charter, admission  . Back pain    ([place]ritzer), microdiskectomy-great result  . Benign prostatic hypertrophy    per Dr Jeffie Pollock with uro  . Cataracts, bilateral   . Cerumen impaction    left  . Chest pain   . GERD (gastroesophageal reflux disease)   . Hearing loss   . Hiatal hernia    nodule  . HTN (hypertension)   . Hyperlipidemia, mixed   . Inguinal hernia    left, hx of, repair  . Internal hemorrhoids   . Left ear pain   . Muscle disorder    disorder, muscle/ligaminet nis, rt lower extremity  . Prostate cancer [facility]) 2010   Dr Jeffie Pollock  . Skin cancer    hx, left ear     Social History   Socioeconomic History  . Marital status: Widowed    Spouse name: Not on file  .  Number of children: Not on file  . Years of education: Not on file  . Highest education level: Not on file  Occupational History  . Not on file  Social Needs  . Financial resource strain: Not on file  . Food insecurity:    Worry: Not on file    Inability: Not on file  . Transportation needs:    Medical: Not on file    Non-medical: Not on file  Tobacco Use  . Smoking status: Former Smoker    Packs/day: 1.00    Years: 20.00    Pack years: 20.00    Types: Cigarettes  . Smokeless tobacco: Current User    Types: Chew  . Tobacco comment: quit in 1973  Substance and Sexual Activity  . Alcohol use: No    Comment: none since 1996  . Drug use: No  . Sexual  activity: Not Currently  Lifestyle  . Physical activity:    Days per week: Not on file    Minutes per session: Not on file  . Stress: Not on file  Relationships  . Social connections:    Talks on phone: Not on file    Gets together: Not on file    Attends religious service: Not on file    Active member of club or organization: Not on file    Attends meetings of clubs or organizations: Not on file    Relationship status: Not on file  . Intimate partner violence:    Fear of current or ex partner: Not on file    Emotionally abused: Not on file    Physically abused: Not on file    Forced sexual activity: Not on file  Other Topics Concern  . Not on file  Social History Narrative   Married,    One daughter.    1 granddaughter.   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      [phone] (c?)    Past Surgical History:  Procedure Laterality Date  . back microdiskectomy  10/01   [place]  . BACK SURGERY    . CARDIOVASCULAR STRESS TEST  2009   [place]  . COLONOSCOPY  5/05   polyps int hemorrhoids, repeat 10 yrs  . ESOPHAGOGASTRODUODENOSCOPY  03/02/00   gastritis, chronic  . ESOPHAGOGASTRODUODENOSCOPY  5/05   polyps in antrum, neg H Pylori  . ETT  05/19/07   Adenosine Muoview  . H Pylori positive  8/98  . HERNIA REPAIR    . LAPAROSCOPIC INGUINAL HERNIA REPAIR  10/96  . levitin eval  02/05/99   hip pain  . LUMBAR SPINE SURGERY  5/00  . MRI  10/07/98   degen changes 9Dr. French Ana) steroid injections  . MYELOGRAM  9/01   stenosis L4-5    Family History  Problem Relation Age of Onset  . Prostate cancer Father   . Stroke Other        grandmother  . Lung cancer Sister   . Heart disease Brother     Allergies  Allergen Reactions  . Clarithromycin     REACTION: unspecified  . Codeine Phosphate     REACTION: nausea  . Nabumetone     REACTION: Muscle cramps    Current Outpatient Medications on File Prior to Visit  Medication Sig Dispense Refill  . aspirin 81 MG  tablet Take 81 mg by mouth daily.    . fluticasone (FLONASE) 50 MCG/ACT nasal spray PLACE 1 SPRAY INTO BOTH NOSTRILS 2 TIMES DAILY AS NEEDED  FOR ALLERGIES OR RHINITIS. 48 g 1  . pantoprazole (PROTONIX) 40 MG tablet Take 1 tablet (40 mg total) by mouth daily. For heartburn. 90 tablet 1  . simvastatin (ZOCOR) 40 MG tablet TAKE 1 TABLET BY MOUTH EVERY DAY IN THE EVENING 90 tablet 1   No current facility-administered medications on file prior to visit.     There were no vitals taken for this visit.   Objective:   Physical Exam  Constitutional: He is oriented to person, place, and time. He appears well-nourished.  Respiratory: Effort normal.  Neurological: He is alert and oriented to person, place, and time.  Psychiatric: He has a normal mood and affect.           Assessment & Plan:

## 2018-10-25 NOTE — Patient Instructions (Signed)
Continue Amlodipine 5 mg daily for blood pressure.  Please notify me if you continue to see readings at or above 140/90.  It was a pleasure to see you today! Allie Bossier, NP-C

## 2018-10-25 NOTE — Assessment & Plan Note (Signed)
Normal home BP readings except for today. HR is much improved off of metoprolol succinate. Discussed to continue to monitor home BP readings and report readings that are consistently at or above 140/90. Repeat BMP pending. Will have his BP checked during his upcoming lab appointment.

## 2018-10-31 ENCOUNTER — Ambulatory Visit: Payer: Medicare Other

## 2018-10-31 ENCOUNTER — Other Ambulatory Visit: Payer: Self-pay

## 2018-10-31 ENCOUNTER — Other Ambulatory Visit (INDEPENDENT_AMBULATORY_CARE_PROVIDER_SITE_OTHER): Payer: Medicare Other

## 2018-10-31 DIAGNOSIS — N289 Disorder of kidney and ureter, unspecified: Secondary | ICD-10-CM | POA: Diagnosis not present

## 2018-10-31 LAB — BASIC METABOLIC PANEL
BUN: 17 mg/dL (ref 6–23)
CO2: 28 mEq/L (ref 19–32)
Calcium: 8.9 mg/dL (ref 8.4–10.5)
Chloride: 105 mEq/L (ref 96–112)
Creatinine, Ser: 1.19 mg/dL (ref 0.40–1.50)
GFR: 59.18 mL/min — ABNORMAL LOW (ref >60.00)
Glucose, Bld: 108 mg/dL — ABNORMAL HIGH (ref 70–99)
Potassium: 4.5 mEq/L (ref 3.5–5.1)
Sodium: 139 mEq/L (ref 135–145)

## 2018-11-02 ENCOUNTER — Ambulatory Visit: Payer: Medicare Other

## 2018-11-03 ENCOUNTER — Other Ambulatory Visit: Payer: Self-pay | Admitting: Primary Care

## 2018-11-03 DIAGNOSIS — I10 Essential (primary) hypertension: Secondary | ICD-10-CM

## 2018-11-08 ENCOUNTER — Other Ambulatory Visit: Payer: Self-pay | Admitting: Primary Care

## 2019-01-07 ENCOUNTER — Other Ambulatory Visit: Payer: Self-pay | Admitting: Primary Care

## 2019-01-07 DIAGNOSIS — E785 Hyperlipidemia, unspecified: Secondary | ICD-10-CM

## 2019-01-23 ENCOUNTER — Other Ambulatory Visit: Payer: Self-pay | Admitting: Primary Care

## 2019-01-23 DIAGNOSIS — E785 Hyperlipidemia, unspecified: Secondary | ICD-10-CM

## 2019-01-23 DIAGNOSIS — K219 Gastro-esophageal reflux disease without esophagitis: Secondary | ICD-10-CM

## 2019-01-23 DIAGNOSIS — I10 Essential (primary) hypertension: Secondary | ICD-10-CM

## 2019-01-23 MED ORDER — AMLODIPINE BESYLATE 5 MG PO TABS: 5.0000 mg | ORAL_TABLET | Freq: Every day | ORAL | 1 refills | Status: DC

## 2019-01-23 MED ORDER — FLUTICASONE PROPIONATE 50 MCG/ACT NA SUSP: NASAL | 1 refills | Status: DC

## 2019-01-23 MED ORDER — SIMVASTATIN 40 MG PO TABS: ORAL_TABLET | ORAL | 1 refills | Status: DC

## 2019-01-23 MED ORDER — PANTOPRAZOLE SODIUM 40 MG PO TBEC: 40.0000 mg | DELAYED_RELEASE_TABLET | Freq: Every day | ORAL | 1 refills | Status: DC

## 2019-02-13 ENCOUNTER — Other Ambulatory Visit: Payer: Self-pay | Admitting: Primary Care

## 2019-02-13 DIAGNOSIS — I10 Essential (primary) hypertension: Secondary | ICD-10-CM

## 2019-02-22 ENCOUNTER — Other Ambulatory Visit: Payer: Self-pay | Admitting: Primary Care

## 2019-03-14 ENCOUNTER — Other Ambulatory Visit: Payer: Self-pay | Admitting: Primary Care

## 2019-03-14 DIAGNOSIS — K219 Gastro-esophageal reflux disease without esophagitis: Secondary | ICD-10-CM

## 2019-06-05 ENCOUNTER — Encounter: Payer: Self-pay | Admitting: Family Medicine

## 2019-06-05 ENCOUNTER — Ambulatory Visit (INDEPENDENT_AMBULATORY_CARE_PROVIDER_SITE_OTHER): Payer: Medicare Other | Admitting: Family Medicine

## 2019-06-05 DIAGNOSIS — R0981 Nasal congestion: Secondary | ICD-10-CM | POA: Diagnosis not present

## 2019-06-05 NOTE — Assessment & Plan Note (Signed)
Moderate to severe nasal congestion with scant mucous production  Differential incl viral or allergic cause (working in dust/must) and afrin rebound inst to get covid tested-info given/ then isolate and call us with result  Stop afrin Increase flonase to bid Stay out of dust for a week (wear a mask upon returning)  Alert [place] if sinus pain or other new symptoms or if no improvement in a week

## 2019-06-05 NOTE — Progress Notes (Signed)
Virtual Visit via Video Note  I connected with Chad Huang on 06/05/19 at  2:00 PM EST by a video enabled telemedicine application and verified that I am speaking with the correct person using two identifiers.  Location: Patient: home Provider: office    I discussed the limitations of evaluation and management by telemedicine and the availability of in person appointments. The patient expressed understanding and agreed to proceed.  Parties involved in encounter  Patient: Chad Huang  Provider:  Loura Pardon MD    History of Present Illness: Pt presents with nasal symptoms for a week   He had nasal pain and stuffiness  No pnd or throat pain  No fever or cough  Sense of taste and smell are fine  Feels fine   Gets very little nasal d/c out  White to yellow  No sinus pain  No sinus pressure  Just nasal pain way up inside   No wheezing or cough   No known exp to covid  He sits far away from people in church - sometimes masked  Everyone at his house feels fine    Usually has fall allergies  He uses flonase every day  He has been working with musty things /possible mold exposure   occ saline spray  Uses afrin ns over the counter  Has used for 2 days   Uses it 3 times daily -it helps a little bit    Has taken 3 tylenol today   He cannot take ibuprofen   Patient Active Problem List   Diagnosis Date Noted  . Nasal congestion 06/05/2019  . Chest discomfort 10/12/2018  . Preventative health care 09/26/2017  . Prostate cancer ([place]) 11/02/2010  . Hyperlipidemia 10/25/2008  . Essential hypertension 10/25/2008  . Hearing loss 03/13/2007  . Internal hemorrhoids 03/07/2007  . GERD 03/07/2007   Past Medical History:  Diagnosis Date  . Alcohol abuse   . Anxiety and depression    charter, admission  . Back pain    ([place]), microdiskectomy-great result  . Benign prostatic hypertrophy    per Dr Jeffie Pollock with uro  . Cataracts, bilateral   . Cerumen impaction    left   . Chest pain   . GERD (gastroesophageal reflux disease)   . Hearing loss   . Hiatal hernia    nodule  . HTN (hypertension)   . Hyperlipidemia, mixed   . Inguinal hernia    left, hx of, repair  . Internal hemorrhoids   . Left ear pain   . Muscle disorder    disorder, muscle/ligaminet nis, rt lower extremity  . Prostate cancer [facility]) 2010   Dr Jeffie Pollock  . Skin cancer    hx, left ear   Past Surgical History:  Procedure Laterality Date  . back microdiskectomy  10/01   [place]  . BACK SURGERY    . CARDIOVASCULAR STRESS TEST  2009   [place]  . COLONOSCOPY  5/05   polyps int hemorrhoids, repeat 10 yrs  . ESOPHAGOGASTRODUODENOSCOPY  03/02/00   gastritis, chronic  . ESOPHAGOGASTRODUODENOSCOPY  5/05   polyps in antrum, neg H Pylori  . ETT  05/19/07   Adenosine Muoview  . H Pylori positive  8/98  . HERNIA REPAIR    . LAPAROSCOPIC INGUINAL HERNIA REPAIR  10/96  . levitin eval  02/05/99   hip pain  . LUMBAR SPINE SURGERY  5/00  . MRI  10/07/98   degen changes 9Dr. French Ana) steroid injections  . MYELOGRAM  9/01  stenosis L4-5   Social History   Tobacco Use  . Smoking status: Former Smoker    Packs/day: 1.00    Years: 20.00    Pack years: 20.00    Types: Cigarettes  . Smokeless tobacco: Current User    Types: Chew  . Tobacco comment: quit in 1973  Substance Use Topics  . Alcohol use: No    Comment: none since 1996  . Drug use: No   Family History  Problem Relation Age of Onset  . Prostate cancer Father   . Stroke Other        grandmother  . Lung cancer Sister   . Heart disease Brother    Allergies  Allergen Reactions  . Clarithromycin     REACTION: unspecified  . Codeine Phosphate     REACTION: nausea  . Nabumetone     REACTION: Muscle cramps   Current Outpatient Medications on File Prior to Visit  Medication Sig Dispense Refill  . amLODipine (NORVASC) 5 MG tablet TAKE 1 TABLET BY MOUTH EVERY DAY FOR BLOOD PRESSURE 90 tablet 1  . aspirin 81 MG tablet Take  81 mg by mouth daily.    . fluticasone (FLONASE) 50 MCG/ACT nasal spray PLACE 1 SPRAY INTO BOTH NOSTRILS 2 TIMES DAILY AS NEEDED FOR ALLERGIES OR RHINITIS. 48 mL 1  . pantoprazole (PROTONIX) 40 MG tablet Take 1 tablet (40 mg total) by mouth daily. For heartburn. 90 tablet 1  . simvastatin (ZOCOR) 40 MG tablet TAKE 1 TABLET BY MOUTH EVERY DAY IN THE EVENING 90 tablet 1  . tamsulosin (FLOMAX) 0.4 MG CAPS capsule Take 0.4 mg by mouth daily.     No current facility-administered medications on file prior to visit.   Review of Systems  Constitutional: Negative for chills, fever and malaise/fatigue.  HENT: Positive for congestion. Negative for ear pain, nosebleeds, sinus pain and sore throat.   Eyes: Negative for blurred vision, discharge and redness.  Respiratory: Negative for cough, shortness of breath and stridor.   Cardiovascular: Negative for chest pain, palpitations and leg swelling.  Gastrointestinal: Negative for abdominal pain, diarrhea, nausea and vomiting.  Musculoskeletal: Negative for myalgias.  Skin: Negative for rash.  Neurological: Negative for dizziness and headaches.      Observations/Objective: Patient appears well, in no distress Weight is baseline  No facial swelling or asymmetry Normal voice-not hoarse and no slurred speech (does sound nasally congested)  No obvious tremor or mobility impairment Moving neck and UEs normally Able to hear the call well  No cough or shortness of breath during interview  Talkative and mentally sharp with no cognitive changes No skin changes on face or neck , no rash or pallor Affect is normal    Assessment and Plan: Problem List Items Addressed This Visit      Other   Nasal congestion    Moderate to severe nasal congestion with scant mucous production  Differential incl viral or allergic cause (working in dust/must) and afrin rebound inst to get covid tested-info given/ then isolate and call us with result  Stop afrin Increase  flonase to bid Stay out of dust for a week (wear a mask upon returning)  Alert [place] if sinus pain or other new symptoms or if no improvement in a week            Follow Up Instructions: Please go get tested for covid with the contact info I gave you  Let us know when results return  Isolate yourself until then  Stop afrin-it can make symptoms worse  Increase your flonase to 2 sprays in each nostril twice daily for a week  Nasal saline is fine to use Get out of the dust for a week (when you return to that work always wear a mask)  Drink lots of fluids Call if symptoms worsen  Call if you develop sinus pain or any new symptoms    I discussed the assessment and treatment plan with the patient. The patient was provided an opportunity to ask questions and all were answered. The patient agreed with the plan and demonstrated an understanding of the instructions.   The patient was advised to call back or seek an in-person evaluation if the symptoms worsen or if the condition fails to improve as anticipated.  Loura Pardon, MD

## 2019-06-05 NOTE — Patient Instructions (Signed)
Please go get tested for covid with the contact info I gave you  Let us know when results return  Isolate yourself until then   Stop afrin-it can make symptoms worse  Increase your flonase to 2 sprays in each nostril twice daily for a week  Nasal saline is fine to use Get out of the dust for a week (when you return to that work always wear a mask)  Drink lots of fluids Call if symptoms worsen  Call if you develop sinus pain or any new symptoms

## 2019-06-12 ENCOUNTER — Ambulatory Visit (INDEPENDENT_AMBULATORY_CARE_PROVIDER_SITE_OTHER): Payer: Medicare Other | Admitting: Otolaryngology

## 2019-06-12 ENCOUNTER — Encounter (INDEPENDENT_AMBULATORY_CARE_PROVIDER_SITE_OTHER): Payer: Self-pay | Admitting: Otolaryngology

## 2019-06-12 ENCOUNTER — Other Ambulatory Visit: Payer: Self-pay

## 2019-06-12 VITALS — Temp 98.2°F

## 2019-06-12 DIAGNOSIS — J3489 Other specified disorders of nose and nasal sinuses: Secondary | ICD-10-CM | POA: Diagnosis not present

## 2019-06-12 DIAGNOSIS — J310 Chronic rhinitis: Secondary | ICD-10-CM

## 2019-06-12 NOTE — Progress Notes (Signed)
HPI: Chad Huang is a 79 y.o. male who presents is referred by Dr. Glori Bickers for evaluation of nasal sinus symptoms.  Over the past several weeks patient has complained of soreness in his nose and some congestion in the nose.  He has not lost his sense of smell or taste.  He states that he might have been exposed to fungus.  Denies any yellow-green discharge from the nose.  He has had no previous surgery on the nose. He has had no fever..  Past Medical History:  Diagnosis Date  . Alcohol abuse   . Anxiety and depression    charter, admission  . Back pain    ([place]), microdiskectomy-great result  . Benign prostatic hypertrophy    per Dr Jeffie Pollock with uro  . Cataracts, bilateral   . Cerumen impaction    left  . Chest pain   . GERD (gastroesophageal reflux disease)   . Hearing loss   . Hiatal hernia    nodule  . HTN (hypertension)   . Hyperlipidemia, mixed   . Inguinal hernia    left, hx of, repair  . Internal hemorrhoids   . Left ear pain   . Muscle disorder    disorder, muscle/ligaminet nis, rt lower extremity  . Prostate cancer [facility]) 2010   Dr Jeffie Pollock  . Skin cancer    hx, left ear   Past Surgical History:  Procedure Laterality Date  . back microdiskectomy  10/01   [place]  . BACK SURGERY    . CARDIOVASCULAR STRESS TEST  2009   [place]  . COLONOSCOPY  5/05   polyps int hemorrhoids, repeat 10 yrs  . ESOPHAGOGASTRODUODENOSCOPY  03/02/00   gastritis, chronic  . ESOPHAGOGASTRODUODENOSCOPY  5/05   polyps in antrum, neg H Pylori  . ETT  05/19/07   Adenosine Muoview  . H Pylori positive  8/98  . HERNIA REPAIR    . LAPAROSCOPIC INGUINAL HERNIA REPAIR  10/96  . levitin eval  02/05/99   hip pain  . LUMBAR SPINE SURGERY  5/00  . MRI  10/07/98   degen changes 9Dr. French Ana) steroid injections  . MYELOGRAM  9/01   stenosis L4-5   Social History   Socioeconomic History  . Marital status: Widowed    Spouse name: Not on file  . Number of children: Not on file  . Years of  education: Not on file  . Highest education level: Not on file  Occupational History  . Not on file  Tobacco Use  . Smoking status: Former Smoker    Packs/day: 1.00    Years: 20.00    Pack years: 20.00    Types: Cigarettes  . Smokeless tobacco: Current User    Types: Chew  . Tobacco comment: quit in 1973  Substance and Sexual Activity  . Alcohol use: No    Comment: none since 1996  . Drug use: No  . Sexual activity: Not Currently  Other Topics Concern  . Not on file  Social History Narrative   Married,    One daughter.    1 granddaughter.   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      [phone] (c?)   Social Determinants of Health   Financial Resource Strain:   . Difficulty of Paying Living Expenses: Not on file  Food Insecurity:   . Worried About Charity fundraiser in the Last Year: Not on file  . Ran Out of Food in the Last Year: Not on  file  Transportation Needs:   . Film/video editor (Medical): Not on file  . Lack of Transportation (Non-Medical): Not on file  Physical Activity:   . Days of Exercise per Week: Not on file  . Minutes of Exercise per Session: Not on file  Stress:   . Feeling of Stress : Not on file  Social Connections:   . Frequency of Communication with Friends and Family: Not on file  . Frequency of Social Gatherings with Friends and Family: Not on file  . Attends Religious Services: Not on file  . Active Member of Clubs or Organizations: Not on file  . Attends Archivist Meetings: Not on file  . Marital Status: Not on file   Family History  Problem Relation Age of Onset  . Prostate cancer Father   . Stroke Other        grandmother  . Lung cancer Sister   . Heart disease Brother    Allergies  Allergen Reactions  . Clarithromycin     REACTION: unspecified  . Codeine Phosphate     REACTION: nausea  . Nabumetone     REACTION: Muscle cramps   Prior to Admission medications   Medication Sig Start Date End Date  Taking? Authorizing Provider  amLODipine (NORVASC) 5 MG tablet TAKE 1 TABLET BY MOUTH EVERY DAY FOR BLOOD PRESSURE 02/13/19   [name], NP  aspirin 81 MG tablet Take 81 mg by mouth daily.    [provider]  fluticasone (FLONASE) 50 MCG/ACT nasal spray PLACE 1 SPRAY INTO BOTH NOSTRILS 2 TIMES DAILY AS NEEDED FOR ALLERGIES OR RHINITIS. 02/22/19   [name], NP  pantoprazole (PROTONIX) 40 MG tablet Take 1 tablet (40 mg total) by mouth daily. For heartburn. 01/23/19   [name], NP  simvastatin (ZOCOR) 40 MG tablet TAKE 1 TABLET BY MOUTH EVERY DAY IN THE EVENING 01/23/19   [name], NP  tamsulosin (FLOMAX) 0.4 MG CAPS capsule Take 0.4 mg by mouth daily. 05/29/19   [provider]     Positive ROS: Otherwise negative  All other systems have been reviewed and were otherwise negative with the exception of those mentioned in the HPI and as above.  Physical Exam: Constitutional: Alert, well-appearing, no acute distress Ears: External ears without lesions or tenderness. Ear canals are clear bilaterally with intact, clear TMs.  Nasal: External nose without lesions. Septum is moderately deviated to the right side.  No obvious sores in the nose anteriorly although he states that the right nostril is sore when performing anterior rhinoscopy.  Nasal endoscopy was performed on both sides.  Both middle meatus regions were clear with no evidence of active infection.  No polyps noted nasopharynx was clear. Oral: Lips and gums without lesions. Tongue and palate mucosa without lesions. Posterior oropharynx clear. Neck: No palpable adenopathy or masses Respiratory: Breathing comfortably  Skin: No facial/neck lesions or rash noted.  Nasal/sinus endoscopy  Date/Time: 06/12/2019 1:40 PM Performed by: Rozetta Nunnery, MD Authorized by: Rozetta Nunnery, MD   Consent:    Consent obtained:  Verbal   Consent given by:  Patient   Risks discussed:   Pain Procedure details:    Indications: sino-nasal symptoms     Medication:  Afrin   Scope location: bilateral nare   Septum:    Deviation: deviated to the left     Severity of deviation: intermediate   Sinus:    Right middle meatus: normal  Left middle meatus: normal     Right nasopharynx: normal     Left nasopharynx: normal   Comments:     Under nasal endoscopy both middle meatus regions were clear with no evidence of active infection.  He had moderate rhinitis with moderate mucosal swelling but no mucopurulent discharge.  No polyps noted.  Nasopharynx was clear bilaterally    Assessment: Septal deviation to the right.  Chronic rhinitis.  No clinical signs of active sinus infection. Mild vestibulitis  Plan: He apparently was using Flonase but stopped using this because of friends who developed headaches using Flonase. Switched him to Nasacort 2 sprays each nostril at night. Also recommended using mupirocin 2% ointment and applying it to any sores in the nostrils.   Radene Journey, MD   CC:

## 2019-07-06 ENCOUNTER — Other Ambulatory Visit: Payer: Self-pay | Admitting: Urology

## 2019-07-06 DIAGNOSIS — Z77018 Contact with and (suspected) exposure to other hazardous metals: Secondary | ICD-10-CM

## 2019-07-06 DIAGNOSIS — C61 Malignant neoplasm of prostate: Secondary | ICD-10-CM

## 2019-07-15 ENCOUNTER — Other Ambulatory Visit: Payer: Self-pay | Admitting: Primary Care

## 2019-07-15 DIAGNOSIS — K219 Gastro-esophageal reflux disease without esophagitis: Secondary | ICD-10-CM

## 2019-07-15 DIAGNOSIS — I10 Essential (primary) hypertension: Secondary | ICD-10-CM

## 2019-07-30 ENCOUNTER — Ambulatory Visit
Admission: RE | Admit: 2019-07-30 | Discharge: 2019-07-30 | Disposition: A | Discharge status: Home / Self Care | Payer: Medicare Other | Source: Ambulatory Visit | Attending: Urology | Admitting: Urology

## 2019-07-30 ENCOUNTER — Other Ambulatory Visit: Payer: Self-pay

## 2019-07-30 DIAGNOSIS — Z77018 Contact with and (suspected) exposure to other hazardous metals: Secondary | ICD-10-CM

## 2019-07-30 DIAGNOSIS — C61 Malignant neoplasm of prostate: Secondary | ICD-10-CM

## 2019-07-30 MED ORDER — GADOBENATE DIMEGLUMINE 529 MG/ML IV SOLN
20.0000 mL | Freq: Once | INTRAVENOUS | Status: AC | PRN
  Administered 2019-07-30: 20 mL via INTRAVENOUS

## 2019-09-13 ENCOUNTER — Telehealth: Payer: Self-pay | Admitting: Primary Care

## 2019-09-13 NOTE — Telephone Encounter (Signed)
Place form in Kate Clark's inbox 

## 2019-09-13 NOTE — Telephone Encounter (Signed)
Pt dropped off parking placard form to be filled out.Placed in RX tower °

## 2019-09-13 NOTE — Telephone Encounter (Signed)
I need the reason for why he is needing the placard, thanks. Also due for CPE in May 2021.

## 2019-09-14 NOTE — Telephone Encounter (Signed)
Spoken to patient's daughter. She stated that patient cannot walk long due to his back which he did had surgery for but still hard to walk at times.  Patient already has his AWV and CPE shceduled in May/June.

## 2019-09-14 NOTE — Telephone Encounter (Signed)
Spoken to patient and he will pick up today. Left in the front office

## 2019-09-14 NOTE — Telephone Encounter (Signed)
Completed and placed in Chans inbox. 

## 2019-09-26 ENCOUNTER — Other Ambulatory Visit: Payer: Self-pay | Admitting: Primary Care

## 2019-09-26 DIAGNOSIS — C61 Malignant neoplasm of prostate: Secondary | ICD-10-CM

## 2019-09-26 DIAGNOSIS — E782 Mixed hyperlipidemia: Secondary | ICD-10-CM

## 2019-09-26 DIAGNOSIS — I10 Essential (primary) hypertension: Secondary | ICD-10-CM

## 2019-10-12 ENCOUNTER — Ambulatory Visit (INDEPENDENT_AMBULATORY_CARE_PROVIDER_SITE_OTHER): Payer: Medicare Other

## 2019-10-12 ENCOUNTER — Other Ambulatory Visit (INDEPENDENT_AMBULATORY_CARE_PROVIDER_SITE_OTHER): Payer: Medicare Other

## 2019-10-12 ENCOUNTER — Other Ambulatory Visit: Payer: Self-pay

## 2019-10-12 DIAGNOSIS — E782 Mixed hyperlipidemia: Secondary | ICD-10-CM

## 2019-10-12 DIAGNOSIS — C61 Malignant neoplasm of prostate: Secondary | ICD-10-CM

## 2019-10-12 DIAGNOSIS — Z0000 Encounter for general adult medical examination without abnormal findings: Secondary | ICD-10-CM

## 2019-10-12 DIAGNOSIS — I10 Essential (primary) hypertension: Secondary | ICD-10-CM | POA: Diagnosis not present

## 2019-10-12 LAB — COMPREHENSIVE METABOLIC PANEL
ALT: 10 U/L (ref 0–53)
AST: 14 U/L (ref 0–37)
Albumin: 4.4 g/dL (ref 3.5–5.2)
Alkaline Phosphatase: 70 U/L (ref 39–117)
BUN: 14 mg/dL (ref 6–23)
CO2: 28 mEq/L (ref 19–32)
Calcium: 9.1 mg/dL (ref 8.4–10.5)
Chloride: 104 mEq/L (ref 96–112)
Creatinine, Ser: 1.21 mg/dL (ref 0.40–1.50)
GFR: 57.91 mL/min — ABNORMAL LOW (ref >60.00)
Glucose, Bld: 94 mg/dL (ref 70–99)
Potassium: 4.2 mEq/L (ref 3.5–5.1)
Sodium: 140 mEq/L (ref 135–145)
Total Bilirubin: 0.7 mg/dL (ref 0.2–1.2)
Total Protein: 6.2 g/dL (ref 6.0–8.3)

## 2019-10-12 LAB — CBC
HCT: 43.3 % (ref 39.0–52.0)
Hemoglobin: 14.8 g/dL (ref 13.0–17.0)
MCHC: 34.2 g/dL (ref 30.0–36.0)
MCV: 87.1 fl (ref 78.0–100.0)
Platelets: 163 10*3/uL (ref 150.0–400.0)
RBC: 4.97 Mil/uL (ref 4.22–5.81)
RDW: 13.7 % (ref 11.5–15.5)
WBC: 6.8 10*3/uL (ref 4.0–10.5)

## 2019-10-12 LAB — LIPID PANEL
Cholesterol: 104 mg/dL (ref 0–200)
HDL: 33.6 mg/dL — ABNORMAL LOW (ref >39.00)
LDL Cholesterol: 46 mg/dL (ref 0–99)
NonHDL: 70.68
Total CHOL/HDL Ratio: 3
Triglycerides: 124 mg/dL (ref 0.0–149.0)
VLDL: 24.8 mg/dL (ref 0.0–40.0)

## 2019-10-12 LAB — PSA, MEDICARE: PSA: 7.16 ng/ml — ABNORMAL HIGH (ref 0.10–4.00)

## 2019-10-12 NOTE — Progress Notes (Signed)
PCP notes:  Health Maintenance: Tdap- insurance/financial   Abnormal Screenings: none   Patient concerns: Ongoing nose pain    Nurse concerns: none    Next PCP appt.: 10/16/2019 @ 10:20 am

## 2019-10-12 NOTE — Patient Instructions (Signed)
Chad Huang , Thank you for taking time to come for your Medicare Wellness Visit. I appreciate your ongoing commitment to your health goals. Please review the following plan we discussed and let me know if I can assist you in the future.   Screening recommendations/referrals: Colonoscopy: no longer required Recommended yearly ophthalmology/optometry visit for glaucoma screening and checkup Recommended yearly dental visit for hygiene and checkup  Vaccinations: Influenza vaccine: Up to date, completed 05/31/2016 Pneumococcal vaccine: Completed series Tdap vaccine: decline Shingles vaccine: discussed    Advanced directives: copy in chart  Conditions/risks identified: hypertension, hyperlipidemia  Next appointment: 10/16/2019 @ 10:20 am   Preventive Care 79 Years and Older, Male Preventive care refers to lifestyle choices and visits with your health care provider that can promote health and wellness. What does preventive care include?  A yearly physical exam. This is also called an annual well check.  Dental exams once or twice a year.  Routine eye exams. Ask your health care provider how often you should have your eyes checked.  Personal lifestyle choices, including:  Daily care of your teeth and gums.  Regular physical activity.  Eating a healthy diet.  Avoiding tobacco and drug use.  Limiting alcohol use.  Practicing safe sex.  Taking low doses of aspirin every day.  Taking vitamin and mineral supplements as recommended by your health care provider. What happens during an annual well check? The services and screenings done by your health care provider during your annual well check will depend on your age, overall health, lifestyle risk factors, and family history of disease. Counseling  Your health care provider may ask you questions about your:  Alcohol use.  Tobacco use.  Drug use.  Emotional well-being.  Home and relationship well-being.  Sexual activity.   Eating habits.  History of falls.  Memory and ability to understand (cognition).  Work and work Statistician. Screening  You may have the following tests or measurements:  Height, weight, and BMI.  Blood pressure.  Lipid and cholesterol levels. These may be checked every 5 years, or more frequently if you are over 6 years old.  Skin check.  Lung cancer screening. You may have this screening every year starting at age 48 if you have a 30-pack-year history of smoking and currently smoke or have quit within the past 15 years.  Fecal occult blood test (FOBT) of the stool. You may have this test every year starting at age 20.  Flexible sigmoidoscopy or colonoscopy. You may have a sigmoidoscopy every 5 years or a colonoscopy every 10 years starting at age 21.  Prostate cancer screening. Recommendations will vary depending on your family history and other risks.  Hepatitis C blood test.  Hepatitis B blood test.  Sexually transmitted disease (STD) testing.  Diabetes screening. This is done by checking your blood sugar (glucose) after you have not eaten for a while (fasting). You may have this done every 1-3 years.  Abdominal aortic aneurysm (AAA) screening. You may need this if you are a current or former smoker.  Osteoporosis. You may be screened starting at age 52 if you are at high risk. Talk with your health care provider about your test results, treatment options, and if necessary, the need for more tests. Vaccines  Your health care provider may recommend certain vaccines, such as:  Influenza vaccine. This is recommended every year.  Tetanus, diphtheria, and acellular pertussis (Tdap, Td) vaccine. You may need a Td booster every 10 years.  Zoster vaccine. You  may need this after age 42.  Pneumococcal 13-valent conjugate (PCV13) vaccine. One dose is recommended after age 41.  Pneumococcal polysaccharide (PPSV23) vaccine. One dose is recommended after age 26. Talk to your  health care provider about which screenings and vaccines you need and how often you need them. This information is not intended to replace advice given to you by your health care provider. Make sure you discuss any questions you have with your health care provider. Document Released: 05/30/2015 Document Revised: 01/21/2016 Document Reviewed: 03/04/2015 Elsevier Interactive Patient Education  2017 [place] Prevention in the Home Falls can cause injuries. They can happen to people of all ages. There are many things you can do to make your home safe and to help prevent falls. What can I do on the outside of my home?  Regularly fix the edges of walkways and driveways and fix any cracks.  Remove anything that might make you trip as you walk through a door, such as a raised step or threshold.  Trim any bushes or trees on the path to your home.  Use bright outdoor lighting.  Clear any walking paths of anything that might make someone trip, such as rocks or tools.  Regularly check to see if handrails are loose or broken. Make sure that both sides of any steps have handrails.  Any raised decks and porches should have guardrails on the edges.  Have any leaves, snow, or ice cleared regularly.  Use sand or salt on walking paths during winter.  Clean up any spills in your garage right away. This includes oil or grease spills. What can I do in the bathroom?  Use night lights.  Install grab bars by the toilet and in the tub and shower. Do not use towel bars as grab bars.  Use non-skid mats or decals in the tub or shower.  If you need to sit down in the shower, use a plastic, non-slip stool.  Keep the floor dry. Clean up any water that spills on the floor as soon as it happens.  Remove soap buildup in the tub or shower regularly.  Attach bath mats securely with double-sided non-slip rug tape.  Do not have throw rugs and other things on the floor that can make you trip. What  can I do in the bedroom?  Use night lights.  Make sure that you have a light by your bed that is easy to reach.  Do not use any sheets or blankets that are too big for your bed. They should not hang down onto the floor.  Have a firm chair that has side arms. You can use this for support while you get dressed.  Do not have throw rugs and other things on the floor that can make you trip. What can I do in the kitchen?  Clean up any spills right away.  Avoid walking on wet floors.  Keep items that you use a lot in easy-to-reach places.  If you need to reach something above you, use a strong step stool that has a grab bar.  Keep electrical cords out of the way.  Do not use floor polish or wax that makes floors slippery. If you must use wax, use non-skid floor wax.  Do not have throw rugs and other things on the floor that can make you trip. What can I do with my stairs?  Do not leave any items on the stairs.  Make sure that there are handrails on both  sides of the stairs and use them. Fix handrails that are broken or loose. Make sure that handrails are as long as the stairways.  Check any carpeting to make sure that it is firmly attached to the stairs. Fix any carpet that is loose or worn.  Avoid having throw rugs at the top or bottom of the stairs. If you do have throw rugs, attach them to the floor with carpet tape.  Make sure that you have a light switch at the top of the stairs and the bottom of the stairs. If you do not have them, ask someone to add them for you. What else can I do to help prevent falls?  Wear shoes that:  Do not have high heels.  Have rubber bottoms.  Are comfortable and fit you well.  Are closed at the toe. Do not wear sandals.  If you use a stepladder:  Make sure that it is fully opened. Do not climb a closed stepladder.  Make sure that both sides of the stepladder are locked into place.  Ask someone to hold it for you, if possible.   Clearly mark and make sure that you can see:  Any grab bars or handrails.  First and last steps.  Where the edge of each step is.  Use tools that help you move around (mobility aids) if they are needed. These include:  Canes.  Walkers.  Scooters.  Crutches.  Turn on the lights when you go into a dark area. Replace any light bulbs as soon as they burn out.  Set up your furniture so you have a clear path. Avoid moving your furniture around.  If any of your floors are uneven, fix them.  If there are any pets around you, be aware of where they are.  Review your medicines with your doctor. Some medicines can make you feel dizzy. This can increase your chance of falling. Ask your doctor what other things that you can do to help prevent falls. This information is not intended to replace advice given to you by your health care provider. Make sure you discuss any questions you have with your health care provider. Document Released: 02/27/2009 Document Revised: 10/09/2015 Document Reviewed: 06/07/2014 Elsevier Interactive Patient Education  2017 [employer].

## 2019-10-12 NOTE — Progress Notes (Signed)
Subjective:   Chad Huang is a 79 y.o. male who presents for Medicare Annual/Subsequent preventive examination.  Review of Systems: N/A   I connected with the patient today by telephone and verified that I am speaking with the correct person using two identifiers. Location patient: home Location nurse: work Persons participating in the virtual visit: patient, Marine scientist.   I discussed the limitations, risks, security and privacy concerns of performing an evaluation and management service by telephone and the availability of in person appointments. I also discussed with the patient that there may be a patient responsible charge related to this service. The patient expressed understanding and verbally consented to this telephonic visit.    Interactive audio and video telecommunications were attempted between this nurse and patient, however failed, due to patient having technical difficulties OR patient did not have access to video capability.  We continued and completed visit with audio only.     Cardiac Risk Factors include: advanced age (>42men, >47 women);hypertension;male gender;dyslipidemia     Objective:    Vitals: There were no vitals taken for this visit.  There is no height or weight on file to calculate BMI.  Advanced Directives 10/12/2019 10/11/2018 09/26/2017 09/22/2016 06/23/2012 06/13/2012  Does Patient Have a Medical Advance Directive? Yes Yes Yes Yes Patient has advance directive, copy in chart Patient has advance directive, copy in chart  Type of Advance Directive [place];Living will [place];Living will [place];Living will [place];Living will [place];Living will [place];Living will  Copy of [place] in Chart? Yes - validated most recent copy scanned in chart (See row information) No - copy requested No - copy requested Yes - -   Pre-existing out of facility DNR order (yellow form or pink MOST form) - - - - No No    Tobacco Social History   Tobacco Use  Smoking Status Former Smoker  . Packs/day: 1.00  . Years: 20.00  . Pack years: 20.00  . Types: Cigarettes  Smokeless Tobacco Current User  . Types: Chew  Tobacco Comment   quit in 1973     Ready to quit: Not Answered Counseling given: Not Answered Comment: quit in 1973   Clinical Intake:  Pre-visit preparation completed: Yes  Pain : 0-10 Pain Score: 5  Pain Type: Acute pain Pain Location: Nose Pain Descriptors / Indicators: Aching Pain Onset: 1 to 4 weeks ago Pain Frequency: Intermittent     Nutritional Risks: None Diabetes: No  How often do you need to have someone help you when you read instructions, pamphlets, or other written materials from your doctor or pharmacy?: 1 - Never What is the last grade level you completed in school?: some college  Interpreter Needed?: No  Information entered by :: CJohnson, LPN  Past Medical History:  Diagnosis Date  . Alcohol abuse   . Anxiety and depression    charter, admission  . Back pain    (Kritzer), microdiskectomy-great result  . Benign prostatic hypertrophy    per Dr Jeffie Pollock with uro  . Cataracts, bilateral   . Cerumen impaction    left  . Chest pain   . GERD (gastroesophageal reflux disease)   . Hearing loss   . Hiatal hernia    nodule  . HTN (hypertension)   . Hyperlipidemia, mixed   . Inguinal hernia    left, hx of, repair  . Internal hemorrhoids   . Left  ear pain   . Muscle disorder    disorder, muscle/ligaminet nis, rt lower extremity  . Prostate cancer [facility]) 2010   Dr Jeffie Pollock  . Skin cancer    hx, left ear   Past Surgical History:  Procedure Laterality Date  . back microdiskectomy  10/01   Kritzer  . BACK SURGERY    . CARDIOVASCULAR STRESS TEST  2009     . COLONOSCOPY  5/05   polyps int hemorrhoids, repeat 10 yrs  . ESOPHAGOGASTRODUODENOSCOPY  03/02/00    gastritis, chronic  . ESOPHAGOGASTRODUODENOSCOPY  5/05   polyps in antrum, neg H Pylori  . ETT  05/19/07   Adenosine Muoview  . H Pylori positive  8/98  . HERNIA REPAIR    . LAPAROSCOPIC INGUINAL HERNIA REPAIR  10/96  . levitin eval  02/05/99   hip pain  . LUMBAR SPINE SURGERY  5/00  . MRI  10/07/98   degen changes 9Dr. French Ana) steroid injections  . MYELOGRAM  9/01   stenosis L4-5   Family History  Problem Relation Age of Onset  . Prostate cancer Father   . Stroke Other        grandmother  . Lung cancer Sister   . Heart disease Brother    Social History   Socioeconomic History  . Marital status: Widowed    Spouse name: Not on file  . Number of children: Not on file  . Years of education: Not on file  . Highest education level: Not on file  Occupational History  . Not on file  Tobacco Use  . Smoking status: Former Smoker    Packs/day: 1.00    Years: 20.00    Pack years: 20.00    Types: Cigarettes  . Smokeless tobacco: Current User    Types: Chew  . Tobacco comment: quit in 1973  Substance and Sexual Activity  . Alcohol use: No    Comment: none since 1996  . Drug use: No  . Sexual activity: Not Currently  Other Topics Concern  . Not on file  Social History Narrative   Married,    One daughter.    1 granddaughter.   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      [phone] (c?)   Social Determinants of Health   Financial Resource Strain: Low Risk   . Difficulty of Paying Living Expenses: Not hard at all  Food Insecurity: No Food Insecurity  . Worried About Charity fundraiser in the Last Year: Never true  . Ran Out of Food in the Last Year: Never true  Transportation Needs: No Transportation Needs  . Lack of Transportation (Medical): No  . Lack of Transportation (Non-Medical): No  Physical Activity: Sufficiently Active  . Days of Exercise per Week: 7 days  . Minutes of Exercise per Session: 30 min  Stress: No Stress Concern Present  . Feeling of  Stress : Not at all  Social Connections:   . Frequency of Communication with Friends and Family:   . Frequency of Social Gatherings with Friends and Family:   . Attends Religious Services:   . Active Member of Clubs or Organizations:   . Attends Archivist Meetings:   Marland Kitchen Marital Status:     Outpatient Encounter Medications as of 10/12/2019  Medication Sig  . amLODipine (NORVASC) 5 MG tablet TAKE 1 TABLET BY MOUTH  DAILY FOR BLOOD PRESSURE  . aspirin 81 MG tablet Take 81 mg by mouth daily.  Marland Kitchen  fluticasone (FLONASE) 50 MCG/ACT nasal spray PLACE 1 SPRAY INTO BOTH NOSTRILS 2 TIMES DAILY AS NEEDED FOR ALLERGIES OR RHINITIS.  Marland Kitchen pantoprazole (PROTONIX) 40 MG tablet TAKE 1 TABLET BY MOUTH  DAILY FOR HEARTBURN  . simvastatin (ZOCOR) 40 MG tablet TAKE 1 TABLET BY MOUTH EVERY DAY IN THE EVENING  . tamsulosin (FLOMAX) 0.4 MG CAPS capsule Take 0.4 mg by mouth daily.   No facility-administered encounter medications on file as of 10/12/2019.    Activities of Daily Living In your present state of health, do you have any difficulty performing the following activities: 10/12/2019  Hearing? Y  Comment has trouble hearing  Vision? N  Difficulty concentrating or making decisions? N  Walking or climbing stairs? N  Dressing or bathing? N  Doing errands, shopping? N  Preparing Food and eating ? N  Using the Toilet? N  In the past six months, have you accidently leaked urine? N  Do you have problems with loss of bowel control? N  Managing your Medications? N  Managing your Finances? N  Housekeeping or managing your Housekeeping? N  Some recent data might be hidden    Patient Care Team: [name], NP as PCP - General (Internal Medicine) Thelma Comp, Start as Consulting Physician (Optometry) Ladene Artist, MD as Consulting Physician (Gastroenterology) Rozetta Nunnery, MD as Consulting Physician (Otolaryngology) Irine Seal, MD as Attending Physician (Urology) Karie Chimera, MD as Referring Physician (Neurosurgery)   Assessment:   This is a routine wellness examination for Benjamen.  Exercise Activities and Dietary recommendations Current Exercise Habits: Home exercise routine, Type of exercise: walking, Time (Minutes): 30, Frequency (Times/Week): 7, Weekly Exercise (Minutes/Week): 210, Intensity: Moderate, Exercise limited by: None identified  Goals    . Patient Stated     Starting 10/11/18, I will continue to take medications as prescribed.     . Patient Stated     10/12/2019, I will continue to exercise daily for 30 minutes.        Fall Risk Fall Risk  10/12/2019 10/11/2018 09/26/2017 09/22/2016  Falls in the past year? 0 0 No No  Number falls in past yr: 0 - - -  Injury with Fall? 0 - - -  Risk for fall due to : Medication side effect - - -  Follow up Falls evaluation completed;Falls prevention discussed - - -   Is the patient's home free of loose throw rugs in walkways, pet beds, electrical cords, etc?   yes      Grab bars in the bathroom? no      Handrails on the stairs?   yes      Adequate lighting?   yes  Timed Get Up and Go Performed: N/A  Depression Screen PHQ 2/9 Scores 10/12/2019 10/11/2018 09/26/2017 09/22/2016  PHQ - 2 Score 0 0 0 0  PHQ- 9 Score 0 0 0 -    Cognitive Function MMSE - Mini Mental State Exam 10/12/2019 10/11/2018 09/26/2017 09/22/2016  Orientation to time 5 5 5 5   Orientation to Place 5 5 5 5   Registration 3 3 3 3   Attention/ Calculation 5 0 0 5  Recall 3 3 3 3   Language- name 2 objects - 0 0 0  Language- repeat 1 1 1 1   Language- follow 3 step command - 0 3 3  Language- read & follow direction - 0 0 0  Write a sentence - 0 0 0  Copy design - 0 0 0  Total score -  17 20 25   Mini Cog  Mini-Cog screen was completed. Maximum score is 22. A value of 0 denotes this part of the MMSE was not completed or the patient failed this part of the Mini-Cog screening.       Immunization History  Administered Date(s) Administered   . Influenza-Unspecified 05/31/2016  . Pneumococcal Conjugate-13 09/22/2016  . Pneumococcal Polysaccharide-23 05/17/2008  . Td 06/17/1993, 09/18/2008    Qualifies for Shingles Vaccine: Yes   Screening Tests Health Maintenance  Topic Date Due  . COVID-19 Vaccine (1) Never done  . TETANUS/TDAP  05/16/2020 (Originally 09/19/2018)  . INFLUENZA VACCINE  12/16/2019  . PNA vac Low Risk Adult  Completed   Cancer Screenings: Lung: Low Dose CT Chest recommended if Age 67-80 years, 30 pack-year currently smoking OR have quit w/in 15 years. Patient does not qualify. Colorectal: no longer required  Additional Screenings:  Hepatitis C Screening: N/A      Plan:   Patient will continue to walk everyday for 30 minutes.   I have personally reviewed and noted the following in the patient's chart:   . Medical and social history . Use of alcohol, tobacco or illicit drugs  . Current medications and supplements . Functional ability and status . Nutritional status . Physical activity . Advanced directives . List of other physicians . Hospitalizations, surgeries, and ER visits in previous 12 months . Vitals . Screenings to include cognitive, depression, and falls . Referrals and appointments  In addition, I have reviewed and discussed with patient certain preventive protocols, quality metrics, and best practice recommendations. A written personalized care plan for preventive services as well as general preventive health recommendations were provided to patient.     Andrez Grime, LPN  [id number]

## 2019-10-16 ENCOUNTER — Other Ambulatory Visit: Payer: Self-pay

## 2019-10-16 ENCOUNTER — Encounter: Payer: Self-pay | Admitting: Primary Care

## 2019-10-16 ENCOUNTER — Ambulatory Visit (INDEPENDENT_AMBULATORY_CARE_PROVIDER_SITE_OTHER): Payer: Medicare Other | Admitting: Primary Care

## 2019-10-16 VITALS — BP 142/82 | HR 72 | Temp 96.7°F | Ht 69.0 in | Wt 219.5 lb

## 2019-10-16 DIAGNOSIS — Z0000 Encounter for general adult medical examination without abnormal findings: Secondary | ICD-10-CM

## 2019-10-16 DIAGNOSIS — I10 Essential (primary) hypertension: Secondary | ICD-10-CM

## 2019-10-16 DIAGNOSIS — E782 Mixed hyperlipidemia: Secondary | ICD-10-CM

## 2019-10-16 DIAGNOSIS — K219 Gastro-esophageal reflux disease without esophagitis: Secondary | ICD-10-CM

## 2019-10-16 DIAGNOSIS — Z23 Encounter for immunization: Secondary | ICD-10-CM

## 2019-10-16 DIAGNOSIS — C61 Malignant neoplasm of prostate: Secondary | ICD-10-CM

## 2019-10-16 MED ORDER — ZOSTER VAC RECOMB ADJUVANTED 50 MCG/0.5ML IM SUSR: 0.5000 mL | Freq: Once | INTRAMUSCULAR | 1 refills | Status: AC

## 2019-10-16 NOTE — Assessment & Plan Note (Signed)
Shingrix due, Rx provided today. Other vaccinations UTD, discussed need for Covid-19 series.  PSA UTD, following with Urology. No need for colonoscopy given age.  Encouraged a healthy diet, regular exercise. Exam today stable.  Labs reviewed.

## 2019-10-16 NOTE — Progress Notes (Signed)
Subjective:    Patient ID: Chad Huang, male    DOB: May 03, 1941, 79 y.o.   MRN: [id number]  HPI  This visit occurred during the SARS-CoV-2 public health emergency.  Safety protocols were in place, including screening questions prior to the visit, additional usage of staff PPE, and extensive cleaning of exam room while observing appropriate contact time as indicated for disinfecting solutions.   Chad Huang is a 79 year old male who presents today for complete physical.  Immunizations: -Tetanus: Completed in 2010 -Influenza: Did not complete last season  -Shingles: Never completed,  -Pneumonia: Completed Prevnar and Pneumovax -Covid-19: Does not plan on getting  Diet: He endorses a fair diet. Mostly drinking water, also come coffee and milk.  Exercise: He is active at home  Eye exam: Completed in 2020 Dental exam: No recent exam  Colonoscopy: No recent colonoscopy PSA: 7.16 in 2021, 5.15 in 2020, 4.72 in 2019, 5.07 in 2018. Following with Dr. Jeffie Pollock (Urology)  BP Readings from Last 3 Encounters:  10/16/19 (!) 142/82  06/05/19 (!) 143/82  10/12/18 (!) 144/90      Review of Systems  Constitutional: Negative for unexpected weight change.  HENT: Negative for rhinorrhea.   Respiratory: Negative for cough and shortness of breath.   Cardiovascular: Negative for chest pain.  Gastrointestinal: Negative for constipation and diarrhea.  Genitourinary: Negative for difficulty urinating and hematuria.       LUTS improved with tamsulosin  Musculoskeletal: Negative for arthralgias and myalgias.  Skin: Negative for rash.  Allergic/Immunologic: Positive for environmental allergies.  Neurological: Negative for dizziness, numbness and headaches.  Psychiatric/Behavioral: The patient is not nervous/anxious.        Past Medical History:  Diagnosis Date  . Alcohol abuse   . Anxiety and depression    charter, admission  . Back pain    (Kritzer), microdiskectomy-great result  . Benign  prostatic hypertrophy    per Dr Jeffie Pollock with uro  . Cataracts, bilateral   . Cerumen impaction    left  . Chest pain   . GERD (gastroesophageal reflux disease)   . Hearing loss   . Hiatal hernia    nodule  . HTN (hypertension)   . Hyperlipidemia, mixed   . Inguinal hernia    left, hx of, repair  . Internal hemorrhoids   . Left ear pain   . Muscle disorder    disorder, muscle/ligaminet nis, rt lower extremity  . Prostate cancer [facility]) 2010   Dr Jeffie Pollock  . Skin cancer    hx, left ear     Social History   Socioeconomic History  . Marital status: Widowed    Spouse name: Not on file  . Number of children: Not on file  . Years of education: Not on file  . Highest education level: Not on file  Occupational History  . Not on file  Tobacco Use  . Smoking status: Former Smoker    Packs/day: 1.00    Years: 20.00    Pack years: 20.00    Types: Cigarettes  . Smokeless tobacco: Current User    Types: Chew  . Tobacco comment: quit in 1973  Substance and Sexual Activity  . Alcohol use: No    Comment: none since 1996  . Drug use: No  . Sexual activity: Not Currently  Other Topics Concern  . Not on file  Social History Narrative   Married,    One daughter.    1 granddaughter.   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   Active.      [phone] (c?)   Social Determinants of Health   Financial Resource Strain: Low Risk   . Difficulty of Paying Living Expenses: Not hard at all  Food Insecurity: No Food Insecurity  . Worried About Charity fundraiser in the Last Year: Never true  . Ran Out of Food in the Last Year: Never true  Transportation Needs: No Transportation Needs  . Lack of Transportation (Medical): No  . Lack of Transportation (Non-Medical): No  Physical Activity: Sufficiently Active  . Days of Exercise per Week: 7 days  . Minutes of Exercise per Session: 30 min  Stress: No Stress Concern Present  . Feeling of Stress : Not at all  Social Connections:   . Frequency  of Communication with Friends and Family:   . Frequency of Social Gatherings with Friends and Family:   . Attends Religious Services:   . Active Member of Clubs or Organizations:   . Attends Archivist Meetings:   Marland Kitchen Marital Status:   Intimate Partner Violence: Not At Risk  . Fear of Current or Ex-Partner: No  . Emotionally Abused: No  . Physically Abused: No  . Sexually Abused: No    Past Surgical History:  Procedure Laterality Date  . back microdiskectomy  10/01   Kritzer  . BACK SURGERY    . CARDIOVASCULAR STRESS TEST  2009   [place]  . COLONOSCOPY  5/05   polyps int hemorrhoids, repeat 10 yrs  . ESOPHAGOGASTRODUODENOSCOPY  03/02/00   gastritis, chronic  . ESOPHAGOGASTRODUODENOSCOPY  5/05   polyps in antrum, neg H Pylori  . ETT  05/19/07   Adenosine Muoview  . H Pylori positive  8/98  . HERNIA REPAIR    . LAPAROSCOPIC INGUINAL HERNIA REPAIR  10/96  . levitin eval  02/05/99   hip pain  . LUMBAR SPINE SURGERY  5/00  . MRI  10/07/98   degen changes 9Dr. French Ana) steroid injections  . MYELOGRAM  9/01   stenosis L4-5    Family History  Problem Relation Age of Onset  . Prostate cancer Father   . Stroke Other        grandmother  . Lung cancer Sister   . Heart disease Brother     Allergies  Allergen Reactions  . Clarithromycin     REACTION: unspecified  . Codeine Phosphate     REACTION: nausea  . Nabumetone     REACTION: Muscle cramps    Current Outpatient Medications on File Prior to Visit  Medication Sig Dispense Refill  . amLODipine (NORVASC) 5 MG tablet TAKE 1 TABLET BY MOUTH  DAILY FOR BLOOD PRESSURE 90 tablet 1  . aspirin 81 MG tablet Take 81 mg by mouth daily.    . fluticasone (FLONASE) 50 MCG/ACT nasal spray PLACE 1 SPRAY INTO BOTH NOSTRILS 2 TIMES DAILY AS NEEDED FOR ALLERGIES OR RHINITIS. 48 mL 1  . pantoprazole (PROTONIX) 40 MG tablet TAKE 1 TABLET BY MOUTH  DAILY FOR HEARTBURN 90 tablet 1  . simvastatin (ZOCOR) 40 MG tablet TAKE 1 TABLET BY  MOUTH EVERY DAY IN THE EVENING 90 tablet 1  . tamsulosin (FLOMAX) 0.4 MG CAPS capsule Take 0.4 mg by mouth daily.     No current facility-administered medications on file prior to visit.    BP (!) 142/82   Pulse 72   Temp (!) 96.7 F (35.9 C) (Temporal)   Ht 5\' 9"  (1.753 m)   Wt 219  lb 8 oz (99.6 kg)   SpO2 97%   BMI 32.41 kg/m    Objective:   Physical Exam  Constitutional: He is oriented to person, place, and time. He appears well-nourished.  HENT:  Right Ear: Tympanic membrane and ear canal normal.  Left Ear: Tympanic membrane and ear canal normal.  Mouth/Throat: Oropharynx is clear and moist.  Eyes: Pupils are equal, round, and reactive to light. EOM are normal.  Cardiovascular: Normal rate and regular rhythm.  Respiratory: Effort normal and breath sounds normal.  GI: Soft. Bowel sounds are normal. There is no abdominal tenderness.  Musculoskeletal:        General: Normal range of motion.     Cervical back: Neck supple.  Neurological: He is alert and oriented to person, place, and time. No cranial nerve deficit.  Reflex Scores:      Patellar reflexes are 2+ on the right side and 2+ on the left side. Skin: Skin is warm and dry.  Psychiatric: He has a normal mood and affect.           Assessment & Plan:

## 2019-10-16 NOTE — Assessment & Plan Note (Signed)
Intermittent flares with certain triggers. Continue pantoprazole 40 mg.

## 2019-10-16 NOTE — Assessment & Plan Note (Signed)
Recent levels stable, continue simvastatin

## 2019-10-16 NOTE — Patient Instructions (Addendum)
Take the shingles vaccine to your pharmacy.  Make sure to exercise daily.  It's important to improve your diet by reducing consumption of fast food, fried food, processed snack foods, sugary drinks. Increase consumption of fresh vegetables and fruits, whole grains, water.  Ensure you are drinking 64 ounces of water daily.  Follow up with Dr. Jeffie Pollock as scheduled.  It was a pleasure to see you today!   Preventive Care 79 Years and Older, Male Preventive care refers to lifestyle choices and visits with your health care provider that can promote health and wellness. This includes:  A yearly physical exam. This is also called an annual well check.  Regular dental and eye exams.  Immunizations.  Screening for certain conditions.  Healthy lifestyle choices, such as diet and exercise. What can I expect for my preventive care visit? Physical exam Your health care provider will check:  Height and weight. These may be used to calculate body mass index (BMI), which is a measurement that tells if you are at a healthy weight.  Heart rate and blood pressure.  Your skin for abnormal spots. Counseling Your health care provider may ask you questions about:  Alcohol, tobacco, and drug use.  Emotional well-being.  Home and relationship well-being.  Sexual activity.  Eating habits.  History of falls.  Memory and ability to understand (cognition).  Work and work Statistician. What immunizations do I need?  Influenza (flu) vaccine  This is recommended every year. Tetanus, diphtheria, and pertussis (Tdap) vaccine  You may need a Td booster every 10 years. Varicella (chickenpox) vaccine  You may need this vaccine if you have not already been vaccinated. Zoster (shingles) vaccine  You may need this after age 79. Pneumococcal conjugate (PCV13) vaccine  One dose is recommended after age 79. Pneumococcal polysaccharide (PPSV23) vaccine  One dose is recommended after age  79. Measles, mumps, and rubella (MMR) vaccine  You may need at least one dose of MMR if you were born in 1957 or later. You may also need a second dose. Meningococcal conjugate (MenACWY) vaccine  You may need this if you have certain conditions. Hepatitis A vaccine  You may need this if you have certain conditions or if you travel or work in places where you may be exposed to hepatitis A. Hepatitis B vaccine  You may need this if you have certain conditions or if you travel or work in places where you may be exposed to hepatitis B. Haemophilus influenzae type b (Hib) vaccine  You may need this if you have certain conditions. You may receive vaccines as individual doses or as more than one vaccine together in one shot (combination vaccines). Talk with your health care provider about the risks and benefits of combination vaccines. What tests do I need? Blood tests  Lipid and cholesterol levels. These may be checked every 5 years, or more frequently depending on your overall health.  Hepatitis C test.  Hepatitis B test. Screening  Lung cancer screening. You may have this screening every year starting at age 79 if you have a 30-pack-year history of smoking and currently smoke or have quit within the past 15 years.  Colorectal cancer screening. All adults should have this screening starting at age 79 and continuing until age 53. Your health care provider may recommend screening at age 62 if you are at increased risk. You will have tests every 1-10 years, depending on your results and the type of screening test.  Prostate cancer screening. Recommendations will  vary depending on your family history and other risks.  Diabetes screening. This is done by checking your blood sugar (glucose) after you have not eaten for a while (fasting). You may have this done every 1-3 years.  Abdominal aortic aneurysm (AAA) screening. You may need this if you are a current or former smoker.  Sexually  transmitted disease (STD) testing. Follow these instructions at home: Eating and drinking  Eat a diet that includes fresh fruits and vegetables, whole grains, lean protein, and low-fat dairy products. Limit your intake of foods with high amounts of sugar, saturated fats, and salt.  Take vitamin and mineral supplements as recommended by your health care provider.  Do not drink alcohol if your health care provider tells you not to drink.  If you drink alcohol: ? Limit how much you have to 0-2 drinks a day. ? Be aware of how much alcohol is in your drink. In the [place] one drink equals one 12 oz bottle of beer (355 mL), one 5 oz glass of wine (148 mL), or one 1 oz glass of hard liquor (44 mL). Lifestyle  Take daily care of your teeth and gums.  Stay active. Exercise for at least 30 minutes on 5 or more days each week.  Do not use any products that contain nicotine or tobacco, such as cigarettes, e-cigarettes, and chewing tobacco. If you need help quitting, ask your health care provider.  If you are sexually active, practice safe sex. Use a condom or other form of protection to prevent STIs (sexually transmitted infections).  Talk with your health care provider about taking a low-dose aspirin or statin. What's next?  Visit your health care provider once a year for a well check visit.  Ask your health care provider how often you should have your eyes and teeth checked.  Stay up to date on all vaccines. This information is not intended to replace advice given to you by your health care provider. Make sure you discuss any questions you have with your health care provider. Document Revised: 04/27/2018 Document Reviewed: 04/27/2018 Elsevier Patient Education  2020 [employer].

## 2019-10-16 NOTE — Assessment & Plan Note (Addendum)
Following with Urology, recent increase in PSA. Next visit with Urology due soon.

## 2019-10-16 NOTE — Assessment & Plan Note (Signed)
Stable in the office today, continue amlodipine 5 mg.

## 2019-11-09 ENCOUNTER — Telehealth: Payer: Self-pay | Admitting: Primary Care

## 2019-11-09 NOTE — Telephone Encounter (Signed)
Noted, placed in Chan's in box.  Ready for pickup.

## 2019-11-09 NOTE — Telephone Encounter (Signed)
I am happy to help, please find out the reason for which he is requesting the handicap placard.  For example, unable to walk 200 feet without resting, arthritis, inability to walk without use of assistive device. etc

## 2019-11-09 NOTE — Telephone Encounter (Signed)
Form in PCP's inbox for review

## 2019-11-09 NOTE — Telephone Encounter (Signed)
Spoke to pt's daughter (DPR) and she states that they just need a second placard for limited mobility. Daughter requests notification of when form is ready for pickup.

## 2019-11-09 NOTE — Telephone Encounter (Signed)
Patient came in office with Handicap placard form and it is labeled and placed on the tower

## 2019-11-13 NOTE — Telephone Encounter (Signed)
Message left for patient to return my call.   Ready for pick up. Left in the front office.

## 2019-11-15 ENCOUNTER — Other Ambulatory Visit: Payer: Self-pay | Admitting: Primary Care

## 2019-11-15 DIAGNOSIS — E785 Hyperlipidemia, unspecified: Secondary | ICD-10-CM

## 2019-12-11 ENCOUNTER — Other Ambulatory Visit: Payer: Self-pay | Admitting: Primary Care

## 2019-12-11 DIAGNOSIS — K219 Gastro-esophageal reflux disease without esophagitis: Secondary | ICD-10-CM

## 2020-02-19 ENCOUNTER — Other Ambulatory Visit: Payer: Self-pay | Admitting: Primary Care

## 2020-02-19 DIAGNOSIS — I10 Essential (primary) hypertension: Secondary | ICD-10-CM

## 2020-04-15 DIAGNOSIS — H26492 Other secondary cataract, left eye: Secondary | ICD-10-CM | POA: Diagnosis not present

## 2020-04-21 DIAGNOSIS — Z961 Presence of intraocular lens: Secondary | ICD-10-CM | POA: Diagnosis not present

## 2020-04-21 DIAGNOSIS — I10 Essential (primary) hypertension: Secondary | ICD-10-CM | POA: Diagnosis not present

## 2020-04-21 DIAGNOSIS — H26492 Other secondary cataract, left eye: Secondary | ICD-10-CM | POA: Diagnosis not present

## 2020-04-22 DIAGNOSIS — H26492 Other secondary cataract, left eye: Secondary | ICD-10-CM | POA: Diagnosis not present

## 2020-04-22 DIAGNOSIS — Z961 Presence of intraocular lens: Secondary | ICD-10-CM | POA: Diagnosis not present

## 2020-05-04 IMAGING — MR MR PROSTATE WO/W CM
56 series · 56 of 56 positions shown · IV contrast (20 ml multihance)
Comparison: None.

CLINICAL DATA: Elevated PSA of 7.09 on 06/27/2019. Previous biopsy
09/16/2011 showed Gleason 3+3=6 at the left apex and also in a
right-sided lesion.

EXAM:
MR PROSTATE WITHOUT AND WITH CONTRAST
TECHNIQUE: Multiplanar multisequence MRI images were obtained of the pelvis
centered about the prostate. Pre and post contrast images were
obtained.
CONTRAST:  20mL MULTIHANCE GADOBENATE DIMEGLUMINE 529 MG/ML IV SOLN
Creatinine was obtained on site at [HOSPITAL] at [HOSPITAL].
Results: Creatinine 1.2 mg/dL.

[Series 4: bSSFP fat-sat · axial · 8.0mm · 0.74mm/px · 1 of 28 slices shown]
[im 1/28]
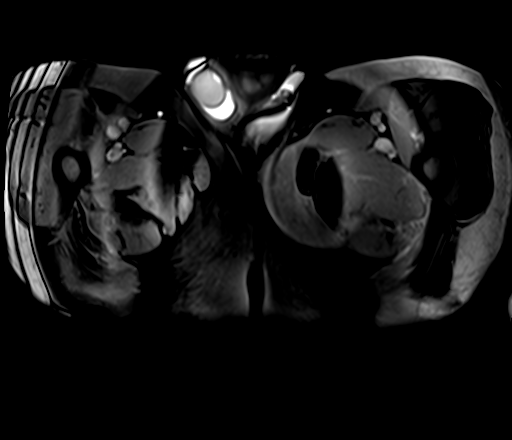

[Series 5: T1 · axial · 5.0mm · 1.25mm/px · 1 of 80 slices shown]
[im 1/80]
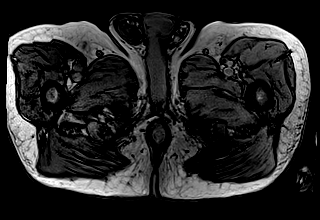

[Series 6: T2 · coronal · 3.5mm · 0.56mm/px · 1 of 23 slices shown (1 of 3)]
[im 1/23]
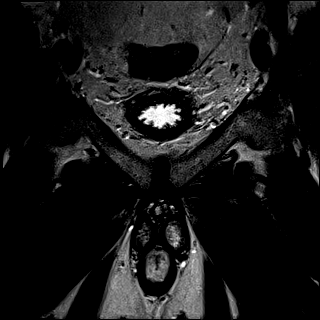

[Series 7: DWI · axial · 3.5mm · 1.75mm/px · 1 of 60 slices shown (1 of 3)]
[im 1/60]
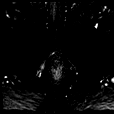

[Series 8: DWI · axial · 3.5mm · 1.75mm/px · 1 of 20 slices shown (2 of 3)]
[im 1/20]
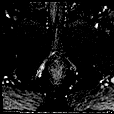

[Series 9: DWI · axial · 3.5mm · 1.56mm/px · 1 of 20 slices shown (3 of 3)]
[im 1/20]
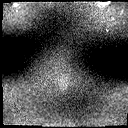

[Series 10: T2 · axial · 3.5mm · 0.56mm/px · 1 of 23 slices shown (2 of 3)]
[im 1/23]
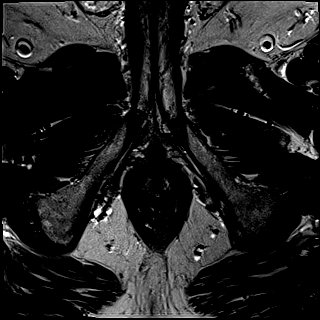

[Series 11: T2 · axial · 1.0mm · 1.04mm/px · 1 of 80 slices shown (3 of 3)]
[im 1/80]
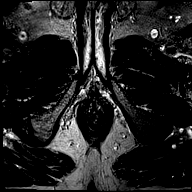

[Series 12: pre t1_twist_tra_dyn_ttc=5.3s · axial · non-contrast · 3.5mm · 0.83mm/px · 1 of 20 slices shown]
[im 1/20]
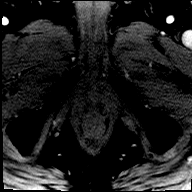

[Series 13: post t1_twist_tra_dyn-copy center · axial · 3.5mm · 0.83mm/px · 1 of 20 slices shown (1 of 24)]
[im 1/20]
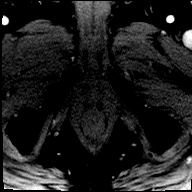

[Series 14: post t1_twist_tra_dyn-copy center · axial · 3.5mm · 0.83mm/px · 1 of 20 slices shown (2 of 24)]
[im 1/20]
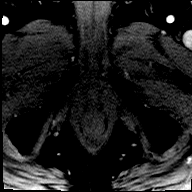

[Series 15: post t1_twist_tra_dyn-copy cent_sub_ttc=(id) · axial · 3.5mm · 0.83mm/px · 1 of 18 slices shown (1 of 23)]
[im 1/18]
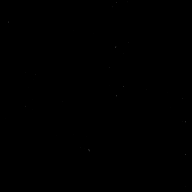

[Series 16: post t1_twist_tra_dyn-copy center · axial · 3.5mm · 0.83mm/px · 1 of 20 slices shown (3 of 24)]
[im 1/20]
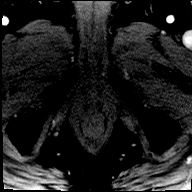

[Series 17: post t1_twist_tra_dyn-copy cent_sub_ttc=(id) · axial · 3.5mm · 0.83mm/px · 1 of 20 slices shown (2 of 23)]
[im 1/20]
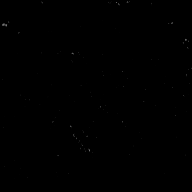

[Series 18: post t1_twist_tra_dyn-copy center · axial · 3.5mm · 0.83mm/px · 1 of 20 slices shown (4 of 24)]
[im 1/20]
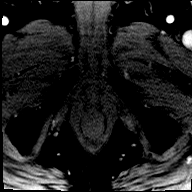

[Series 19: post t1_twist_tra_dyn-copy cent_sub_ttc=(id) · axial · 3.5mm · 0.83mm/px · 1 of 19 slices shown (3 of 23)]
[im 1/19]
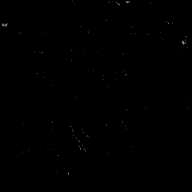

[Series 20: post t1_twist_tra_dyn-copy center · axial · 3.5mm · 0.83mm/px · 1 of 20 slices shown (5 of 24)]
[im 1/20]
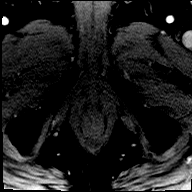

[Series 21: post t1_twist_tra_dyn-copy cent_sub_ttc=(id) · axial · 3.5mm · 0.83mm/px · 1 of 19 slices shown (4 of 23)]
[im 1/19]
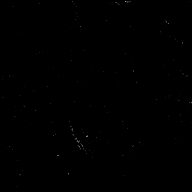

[Series 22: post t1_twist_tra_dyn-copy center · axial · 3.5mm · 0.83mm/px · 1 of 20 slices shown (6 of 24)]
[im 1/20]
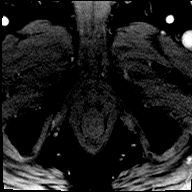

[Series 23: post t1_twist_tra_dyn-copy cent_sub_ttc=(id) · axial · 3.5mm · 0.83mm/px · 1 of 18 slices shown (5 of 23)]
[im 1/18]
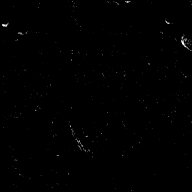

[Series 24: post t1_twist_tra_dyn-copy center · axial · 3.5mm · 0.83mm/px · 1 of 20 slices shown (7 of 24)]
[im 1/20]
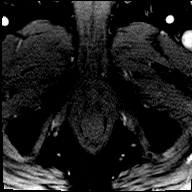

[Series 25: post t1_twist_tra_dyn-copy cent_sub_ttc=(id) · axial · 3.5mm · 0.83mm/px · 1 of 20 slices shown (6 of 23)]
[im 1/20]
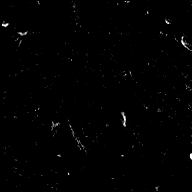

[Series 26: post t1_twist_tra_dyn-copy center · axial · 3.5mm · 0.83mm/px · 1 of 20 slices shown (8 of 24)]
[im 1/20]
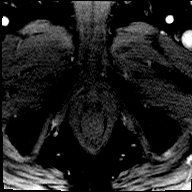

[Series 27: post t1_twist_tra_dyn-copy cent_sub_ttc=(id) · axial · 3.5mm · 0.83mm/px · 1 of 20 slices shown (7 of 23)]
[im 1/20]
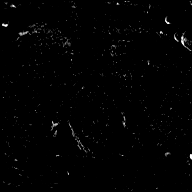

[Series 28: post t1_twist_tra_dyn-copy center · axial · 3.5mm · 0.83mm/px · 1 of 20 slices shown (9 of 24)]
[im 1/20]
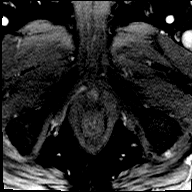

[Series 29: post t1_twist_tra_dyn-copy cent_sub_ttc=(id) · axial · 3.5mm · 0.83mm/px · 1 of 20 slices shown (8 of 23)]
[im 1/20]
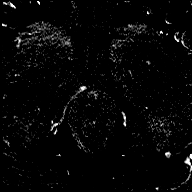

[Series 30: post t1_twist_tra_dyn-copy center · axial · 3.5mm · 0.83mm/px · 1 of 20 slices shown (10 of 24)]
[im 1/20]
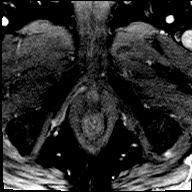

[Series 31: post t1_twist_tra_dyn-copy cent_sub_ttc=(id) · axial · 3.5mm · 0.83mm/px · 1 of 20 slices shown (9 of 23)]
[im 1/20]
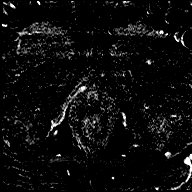

[Series 32: post t1_twist_tra_dyn-copy center · axial · 3.5mm · 0.83mm/px · 1 of 20 slices shown (11 of 24)]
[im 1/20]
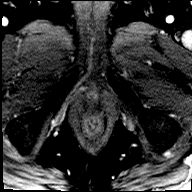

[Series 33: post t1_twist_tra_dyn-copy cent_sub_ttc=(id) · axial · 3.5mm · 0.83mm/px · 1 of 20 slices shown (10 of 23)]
[im 1/20]
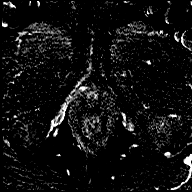

[Series 34: post t1_twist_tra_dyn-copy center · axial · 3.5mm · 0.83mm/px · 1 of 20 slices shown (12 of 24)]
[im 1/20]
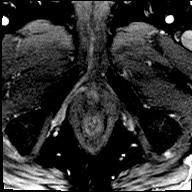

[Series 35: post t1_twist_tra_dyn-copy cent_sub_ttc=(id) · axial · 3.5mm · 0.83mm/px · 1 of 20 slices shown (11 of 23)]
[im 1/20]
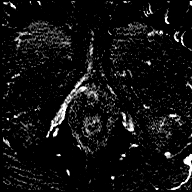

[Series 36: post t1_twist_tra_dyn-copy center · axial · 3.5mm · 0.83mm/px · 1 of 20 slices shown (13 of 24)]
[im 1/20]
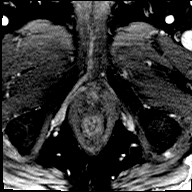

[Series 37: post t1_twist_tra_dyn-copy cent_sub_ttc=(id) · axial · 3.5mm · 0.83mm/px · 1 of 20 slices shown (12 of 23)]
[im 1/20]
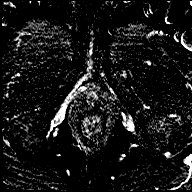

[Series 38: post t1_twist_tra_dyn-copy center · axial · 3.5mm · 0.83mm/px · 1 of 20 slices shown (14 of 24)]
[im 1/20]
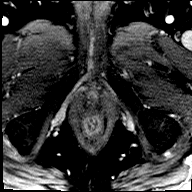

[Series 39: post t1_twist_tra_dyn-copy cent_sub_ttc=(id) · axial · 3.5mm · 0.83mm/px · 1 of 20 slices shown (13 of 23)]
[im 1/20]
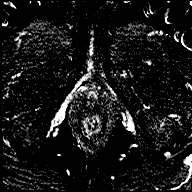

[Series 40: post t1_twist_tra_dyn-copy center · axial · 3.5mm · 0.83mm/px · 1 of 20 slices shown (15 of 24)]
[im 1/20]
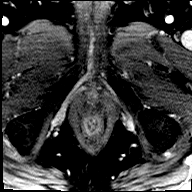

[Series 41: post t1_twist_tra_dyn-copy cent_sub_ttc=(id) · axial · 3.5mm · 0.83mm/px · 1 of 19 slices shown (14 of 23)]
[im 1/19]
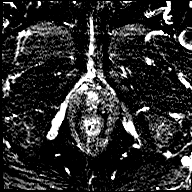

[Series 42: post t1_twist_tra_dyn-copy center · axial · 3.5mm · 0.83mm/px · 1 of 20 slices shown (16 of 24)]
[im 1/20]
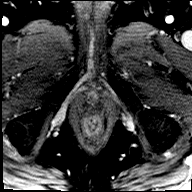

[Series 43: post t1_twist_tra_dyn-copy cent_sub_ttc=(id) · axial · 3.5mm · 0.83mm/px · 1 of 20 slices shown (15 of 23)]
[im 1/20]
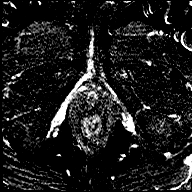

[Series 44: post t1_twist_tra_dyn-copy center · axial · 3.5mm · 0.83mm/px · 1 of 20 slices shown (17 of 24)]
[im 1/20]
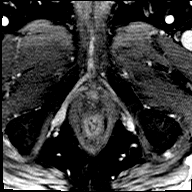

[Series 45: post t1_twist_tra_dyn-copy cent_sub_ttc=(id) · axial · 3.5mm · 0.83mm/px · 1 of 19 slices shown (16 of 23)]
[im 1/19]
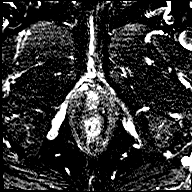

[Series 46: post t1_twist_tra_dyn-copy center · axial · 3.5mm · 0.83mm/px · 1 of 20 slices shown (18 of 24)]
[im 1/20]
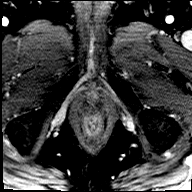

[Series 47: post t1_twist_tra_dyn-copy cent_sub_ttc=(id) · axial · 3.5mm · 0.83mm/px · 1 of 19 slices shown (17 of 23)]
[im 1/19]
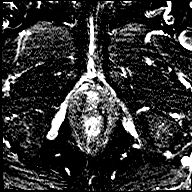

[Series 48: post t1_twist_tra_dyn-copy center · axial · 3.5mm · 0.83mm/px · 1 of 20 slices shown (19 of 24)]
[im 1/20]
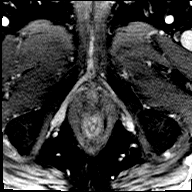

[Series 49: post t1_twist_tra_dyn-copy cent_sub_ttc=(id) · axial · 3.5mm · 0.83mm/px · 1 of 20 slices shown (18 of 23)]
[im 1/20]
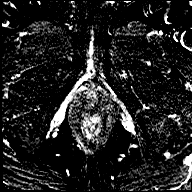

[Series 50: post t1_twist_tra_dyn-copy center · axial · 3.5mm · 0.83mm/px · 1 of 20 slices shown (20 of 24)]
[im 1/20]
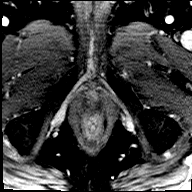

[Series 51: post t1_twist_tra_dyn-copy cent_sub_ttc=(id) · axial · 3.5mm · 0.83mm/px · 1 of 20 slices shown (19 of 23)]
[im 1/20]
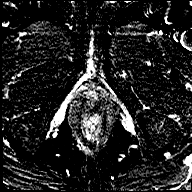

[Series 52: post t1_twist_tra_dyn-copy center · axial · 3.5mm · 0.83mm/px · 1 of 20 slices shown (21 of 24)]
[im 1/20]
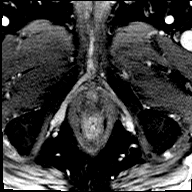

[Series 53: post t1_twist_tra_dyn-copy cent_sub_ttc=(id) · axial · 3.5mm · 0.83mm/px · 1 of 20 slices shown (20 of 23)]
[im 1/20]
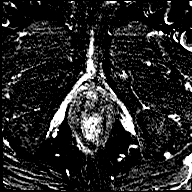

[Series 54: post t1_twist_tra_dyn-copy center · axial · 3.5mm · 0.83mm/px · 1 of 20 slices shown (22 of 24)]
[im 1/20]
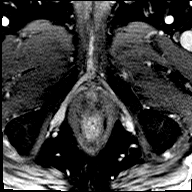

[Series 55: post t1_twist_tra_dyn-copy cent_sub_ttc=(id) · axial · 3.5mm · 0.83mm/px · 1 of 20 slices shown (21 of 23)]
[im 1/20]
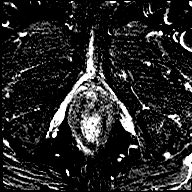

[Series 56: post t1_twist_tra_dyn-copy center · axial · 3.5mm · 0.83mm/px · 1 of 20 slices shown (23 of 24)]
[im 1/20]
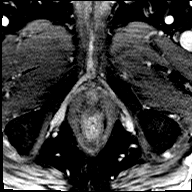

[Series 57: post t1_twist_tra_dyn-copy cent_sub_ttc=(id) · axial · 3.5mm · 0.83mm/px · 1 of 20 slices shown (22 of 23)]
[im 1/20]
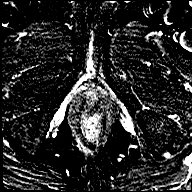

[Series 58: post t1_twist_tra_dyn-copy center · axial · 3.5mm · 0.83mm/px · 1 of 20 slices shown (24 of 24)]
[im 1/20]
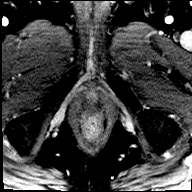

[Series 59: post t1_twist_tra_dyn-copy cent_sub_ttc=(id) · axial · 3.5mm · 0.83mm/px · 1 of 20 slices shown (23 of 23)]
[im 1/20]
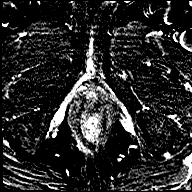

[56 of 56 positions shown; findings below may reference images not displayed]

FINDINGS: Prostate:

Region of interest # 1: PI-RADS category 4 lesion of the right
posterolateral peripheral zone in the mid gland, with low T2 and
reduced ADC map activity as well as early enhancement. This measures
0.74 cubic cm (1.4 by 0.9 by 1.0 cm).

Region of interest # 2: PI-RADS category 4 lesion of the left
posterolateral and posteromedial peripheral zone at the apex with
low T2 and ADC map activity and accentuated enhancement. This is
shown on image [DATE]. This lesion measures 0.92 cubic cm (1.4 by
by 1.1 cm).

Indistinct reduced T2 signal in the remainder of the peripheral zone
is not focal and not associated with focal enhancement.

There is some indistinct low T2 signal throughout much of the
transitional zones, but without compelling associated restriction of
diffusion on the [id number] calculated images.

Suspected calcifications along the posterior margin of the
transition zones.

Volume: 3D volumetric analysis: Prostate volume 58.02 cubic cm (5.4
by 4.9 by 5.4 cm).

Transcapsular spread:  Absent

Seminal vesicle involvement: Absent

Neurovascular bundle involvement: Absent

Pelvic adenopathy: Absent

Bone metastasis: Absent

Other findings: Small right scrotal hydrocele.
IMPRESSION: 1. PI-RADS category 4 lesion in the right posterolateral peripheral
zone in the mid gland. Separate PI-RADS category 4 lesion in the
left posterolateral and posteromedial peripheral zone at the apex.
Targeting data sent to [facility].
2. There is indistinct reduced T2 signal throughout much of the
transition zones, but without associated restriction of diffusion to
heighten suspicion in this region.
3. Mild prostatomegaly.

## 2020-05-08 ENCOUNTER — Other Ambulatory Visit: Payer: Self-pay | Admitting: Primary Care

## 2020-05-11 ENCOUNTER — Other Ambulatory Visit: Payer: Self-pay | Admitting: Primary Care

## 2020-05-11 DIAGNOSIS — K219 Gastro-esophageal reflux disease without esophagitis: Secondary | ICD-10-CM

## 2020-05-12 ENCOUNTER — Telehealth (INDEPENDENT_AMBULATORY_CARE_PROVIDER_SITE_OTHER): Payer: Medicare Other | Admitting: Family Medicine

## 2020-05-12 ENCOUNTER — Encounter: Payer: Self-pay | Admitting: Family Medicine

## 2020-05-12 ENCOUNTER — Telehealth: Payer: Self-pay

## 2020-05-12 ENCOUNTER — Other Ambulatory Visit: Payer: Self-pay

## 2020-05-12 VITALS — BP 138/74 | HR 67 | Temp 98.3°F | Wt 217.0 lb

## 2020-05-12 DIAGNOSIS — U071 COVID-19: Secondary | ICD-10-CM | POA: Diagnosis not present

## 2020-05-12 NOTE — Progress Notes (Signed)
    I connected with Chad Huang on 05/12/20 at  4:20 PM EST by video and verified that I am speaking with the correct person using two identifiers.   I discussed the limitations, risks, security and privacy concerns of performing an evaluation and management service by video and the availability of in person appointments. I also discussed with the patient that there may be a patient responsible charge related to this service. The patient expressed understanding and agreed to proceed.  Patient location: Home Provider Location: [facility] [place] Participants: Lynnda Child and Lowella Petties Jeanmarie   Subjective:     Chad Huang is a 79 y.o. male presenting for Covid Positive (X 4 days ), Fever, and Nasal Congestion     HPI  #Covid positive - started feeling congestion 05/08/2020 - positive covid test on 05/09/2020 - did have a temperature of 100.9 - taking tylenol and ibuprofen - no difficulty breathing - drinking and eating well, drinking lots of water  Not vaccinated against covid-19  Biggest concern is the nasal congestion  Taking Delsym   Review of Systems  Constitutional: Positive for fever. Negative for chills.  HENT: Positive for congestion.   Respiratory: Positive for cough. Negative for shortness of breath.   Musculoskeletal: Positive for arthralgias and myalgias.         Social History   Tobacco Use  Smoking Status Former Smoker  . Packs/day: 1.00  . Years: 20.00  . Pack years: 20.00  . Types: Cigarettes  Smokeless Tobacco Current User  . Types: Chew  Tobacco Comment   quit in 1973        Objective:   BP Readings from Last 3 Encounters:  05/12/20 138/74  10/16/19 (!) 142/82  06/05/19 (!) 143/82   Wt Readings from Last 3 Encounters:  05/12/20 217 lb (98.4 kg)  10/16/19 219 lb 8 oz (99.6 kg)  10/12/18 219 lb 8 oz (99.6 kg)    BP 138/74   Pulse 67   Temp 98.3 F (36.8 C) (Oral)   Wt 217 lb (98.4 kg)   BMI 32.05 kg/m    Patient heard  of hearing but speaking clearly w/o distress Alert and oriented        Assessment & Plan:   Problem List Items Addressed This Visit   None   Visit Diagnoses    COVID-19 virus infection    -  Primary     Discussed high risk individual and due to not being vaccinated would recommend monoclonal antibody treatment.   He will check with insurance on cost and message sent to team to see if he eligible due to shortage  In the meantime recommend OTC treatment.   If congestion worsens - could consider abx for sinus infection  If breathing worsens - ER precautions but could consider CXR and pneumonia treatment  Interactive audio and video telecommunications were attempted between this provider and patient, however failed, due to patient having technical difficulties OR patient did not have access to video capability.  We continued and completed visit with audio only.   Start Time: 4:28 End Time: 4:42    Return if symptoms worsen or fail to improve.  Lynnda Child, MD

## 2020-05-12 NOTE — Telephone Encounter (Signed)
[facility] Day - Client TELEPHONE ADVICE RECORD [facility] Patient Name: Chad Huang Gender: Male DOB: 15-Jan-1941 Age: 79 Y 2 M 5 D Return Phone Number: [phone] (Primary), [phone] (Secondary) Address[place] [address] Client [facility] Day - Client Client Site [facility] - Day Physician Vernona Huang - NP Contact Type Call Who Is Calling Patient / Member / Family / Caregiver Call Type Triage / Clinical Caller Name [name] Relationship To Patient Daughter Return Phone Number [phone] (Primary) Chief Complaint Fever (non urgent symptom) (> THREE MONTHS) Reason for Call Symptomatic / Request for Health Information Initial Comment Caller states she is calling in about her dad. Caller states that her dad has a low grade fever 100.9 and sinus symptoms. Home COVID test show positive. Translation No Nurse Assessment Nurse: Sande Brothers, RN, Ginny Date/Time (Eastern Time): 05/12/2020 9:39:30 AM Confirm and document reason for call. If symptomatic, describe symptoms. ---Caller stating that her father has a low grade fever of 100.9 (oral) low grade fever and nasal congestion/sinus stuffed up. Stating that they did home COVID test show positive. Stating seven cases of covid at his church. Does the patient have any new or worsening symptoms? ---Yes Will a triage be completed? ---Yes Related visit to physician within the last 2 weeks? ---No Does the PT have any chronic conditions? (i.e. diabetes, asthma, this includes High risk factors for pregnancy, etc.) ---Yes List chronic conditions. ---HLD, HTN Is this a behavioral health or substance abuse call? ---No Guidelines Guideline Title Affirmed Question Affirmed Notes Nurse Date/Time (Eastern Time) COVID-19 - Diagnosed or Suspected HIGH RISK for severe COVID complications (e.g., age > 64 years, obesity with BMI >  25, pregnant, chronic lung disease or other chronic medical condition) (Exception: Already seen Sande Brothers, RN, Ginny 05/12/2020 9:43:00 AM PLEASE NOTE: All timestamps contained within this report are represented as [place] Standard Time. CONFIDENTIALTY NOTICE: This fax transmission is intended only for the addressee. It contains information that is legally privileged, confidential or otherwise protected from use or disclosure. If you are not the intended recipient, you are strictly prohibited from reviewing, disclosing, copying using or disseminating any of this information or taking any action in reliance on or regarding this information. If you have received this fax in error, please notify us immediately by telephone so that we can arrange for its return to [place]. Phone: [phone], Toll-Free: [phone], Fax: [phone] Page: 2 of 2 Call Id: [id number] Guidelines Guideline Title Affirmed Question Affirmed Notes Nurse Date/Time Lamount Cohen Time) by PCP and no new or worsening symptoms.) Disp. Time Lamount Cohen Time) Disposition Final User 05/12/2020 9:49:18 AM Call PCP Now Yes Sande Brothers, RN, Ginny Caller Disagree/Comply Comply Caller Understands Yes PreDisposition Call Doctor Care Advice Given Per Guideline CALL BACK IF: * You become worse CARE ADVICE given per COVID-19 - DIAGNOSED OR SUSPECTED (Adult) guideline. Comments User: [name], RN Date/Time Lamount Cohen Time): 05/12/2020 10:00:49 AM Able to reach office and spoke with triage. Patient set up for virtual visit at 4:20 today with Dr. Selena Batten. Phoned daughter and provided information with understanding verbalized. Referrals REFERRED TO PCP OFFICE

## 2020-05-12 NOTE — Telephone Encounter (Signed)
Please see note from today 

## 2020-05-12 NOTE — Telephone Encounter (Signed)
Per appt notes pt already has video visit scheduled with Dr Selena Batten 05/12/20 at 4:20.

## 2020-05-14 ENCOUNTER — Telehealth: Payer: Self-pay | Admitting: Family

## 2020-05-14 ENCOUNTER — Telehealth: Payer: Self-pay

## 2020-05-14 DIAGNOSIS — J0140 Acute pansinusitis, unspecified: Secondary | ICD-10-CM

## 2020-05-14 MED ORDER — AMOXICILLIN-POT CLAVULANATE 875-125 MG PO TABS: 1.0000 | ORAL_TABLET | Freq: Two times a day (BID) | ORAL | 0 refills | Status: AC

## 2020-05-14 NOTE — Telephone Encounter (Signed)
Spoke with daughter and gave her Dr. Elmyra Ricks recommendation on when to start the medication.

## 2020-05-14 NOTE — Telephone Encounter (Signed)
Medication sent to pharmacy. Though would recommend waiting until symptoms have persisted beyond 7 days. At this point congestion is likely related to the virus and antibiotics would not be helpful.

## 2020-05-14 NOTE — Telephone Encounter (Signed)
Received call from patient's daughter he was seen by Dr. Selena Batten on 12/27 and instructed that if no better by Thursday to call and would send in abx. Daughter is getting called out of town unexpectedly and will not be available to pick up prescriptions. She would like to see if we can call in today to [facility] so that she can pick up. She denies any change in his symptoms. Congestion has not changed at all and fever has not got over 100. They have not received call from infusion center to set up appointment yet.

## 2020-05-14 NOTE — Addendum Note (Signed)
Addended by: Lynnda Child on: 05/14/2020 09:54 AM   Modules accepted: Orders

## 2020-05-14 NOTE — Telephone Encounter (Signed)
Called to Discuss with patient about Covid symptoms and the use of the monoclonal antibody infusion for those with mild to moderate Covid symptoms and at a high risk of hospitalization.     Pt appears to qualify for this infusion due to co-morbid conditions and/or a member of an at-risk group in accordance with the FDA Emergency Use Authorization.   [name] had a telehealth visit with his PCP's office on 12/27. Symptom onset was 12/23. He is unvaccinated. Testing for COVID 19 was positive. Qualifying risk factors include hypertension, BMI >25, older age, hyperlipidemia and prostate cancer. Continues to have nasal congestion and low grade fevers in the morning.   I spoke with [name] daughter who is his Midwife. We discussed the risks, benefits and potential financial costs associated with treatment. [name] has declined treatment and wishes to continue with symptomatic treatment.   Marcos Eke, NP 05/14/2020 10:39 AM

## 2020-05-21 ENCOUNTER — Other Ambulatory Visit: Payer: Self-pay

## 2020-05-21 ENCOUNTER — Emergency Department (HOSPITAL_COMMUNITY)
Admission: EM | Admit: 2020-05-21 | Discharge: 2020-05-21 | Discharge status: Against Medical Advice | Payer: Medicare Other | Attending: Emergency Medicine | Admitting: Emergency Medicine

## 2020-05-21 ENCOUNTER — Encounter (HOSPITAL_COMMUNITY): Payer: Self-pay | Admitting: Emergency Medicine

## 2020-05-21 ENCOUNTER — Emergency Department (HOSPITAL_COMMUNITY): Payer: Medicare Other

## 2020-05-21 DIAGNOSIS — R059 Cough, unspecified: Secondary | ICD-10-CM | POA: Diagnosis not present

## 2020-05-21 DIAGNOSIS — Z79899 Other long term (current) drug therapy: Secondary | ICD-10-CM | POA: Insufficient documentation

## 2020-05-21 DIAGNOSIS — Z87891 Personal history of nicotine dependence: Secondary | ICD-10-CM | POA: Diagnosis not present

## 2020-05-21 DIAGNOSIS — Z85828 Personal history of other malignant neoplasm of skin: Secondary | ICD-10-CM | POA: Insufficient documentation

## 2020-05-21 DIAGNOSIS — K219 Gastro-esophageal reflux disease without esophagitis: Secondary | ICD-10-CM | POA: Diagnosis not present

## 2020-05-21 DIAGNOSIS — Z8546 Personal history of malignant neoplasm of prostate: Secondary | ICD-10-CM | POA: Diagnosis not present

## 2020-05-21 DIAGNOSIS — I10 Essential (primary) hypertension: Secondary | ICD-10-CM | POA: Diagnosis not present

## 2020-05-21 DIAGNOSIS — U071 COVID-19: Secondary | ICD-10-CM | POA: Diagnosis not present

## 2020-05-21 DIAGNOSIS — R109 Unspecified abdominal pain: Secondary | ICD-10-CM | POA: Diagnosis not present

## 2020-05-21 DIAGNOSIS — R0602 Shortness of breath: Secondary | ICD-10-CM | POA: Diagnosis not present

## 2020-05-21 DIAGNOSIS — Z7982 Long term (current) use of aspirin: Secondary | ICD-10-CM | POA: Insufficient documentation

## 2020-05-21 LAB — BASIC METABOLIC PANEL
Anion gap: 10 (ref 5–15)
BUN: 11 mg/dL (ref 8–23)
CO2: 26 mmol/L (ref 22–32)
Calcium: 8.5 mg/dL — ABNORMAL LOW (ref 8.9–10.3)
Chloride: 99 mmol/L (ref 98–111)
Creatinine, Ser: 1.16 mg/dL (ref 0.61–1.24)
GFR, Estimated: >60 mL/min (ref >60)
Glucose, Bld: 111 mg/dL — ABNORMAL HIGH (ref 70–99)
Potassium: 3.2 mmol/L — ABNORMAL LOW (ref 3.5–5.1)
Sodium: 135 mmol/L (ref 135–145)

## 2020-05-21 LAB — CBC
HCT: 41.2 % (ref 39.0–52.0)
Hemoglobin: 14 g/dL (ref 13.0–17.0)
MCH: 29 pg (ref 26.0–34.0)
MCHC: 34 g/dL (ref 30.0–36.0)
MCV: 85.5 fL (ref 80.0–100.0)
Platelets: 234 10*3/uL (ref 150–400)
RBC: 4.82 MIL/uL (ref 4.22–5.81)
RDW: 13.3 % (ref 11.5–15.5)
WBC: 6.2 10*3/uL (ref 4.0–10.5)
nRBC: 0 % (ref 0.0–0.2)

## 2020-05-21 LAB — TROPONIN I (HIGH SENSITIVITY)
Troponin I (High Sensitivity): 11 ng/L (ref <18)
Troponin I (High Sensitivity): 11 ng/L (ref <18)

## 2020-05-21 MED ORDER — THIAMINE HCL 100 MG PO TABS: 100.0000 mg | ORAL_TABLET | Freq: Once | ORAL | Status: DC

## 2020-05-21 MED ORDER — ONDANSETRON 4 MG PO TBDP: 4.0000 mg | ORAL_TABLET | Freq: Three times a day (TID) | ORAL | 0 refills | Status: DC | PRN

## 2020-05-21 MED ORDER — LORAZEPAM 2 MG/ML IJ SOLN: 0.0000 mg | Freq: Two times a day (BID) | INTRAMUSCULAR | Status: DC

## 2020-05-21 MED ORDER — ONDANSETRON 4 MG PO TBDP
4.0000 mg | ORAL_TABLET | Freq: Once | ORAL | Status: AC
  Administered 2020-05-21: 4 mg via ORAL
  Filled 2020-05-21: qty 1

## 2020-05-21 MED ORDER — LORAZEPAM 1 MG PO TABS: 0.0000 mg | ORAL_TABLET | Freq: Four times a day (QID) | ORAL | Status: DC

## 2020-05-21 MED ORDER — LORAZEPAM 1 MG PO TABS: 0.0000 mg | ORAL_TABLET | Freq: Two times a day (BID) | ORAL | Status: DC

## 2020-05-21 MED ORDER — LORAZEPAM 2 MG/ML IJ SOLN: 0.0000 mg | Freq: Four times a day (QID) | INTRAMUSCULAR | Status: DC

## 2020-05-21 MED ORDER — POTASSIUM CHLORIDE 20 MEQ PO PACK
40.0000 meq | PACK | Freq: Every day | ORAL | Status: DC
  Administered 2020-05-21: 40 meq via ORAL
  Filled 2020-05-21: qty 2

## 2020-05-21 NOTE — Discharge Instructions (Addendum)
Return at any time if you change your mind or have any other questions.  See your doctor in 1-2 days.

## 2020-05-21 NOTE — ED Triage Notes (Signed)
Patient was diagnosed COVID + by a home test on 12/23, patient feels SOB and 'like he is going to pass out.' PCP prescribed an abx for PNA but he is unable to keep it down D/T emesis. Endorses chest pain with a cough, did not get vaccinated against COVID-19.

## 2020-05-21 NOTE — ED Provider Notes (Signed)
[name] DEPT Provider Note   CSN: [id number] Arrival date & time: 05/21/20  1021     History Chief Complaint  Patient presents with  . Cough  . Shortness of Breath  . Covid Positive    Chad Huang is a 80 y.o. male.  Patient presents with history of Covid infection, persistent cough and now right flank pain.  He states that symptoms been ongoing for about 10 days now.  He spoke with his primary care doctor who sent him to the ER for patient.  Complaining of generalized malaise and vomiting as well.        Past Medical History:  Diagnosis Date  . Alcohol abuse   . Anxiety and depression    charter, admission  . Back pain    (Kritzer), microdiskectomy-great result  . Benign prostatic hypertrophy    per Dr Jeffie Pollock with uro  . Cataracts, bilateral   . Cerumen impaction    left  . Chest pain   . GERD (gastroesophageal reflux disease)   . Hearing loss   . Hiatal hernia    nodule  . HTN (hypertension)   . Hyperlipidemia, mixed   . Inguinal hernia    left, hx of, repair  . Internal hemorrhoids   . Left ear pain   . Muscle disorder    disorder, muscle/ligaminet nis, rt lower extremity  . Prostate cancer [facility]) 2010   Dr Jeffie Pollock  . Skin cancer    hx, left ear    Patient Active Problem List   Diagnosis Date Noted  . Nasal congestion 06/05/2019  . Chest discomfort 10/12/2018  . Preventative health care 09/26/2017  . Prostate cancer ([place]) 11/02/2010  . Hyperlipidemia 10/25/2008  . Essential hypertension 10/25/2008  . Hearing loss 03/13/2007  . Internal hemorrhoids 03/07/2007  . GERD 03/07/2007    Past Surgical History:  Procedure Laterality Date  . back microdiskectomy  10/01   Kritzer  . BACK SURGERY    . CARDIOVASCULAR STRESS TEST  2009   [place]  . COLONOSCOPY  5/05   polyps int hemorrhoids, repeat 10 yrs  . ESOPHAGOGASTRODUODENOSCOPY  03/02/00   gastritis, chronic  . ESOPHAGOGASTRODUODENOSCOPY  5/05   polyps in  antrum, neg H Pylori  . ETT  05/19/07   Adenosine Muoview  . H Pylori positive  8/98  . HERNIA REPAIR    . LAPAROSCOPIC INGUINAL HERNIA REPAIR  10/96  . levitin eval  02/05/99   hip pain  . LUMBAR SPINE SURGERY  5/00  . MRI  10/07/98   degen changes 9Dr. French Ana) steroid injections  . MYELOGRAM  9/01   stenosis L4-5       Family History  Problem Relation Age of Onset  . Prostate cancer Father   . Stroke Other        grandmother  . Lung cancer Sister   . Heart disease Brother     Social History   Tobacco Use  . Smoking status: Former Smoker    Packs/day: 1.00    Years: 20.00    Pack years: 20.00    Types: Cigarettes  . Smokeless tobacco: Current User    Types: Chew  . Tobacco comment: quit in 1973  Vaping Use  . Vaping Use: Never used  Substance Use Topics  . Alcohol use: No    Comment: none since 1996  . Drug use: No    Home Medications Prior to Admission medications   Medication Sig Start Date End Date  Taking? Authorizing Provider  amLODipine (NORVASC) 5 MG tablet TAKE 1 TABLET BY MOUTH EVERY DAY FOR BLOOD PRESSURE 02/21/20   Doreene Nest, NP  amoxicillin-clavulanate (AUGMENTIN) 875-125 MG tablet Take 1 tablet by mouth 2 (two) times daily for 7 days. 05/14/20 05/21/20  Lynnda Child, MD  Ascorbic Acid (VITAMIN C) 1000 MG tablet Take 1,000 mg by mouth daily.    [provider]  aspirin 81 MG tablet Take 81 mg by mouth daily.    [provider]  Dextromethorphan-guaiFENesin [facility] DM PO) Take by mouth as needed.    [provider]  fluticasone (FLONASE) 50 MCG/ACT nasal spray PLACE 1 SPRAY INTO BOTH NOSTRILS 2 TIMES DAILY AS NEEDED FOR ALLERGIES OR RHINITIS. 05/12/20   Doreene Nest, NP  pantoprazole (PROTONIX) 40 MG tablet TAKE 1 TABLET BY MOUTH  DAILY FOR HEARTBURN 05/12/20   Doreene Nest, NP  simvastatin (ZOCOR) 40 MG tablet TAKE 1 TABLET BY MOUTH EVERY DAY IN THE EVENING 11/15/19   Doreene Nest, NP  tamsulosin  (FLOMAX) 0.4 MG CAPS capsule Take 0.4 mg by mouth daily. 05/29/19   [provider]  Zinc 50 MG TABS Take by mouth daily.    [provider]    Allergies    Clarithromycin, Codeine phosphate, and Nabumetone  Review of Systems   Review of Systems  Constitutional: Negative for fever.  HENT: Negative for ear pain and sore throat.   Eyes: Negative for pain.  Respiratory: Positive for cough.   Cardiovascular: Negative for chest pain.  Gastrointestinal: Negative for abdominal pain.  Genitourinary: Positive for flank pain.  Musculoskeletal: Negative for back pain.  Skin: Negative for color change and rash.  Neurological: Negative for syncope.  All other systems reviewed and are negative.   Physical Exam Updated Vital Signs BP (!) 145/92   Pulse 79   Temp 98.2 F (36.8 C)   Resp 18   Ht 5\' 10"  (1.778 m)   Wt 93.9 kg   SpO2 97%   BMI 29.70 kg/m   Physical Exam Constitutional:      General: He is not in acute distress.    Appearance: He is well-developed.  HENT:     Head: Normocephalic.     Nose: Nose normal.  Eyes:     Extraocular Movements: Extraocular movements intact.  Cardiovascular:     Rate and Rhythm: Normal rate.  Pulmonary:     Effort: Pulmonary effort is normal.  Abdominal:     Tenderness: There is no abdominal tenderness. There is no guarding or rebound.  Skin:    Coloration: Skin is not jaundiced.  Neurological:     Mental Status: He is alert. Mental status is at baseline.     ED Results / Procedures / Treatments   Labs (all labs ordered are listed, but only abnormal results are displayed) Labs Reviewed  BASIC METABOLIC PANEL - Abnormal; Notable for the following components:      Result Value   Potassium 3.2 (*)    Glucose, Bld 111 (*)    Calcium 8.5 (*)    All other components within normal limits  CBC  TROPONIN I (HIGH SENSITIVITY)  TROPONIN I (HIGH SENSITIVITY)    EKG None  Radiology DG Chest 2 View  Result Date:  05/21/2020 CLINICAL DATA:  Cough.  COVID. EXAM: CHEST - 2 VIEW COMPARISON:  10/12/2018. FINDINGS: Heart size stable. Lung volumes. Prominent diffuse bilateral interstitial infiltrates. No pleural effusion or pneumothorax no acute bony abnormality.  IMPRESSION: Prominent diffuse bilateral pulmonary interstitial infiltrates consistent with pneumonitis. Electronically Signed   By: Marcello Moores  Register   On: 05/21/2020 11:41    Procedures Procedures (including critical care time)  Medications Ordered in ED Medications  potassium chloride (KLOR-CON) packet 40 mEq (40 mEq Oral Given 05/21/20 2031)  ondansetron (ZOFRAN-ODT) disintegrating tablet 4 mg (4 mg Oral Given 05/21/20 2030)    ED Course  I have reviewed the triage vital signs and the nursing notes.  Pertinent labs & imaging results that were available during my care of the patient were reviewed by me and considered in my medical decision making (see chart for details).    MDM Rules/Calculators/A&P                          Chest x-ray shows bilateral infiltrates consistent with pneumonitis.  Patient was given antibiotics by his primary care doctor but has been having some vomiting.  Given Zofran here tolerating p.o. trial.  I discussed with the patient my recommendation to pursue with CT scan given his flank pain of unclear etiology, perhaps due to his coughing and Covid illness or perhaps due to aortic etiology or other pathology.  Patient and daughter appear to have decision-making capacity.  Risk and benefits discussed with patient and his daughter.  They declined the study.  They will be leaving [place], advised immediate return if you change your mind have worsening symptoms or have any additional questions to return immediately to the ER, otherwise follow-up with your primary care doctor in 1 or 2 days.   Final Clinical Impression(s) / ED Diagnoses Final diagnoses:  [id number] virus infection  Flank pain    Rx / DC Orders ED  Discharge Orders    None       Luna Fuse, MD 05/21/20 2039

## 2020-05-22 ENCOUNTER — Telehealth: Payer: Self-pay | Admitting: *Deleted

## 2020-05-22 ENCOUNTER — Telehealth: Payer: Self-pay

## 2020-05-22 NOTE — Telephone Encounter (Signed)
Called patient to schedule appointment with PCP. Stated he is unable to do a virtual/ phone visit. Please advise as daughter stated no covid symptoms or positive. Stated retested. No test on file. Cough from lingering symptoms. Please advise.

## 2020-05-22 NOTE — Telephone Encounter (Signed)
Per chart review tab pt was at [facility] ED on 05/22/19. Sending note to Allayne Gitelman NP, and Alice at front desk.

## 2020-05-22 NOTE — Telephone Encounter (Signed)
TCT received from pt's daughter, daughter was stating patient is testing negative at home for COVID. She is upset because pt has problems with hearing and virtual visit is not the best. Prefers office visit. PCP will not see pt in the office due to COVID positive in providers notes. Pt was not tested at time of hospital visit. And pt would have preferred another type of testing for his back pain, he cannot do CT due to reactions to dye in past. Pt did not leave against medical advice, he just didn't want the CT scan. Patient/dtr was planning to speak to PCP about other options for testing. Noting pt leaving against medical advice may have issues with payment for his hospital visit. States they stayed the entire visit and  received his discharge paperwork and ED RN dc him. TOC CM sent message to ED provider and PCP to follow up.  Isidoro Donning RN CCM, [facility] ED TOC CM [phone]

## 2020-05-22 NOTE — Telephone Encounter (Signed)
Yes, we need to follow-up as he left AGAINST MEDICAL ADVICE during his emergency department visit. Okay to do virtual given cough symptoms.

## 2020-05-22 NOTE — Telephone Encounter (Signed)
[facility] Night - Client Nonclinical Telephone Record AccessNurse Client [facility] Night - Client Client Site [facility] - Night Physician [name] - NP Contact Type Call Who Is Calling Patient / Member / Family / Caregiver Caller Name [name] Caller Phone Number [phone] Patient Name Chad Huang Patient DOB Sep 02, 1940 Call Type Message Only Information Provided Reason for Call Request to Schedule Office Appointment Initial Comment Caller states she is in the ER with her father. She has been there since 200 AM. She wants to get an appointment tomorrow with his provider. Additional Comment Provided office hours. Disp. Time Disposition Final User 05/21/2020 7:06:20 PM General Information Provided Yes Young Berry Call Closed By: Young Berry Transaction Date/Time: 05/21/2020 7:02:51 PM (ET)

## 2020-05-23 NOTE — Telephone Encounter (Signed)
Noted, thanks for the update. Glad to know he's doing better.

## 2020-05-23 NOTE — Telephone Encounter (Signed)
Received message from daughter called back. States that she has called ED to change notes. He was not tested for covid. They left after refusing CT and no other options were provided for treatment. She did not sign AMA or anything and was given discharge ppw.    Does not feel need follow up at this time is feeling much better after given meds for nausea. Will contact office if any change in symptoms and would like to make app.

## 2020-06-10 ENCOUNTER — Encounter: Payer: Self-pay | Admitting: *Deleted

## 2020-06-12 ENCOUNTER — Other Ambulatory Visit: Payer: Self-pay

## 2020-06-12 ENCOUNTER — Ambulatory Visit (INDEPENDENT_AMBULATORY_CARE_PROVIDER_SITE_OTHER): Payer: Medicare Other | Admitting: Primary Care

## 2020-06-12 ENCOUNTER — Encounter: Payer: Self-pay | Admitting: Primary Care

## 2020-06-12 VITALS — BP 136/80 | HR 92 | Temp 96.8°F | Ht 70.0 in | Wt 218.0 lb

## 2020-06-12 DIAGNOSIS — R21 Rash and other nonspecific skin eruption: Secondary | ICD-10-CM | POA: Diagnosis not present

## 2020-06-12 MED ORDER — TRIAMCINOLONE ACETONIDE 0.5 % EX OINT: 1.0000 "application " | TOPICAL_OINTMENT | Freq: Two times a day (BID) | CUTANEOUS | 0 refills | Status: DC

## 2020-06-12 NOTE — Patient Instructions (Addendum)
Start using triamcinolone ointment 0.5%. apply twice daily to your legs for about 1-2 weeks.  Please update me if no resolve.  It was a pleasure to see you today!      You are due for Tdap but it is more cost effective to get from pharmacy

## 2020-06-12 NOTE — Progress Notes (Signed)
Subjective:    Patient ID: Chad Huang, male    DOB: [birth year], 80 y.o.   MRN: [id number]  HPI  This visit occurred during the SARS-CoV-2 public health emergency.  Safety protocols were in place, including screening questions prior to the visit, additional usage of staff PPE, and extensive cleaning of exam room while observing appropriate contact time as indicated for disinfecting solutions.   Chad Huang is a 80 year old male with a history of prostate cancer, hyperlipidemia, hypertension who presents today with a chief complaint of rash.  His rash is located to the right anterior lower extremity proximal to the ankle which began about 2 weeks ago. He's been treating his rash with "athletes foot cream" and cleaning with peroxide without improvement. His rash appears to be "spreading" up his lower extremity which is itchy, painful and burning. A few days ago he started to notice the same rash to his left lower extremity proximal to his ankle.   No new lotions, detergents, soaps or shampoos. No new medicines, vitamins, supplements. No new pets. No recent outdoor exposure or poison ivy exposure. No bonfire or smoke exposure.  No recent motel or hotel stay or new beds.   No fevers/chills, oral lesions, new joint pains, tick bites, abdominal pain, nausea.    Review of Systems  Constitutional: Negative for fever.  Skin: Positive for rash.       Past Medical History:  Diagnosis Date  . Alcohol abuse   . Anxiety and depression    charter, admission  . Back pain    (Kritzer), microdiskectomy-great result  . Benign prostatic hypertrophy    per Dr Jeffie Pollock with uro  . Cataracts, bilateral   . Cerumen impaction    left  . Chest pain   . GERD (gastroesophageal reflux disease)   . Hearing loss   . Hiatal hernia    nodule  . HTN (hypertension)   . Hyperlipidemia, mixed   . Inguinal hernia    left, hx of, repair  . Internal hemorrhoids   . Left ear pain   . Muscle disorder     disorder, muscle/ligaminet nis, rt lower extremity  . Prostate cancer [facility]) 2010   Dr Jeffie Pollock  . Skin cancer    hx, left ear     Social History   Socioeconomic History  . Marital status: Widowed    Spouse name: Not on file  . Number of children: Not on file  . Years of education: Not on file  . Highest education level: Not on file  Occupational History  . Not on file  Tobacco Use  . Smoking status: Former Smoker    Packs/day: 1.00    Years: 20.00    Pack years: 20.00    Types: Cigarettes  . Smokeless tobacco: Current User    Types: Chew  . Tobacco comment: quit in 1973  Vaping Use  . Vaping Use: Never used  Substance and Sexual Activity  . Alcohol use: No    Comment: none since 1996  . Drug use: No  . Sexual activity: Not Currently  Other Topics Concern  . Not on file  Social History Narrative   Married,    One daughter.    1 granddaughter.   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      [phone] (c?)   Social Determinants of Health   Financial Resource Strain: Low Risk   . Difficulty of Paying Living Expenses: Not hard at all  Food Insecurity: No Food Insecurity  . Worried About Charity fundraiser in the Last Year: Never true  . Ran Out of Food in the Last Year: Never true  Transportation Needs: No Transportation Needs  . Lack of Transportation (Medical): No  . Lack of Transportation (Non-Medical): No  Physical Activity: Sufficiently Active  . Days of Exercise per Week: 7 days  . Minutes of Exercise per Session: 30 min  Stress: No Stress Concern Present  . Feeling of Stress : Not at all  Social Connections: Not on file  Intimate Partner Violence: Not At Risk  . Fear of Current or Ex-Partner: No  . Emotionally Abused: No  . Physically Abused: No  . Sexually Abused: No    Past Surgical History:  Procedure Laterality Date  . back microdiskectomy  10/01   Kritzer  . BACK SURGERY    . CARDIOVASCULAR STRESS TEST  2009   [place]  . COLONOSCOPY   5/05   polyps int hemorrhoids, repeat 10 yrs  . ESOPHAGOGASTRODUODENOSCOPY  03/02/00   gastritis, chronic  . ESOPHAGOGASTRODUODENOSCOPY  5/05   polyps in antrum, neg H Pylori  . ETT  05/19/07   Adenosine Muoview  . H Pylori positive  8/98  . HERNIA REPAIR    . LAPAROSCOPIC INGUINAL HERNIA REPAIR  10/96  . levitin eval  02/05/99   hip pain  . LUMBAR SPINE SURGERY  5/00  . MRI  10/07/98   degen changes 9Dr. French Ana) steroid injections  . MYELOGRAM  9/01   stenosis L4-5    Family History  Problem Relation Age of Onset  . Prostate cancer Father   . Stroke Other        grandmother  . Lung cancer Sister   . Heart disease Brother     Allergies  Allergen Reactions  . Augmentin [Amoxicillin-Pot Clavulanate] Diarrhea  . Clarithromycin     REACTION: unspecified  . Codeine Phosphate     REACTION: nausea  . Nabumetone     REACTION: Muscle cramps    Current Outpatient Medications on File Prior to Visit  Medication Sig Dispense Refill  . amLODipine (NORVASC) 5 MG tablet TAKE 1 TABLET BY MOUTH EVERY DAY FOR BLOOD PRESSURE 90 tablet 1  . Ascorbic Acid (VITAMIN C) 1000 MG tablet Take 1,000 mg by mouth daily.    Marland Kitchen aspirin 81 MG tablet Take 81 mg by mouth daily.    Marland Kitchen Dextromethorphan-guaiFENesin ([place] DM PO) Take by mouth as needed.    . fluticasone (FLONASE) 50 MCG/ACT nasal spray PLACE 1 SPRAY INTO BOTH NOSTRILS 2 TIMES DAILY AS NEEDED FOR ALLERGIES OR RHINITIS. 48 mL 1  . ondansetron (ZOFRAN ODT) 4 MG disintegrating tablet Take 1 tablet (4 mg total) by mouth every 8 (eight) hours as needed for up to 8 doses for nausea or vomiting. 8 tablet 0  . pantoprazole (PROTONIX) 40 MG tablet TAKE 1 TABLET BY MOUTH  DAILY FOR HEARTBURN 90 tablet 1  . simvastatin (ZOCOR) 40 MG tablet TAKE 1 TABLET BY MOUTH EVERY DAY IN THE EVENING 90 tablet 2  . tamsulosin (FLOMAX) 0.4 MG CAPS capsule Take 0.4 mg by mouth daily.    . Zinc 50 MG TABS Take by mouth daily.     No current facility-administered  medications on file prior to visit.    BP 136/80   Pulse 92   Temp (!) 96.8 F (36 C) (Temporal)   Ht 5\' 10"  (1.778 m)   Wt 218 lb (98.9 kg)  SpO2 98%   BMI 31.28 kg/m    Objective:   Physical Exam Skin:    General: Skin is warm and dry.     Findings: Rash present.     Comments: Red patchy and spotty rash to right anterior lower extremity proximal to ankle. One solid circular red patch with scaling.   Few red patchy spots to left anterior lower extremity proximal to ankle.   No blistering or vesicles.   Neurological:     Mental Status: He is alert.  Psychiatric:        Mood and Affect: Mood normal.            Assessment & Plan:

## 2020-06-12 NOTE — Assessment & Plan Note (Signed)
Acute rash for 2 weeks, unilateral until a few days ago. Does not appear to be fungal, herpes zoster, poison ivy dermatitis.  Stop OTC antifungal. Start triamcinolone ointment BID. He will update.

## 2020-07-25 ENCOUNTER — Other Ambulatory Visit: Payer: Self-pay | Admitting: Primary Care

## 2020-07-25 DIAGNOSIS — I10 Essential (primary) hypertension: Secondary | ICD-10-CM

## 2020-07-25 NOTE — Telephone Encounter (Signed)
Patient needs office visit in June for further refills. Please schedule CPE

## 2020-07-25 NOTE — Telephone Encounter (Signed)
Called and scheduled cpe.

## 2020-08-07 ENCOUNTER — Other Ambulatory Visit: Payer: Self-pay | Admitting: Primary Care

## 2020-08-07 DIAGNOSIS — E785 Hyperlipidemia, unspecified: Secondary | ICD-10-CM

## 2020-10-07 ENCOUNTER — Other Ambulatory Visit: Payer: Self-pay | Admitting: Primary Care

## 2020-10-07 DIAGNOSIS — K219 Gastro-esophageal reflux disease without esophagitis: Secondary | ICD-10-CM

## 2020-10-10 ENCOUNTER — Other Ambulatory Visit: Payer: Self-pay | Admitting: Primary Care

## 2020-10-10 DIAGNOSIS — R21 Rash and other nonspecific skin eruption: Secondary | ICD-10-CM

## 2020-10-10 NOTE — Telephone Encounter (Signed)
Can we find out why he is requesting a refill of the triamcinolone ointment?  This was given during an acute visit for rash.

## 2020-10-15 ENCOUNTER — Other Ambulatory Visit: Payer: Self-pay

## 2020-10-15 ENCOUNTER — Ambulatory Visit (INDEPENDENT_AMBULATORY_CARE_PROVIDER_SITE_OTHER): Payer: Medicare Other

## 2020-10-15 DIAGNOSIS — Z0000 Encounter for general adult medical examination without abnormal findings: Secondary | ICD-10-CM

## 2020-10-15 NOTE — Telephone Encounter (Signed)
Called patient about spoke to daughter. Spots that were given for in past are back. They were scheduled for CPE this week but had to move do to your schedule change. Would like refill until appointment in July.

## 2020-10-15 NOTE — Progress Notes (Signed)
PCP notes:  Health Maintenance: Tdap- insurance shingrix- due covid- declined    Abnormal Screenings: none   Patient concerns: Nose sore, drainage, sneezing Red spots on both legs   Nurse concerns: none   Next PCP appt.: 11/19/2020 @ 10:20 am

## 2020-10-15 NOTE — Progress Notes (Signed)
Subjective:   Chad Huang is a 80 y.o. male who presents for Medicare Annual/Subsequent preventive examination.  Review of Systems: N/A      I connected with the patient today by telephone and verified that I am speaking with the correct person using two identifiers. Location patient: home Location nurse: work Persons participating in the telephone visit: patient, nurse.   I discussed the limitations, risks, security and privacy concerns of performing an evaluation and management service by telephone and the availability of in person appointments. I also discussed with the patient that there may be a patient responsible charge related to this service. The patient expressed understanding and verbally consented to this telephonic visit.       Cardiac Risk Factors include: advanced age (>9men, >48 women);male gender;hypertension;Other (see comment), Risk factor comments: hyperlipidemia     Objective:    Today's Vitals   There is no height or weight on file to calculate BMI.  Advanced Directives 10/15/2020 05/21/2020 10/12/2019 10/11/2018 09/26/2017 09/22/2016 06/23/2012  Does Patient Have a Medical Advance Directive? Yes No Yes Yes Yes Yes Patient has advance directive, copy in chart  Type of Advance Directive [place];Living will - [place];Living will [place];Living will [place];Living will [place];Living will [place];Living will  Copy of [place] in Chart? Yes - validated most recent copy scanned in chart (See row information) - Yes - validated most recent copy scanned in chart (See row information) No - copy requested No - copy requested Yes -  Pre-existing out of facility DNR order (yellow form or pink MOST form) - - - - - - No    Current Medications (verified) Outpatient Encounter Medications as of 10/15/2020  Medication Sig  . amLODipine  (NORVASC) 5 MG tablet TAKE 1 TABLET BY MOUTH  DAILY FOR BLOOD PRESSURE  . Ascorbic Acid (VITAMIN C) 1000 MG tablet Take 1,000 mg by mouth daily.  Marland Kitchen aspirin 81 MG tablet Take 81 mg by mouth daily.  Marland Kitchen Dextromethorphan-guaiFENesin ([place] DM PO) Take by mouth as needed.  . fluticasone (FLONASE) 50 MCG/ACT nasal spray PLACE 1 SPRAY INTO BOTH NOSTRILS 2 TIMES DAILY AS NEEDED FOR ALLERGIES OR RHINITIS.  Marland Kitchen ondansetron (ZOFRAN ODT) 4 MG disintegrating tablet Take 1 tablet (4 mg total) by mouth every 8 (eight) hours as needed for up to 8 doses for nausea or vomiting.  . pantoprazole (PROTONIX) 40 MG tablet TAKE 1 TABLET BY MOUTH  DAILY FOR HEARTBURN  . simvastatin (ZOCOR) 40 MG tablet Take 1 tablet (40 mg total) by mouth daily. For cholesterol.  . tamsulosin (FLOMAX) 0.4 MG CAPS capsule Take 0.4 mg by mouth daily.  Marland Kitchen triamcinolone ointment (KENALOG) 0.5 % Apply 1 application topically 2 (two) times daily.  . Zinc 50 MG TABS Take by mouth daily.   No facility-administered encounter medications on file as of 10/15/2020.    Allergies (verified) Augmentin [amoxicillin-pot clavulanate], Clarithromycin, Codeine phosphate, and Nabumetone   History: Past Medical History:  Diagnosis Date  . Alcohol abuse   . Anxiety and depression    charter, admission  . Back pain    ([place]), microdiskectomy-great result  . Benign prostatic hypertrophy    per Dr Jeffie Pollock with uro  . Cataracts, bilateral   . Cerumen impaction    left  . Chest pain   . GERD (gastroesophageal reflux disease)   . Hearing loss   . Hiatal hernia  nodule  . HTN (hypertension)   . Hyperlipidemia, mixed   . Inguinal hernia    left, hx of, repair  . Internal hemorrhoids   . Left ear pain   . Muscle disorder    disorder, muscle/ligaminet nis, rt lower extremity  . Prostate cancer [facility]) 2010   Dr Jeffie Pollock  . Skin cancer    hx, left ear   Past Surgical History:  Procedure Laterality Date  . back microdiskectomy  10/01   [place]   . BACK SURGERY    . CARDIOVASCULAR STRESS TEST  2009   [place]  . COLONOSCOPY  5/05   polyps int hemorrhoids, repeat 10 yrs  . ESOPHAGOGASTRODUODENOSCOPY  03/02/00   gastritis, chronic  . ESOPHAGOGASTRODUODENOSCOPY  5/05   polyps in antrum, neg H Pylori  . ETT  05/19/07   Adenosine Muoview  . H Pylori positive  8/98  . HERNIA REPAIR    . LAPAROSCOPIC INGUINAL HERNIA REPAIR  10/96  . levitin eval  02/05/99   hip pain  . LUMBAR SPINE SURGERY  5/00  . MRI  10/07/98   degen changes 9Dr. French Ana) steroid injections  . MYELOGRAM  9/01   stenosis L4-5   Family History  Problem Relation Age of Onset  . Prostate cancer Father   . Stroke Other        grandmother  . Lung cancer Sister   . Heart disease Brother    Social History   Socioeconomic History  . Marital status: Widowed    Spouse name: Not on file  . Number of children: Not on file  . Years of education: Not on file  . Highest education level: Not on file  Occupational History  . Not on file  Tobacco Use  . Smoking status: Former Smoker    Packs/day: 1.00    Years: 20.00    Pack years: 20.00    Types: Cigarettes  . Smokeless tobacco: Current User    Types: Chew  . Tobacco comment: quit in 1973  Vaping Use  . Vaping Use: Never used  Substance and Sexual Activity  . Alcohol use: No    Comment: none since 1996  . Drug use: No  . Sexual activity: Not Currently  Other Topics Concern  . Not on file  Social History Narrative   Married,    One daughter.    1 granddaughter.   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      [phone] (c?)   Social Determinants of Health   Financial Resource Strain: Low Risk   . Difficulty of Paying Living Expenses: Not hard at all  Food Insecurity: No Food Insecurity  . Worried About Charity fundraiser in the Last Year: Never true  . Ran Out of Food in the Last Year: Never true  Transportation Needs: No Transportation Needs  . Lack of Transportation (Medical): No  .  Lack of Transportation (Non-Medical): No  Physical Activity: Sufficiently Active  . Days of Exercise per Week: 7 days  . Minutes of Exercise per Session: 30 min  Stress: No Stress Concern Present  . Feeling of Stress : Not at all  Social Connections: Not on file    Tobacco Counseling Ready to quit: Not Answered Counseling given: Not Answered Comment: quit in 1973   Clinical Intake:  Pre-visit preparation completed: Yes  Pain : No/denies pain     Nutritional Risks: None Diabetes: No  How often do you need to have someone help  you when you read instructions, pamphlets, or other written materials from your doctor or pharmacy?: 1 - Never  Diabetic: No Nutrition Risk Assessment:  Has the patient had any N/V/D within the last 2 months?  No  Does the patient have any non-healing wounds?  No  Has the patient had any unintentional weight loss or weight gain?  No   Diabetes:  Is the patient diabetic?  No  If diabetic, was a CBG obtained today?  N/A Did the patient bring in their glucometer from home?  N/A How often do you monitor your CBG's? N/A.   Financial Strains and Diabetes Management:  Are you having any financial strains with the device, your supplies or your medication? N/A.  Does the patient want to be seen by Chronic Care Management for management of their diabetes?  N/A Would the patient like to be referred to a Nutritionist or for Diabetic Management?  N/A  Interpreter Needed?: No  Information entered by :: CJohnson, LPN   Activities of Daily Living In your present state of health, do you have any difficulty performing the following activities: 10/15/2020  Hearing? Y  Comment some hearing issues noted  Vision? N  Difficulty concentrating or making decisions? N  Walking or climbing stairs? N  Dressing or bathing? N  Doing errands, shopping? N  Preparing Food and eating ? N  Using the Toilet? N  In the past six months, have you accidently leaked urine? N   Do you have problems with loss of bowel control? N  Managing your Medications? N  Managing your Finances? N  Housekeeping or managing your Housekeeping? N  Some recent data might be hidden    Patient Care Team: [name], NP as PCP - General (Internal Medicine) Thelma Comp, [place] as Consulting Physician (Optometry) Ladene Artist, MD as Consulting Physician (Gastroenterology) Rozetta Nunnery, MD as Consulting Physician (Otolaryngology) Irine Seal, MD as Attending Physician (Urology) Karie Chimera, MD as Referring Physician (Neurosurgery)  Indicate any recent Medical Services you may have received from other than [facility] providers in the past year (date may be approximate).     Assessment:   This is a routine wellness examination for [place].  Hearing/Vision screen  Hearing Screening   125Hz  250Hz  500Hz  1000Hz  2000Hz  3000Hz  4000Hz  6000Hz  8000Hz   Right ear:           Left ear:           Vision Screening Comments: Patient gets annual eye exams   Dietary issues and exercise activities discussed: Current Exercise Habits: Home exercise routine, Type of exercise: walking, Time (Minutes): 30, Frequency (Times/Week): 7, Weekly Exercise (Minutes/Week): 210, Intensity: Moderate, Exercise limited by: None identified  Goals Addressed            This Visit's Progress   . Patient Stated       10/15/2020, I will continue to walk daily for 30 minutes       Depression Screen PHQ 2/9 Scores 10/15/2020 10/12/2019 10/11/2018 09/26/2017 09/22/2016  PHQ - 2 Score 0 0 0 0 0  PHQ- 9 Score 0 0 0 0 -    Fall Risk Fall Risk  10/15/2020 10/12/2019 10/11/2018 09/26/2017 09/22/2016  Falls in the past year? 0 0 0 No No  Number falls in past yr: 0 0 - - -  Injury with Fall? 0 0 - - -  Risk for fall due to : Medication side effect Medication side effect - - -  Follow up Falls  evaluation completed;Falls prevention discussed Falls evaluation completed;Falls prevention discussed - - -    FALL  RISK PREVENTION PERTAINING TO THE HOME:  Any stairs in or around the home? Yes  If so, are there any without handrails? No  Home free of loose throw rugs in walkways, pet beds, electrical cords, etc? Yes  Adequate lighting in your home to reduce risk of falls? Yes   ASSISTIVE DEVICES UTILIZED TO PREVENT FALLS:  Life alert? No  Use of a cane, walker or w/c? No  Grab bars in the bathroom? No  Shower chair or bench in shower? No  Elevated toilet seat or a handicapped toilet? No   TIMED UP AND GO:  Was the test performed? N/A telephone visit .   Cognitive Function: MMSE - Mini Mental State Exam 10/15/2020 10/12/2019 10/11/2018 09/26/2017 09/22/2016  Not completed: Refused - - - -  Orientation to time - 5 5 5 5   Orientation to Place - 5 5 5 5   Registration - 3 3 3 3   Attention/ Calculation - 5 0 0 5  Recall - 3 3 3 3   Language- name 2 objects - - 0 0 0  Language- repeat - 1 1 1 1   Language- follow 3 step command - - 0 3 3  Language- read & follow direction - - 0 0 0  Write a sentence - - 0 0 0  Copy design - - 0 0 0  Total score - - 17 20 25   Mini Cog  Mini-Cog screen was not completed. Patient refused. Maximum score is 22. A value of 0 denotes this part of the MMSE was not completed or the patient failed this part of the Mini-Cog screening.       Immunizations Immunization History  Administered Date(s) Administered  . Influenza-Unspecified 05/31/2016  . Pneumococcal Conjugate-13 09/22/2016  . Pneumococcal Polysaccharide-23 05/17/2008  . Td 06/17/1993, 09/18/2008    TDAP status: Due, Education has been provided regarding the importance of this vaccine. Advised may receive this vaccine at local pharmacy or Health Dept. Aware to provide a copy of the vaccination record if obtained from local pharmacy or Health Dept. Verbalized acceptance and understanding.  Flu Vaccine status: due Fall 2022  Pneumococcal vaccine status: Up to date  Covid-19 vaccine status: Declined,  Education has been provided regarding the importance of this vaccine but patient still declined. Advised may receive this vaccine at local pharmacy or Health Dept.or vaccine clinic. Aware to provide a copy of the vaccination record if obtained from local pharmacy or Health Dept. Verbalized acceptance and understanding.  Qualifies for Shingles Vaccine? Yes   Zostavax completed No   Shingrix Completed?: No.    Education has been provided regarding the importance of this vaccine. Patient has been advised to call insurance company to determine out of pocket expense if they have not yet received this vaccine. Advised may also receive vaccine at local pharmacy or Health Dept. Verbalized acceptance and understanding.  Screening Tests Health Maintenance  Topic Date Due  . Hepatitis C Screening  Never done  . Zoster Vaccines- Shingrix (1 of 2) Never done  . COVID-19 Vaccine (1) 10/31/2021 (Originally 03/07/1953)  . TETANUS/TDAP  10/16/2023 (Originally 09/19/2018)  . INFLUENZA VACCINE  12/15/2020  . PNA vac Low Risk Adult  Completed  . HPV VACCINES  Aged Out    Health Maintenance  Health Maintenance Due  Topic Date Due  . Hepatitis C Screening  Never done  . Zoster Vaccines- Shingrix (1 of  2) Never done    Colorectal cancer screening: No longer required.   Lung Cancer Screening: (Low Dose CT Chest recommended if Age 50-80 years, 30 pack-year currently smoking OR have quit w/in 15years.) does not qualify.   Additional Screening:  Hepatitis C Screening: does qualify; Completed: due  Vision Screening: Recommended annual ophthalmology exams for early detection of glaucoma and other disorders of the eye. Is the patient up to date with their annual eye exam?  Yes  Who is the provider or what is the name of the office in which the patient attends annual eye exams? [facility] If pt is not established with a provider, would they like to be referred to a provider to establish care? No .    Dental Screening: Recommended annual dental exams for proper oral hygiene  Community Resource Referral / Chronic Care Management: CRR required this visit?  No   CCM required this visit?  No      Plan:     I have personally reviewed and noted the following in the patient's chart:   . Medical and social history . Use of alcohol, tobacco or illicit drugs  . Current medications and supplements including opioid prescriptions. Patient is not currently taking opioid prescriptions. . Functional ability and status . Nutritional status . Physical activity . Advanced directives . List of other physicians . Hospitalizations, surgeries, and ER visits in previous 12 months . Vitals . Screenings to include cognitive, depression, and falls . Referrals and appointments  In addition, I have reviewed and discussed with patient certain preventive protocols, quality metrics, and best practice recommendations. A written personalized care plan for preventive services as well as general preventive health recommendations were provided to patient.   Due to this being a telephonic visit, the after visit summary with patients personalized plan was offered to patient via office or my-chart. Patient preferred to pick up at office at next visit or via mychart.   Andrez Grime, LPN   0/11/1217

## 2020-10-15 NOTE — Patient Instructions (Signed)
Chad Huang , Thank you for taking time to come for your Medicare Wellness Visit. I appreciate your ongoing commitment to your health goals. Please review the following plan we discussed and let me know if I can assist you in the future.  Screening recommendations/referrals: Colonoscopy: no longer required  Recommended yearly ophthalmology/optometry visit for glaucoma screening and checkup Recommended yearly dental visit for hygiene and checkup  Vaccinations: Influenza vaccine: due Fall 2022 Pneumococcal vaccine: Completed series Tdap vaccine: decline-insurance  Shingles vaccine: due, check with your insurance regarding coverage if interested    Covid-19: declined   Advanced directives: copy in chart  Conditions/risks identified: hypertension, hyperlipidemia   Next appointment: Follow up in one year for your annual wellness visit.   Preventive Care 32 Years and Older, Male Preventive care refers to lifestyle choices and visits with your health care provider that can promote health and wellness. What does preventive care include?  A yearly physical exam. This is also called an annual well check.  Dental exams once or twice a year.  Routine eye exams. Ask your health care provider how often you should have your eyes checked.  Personal lifestyle choices, including:  Daily care of your teeth and gums.  Regular physical activity.  Eating a healthy diet.  Avoiding tobacco and drug use.  Limiting alcohol use.  Practicing safe sex.  Taking low doses of aspirin every day.  Taking vitamin and mineral supplements as recommended by your health care provider. What happens during an annual well check? The services and screenings done by your health care provider during your annual well check will depend on your age, overall health, lifestyle risk factors, and family history of disease. Counseling  Your health care provider may ask you questions about your:  Alcohol use.  Tobacco  use.  Drug use.  Emotional well-being.  Home and relationship well-being.  Sexual activity.  Eating habits.  History of falls.  Memory and ability to understand (cognition).  Work and work Statistician. Screening  You may have the following tests or measurements:  Height, weight, and BMI.  Blood pressure.  Lipid and cholesterol levels. These may be checked every 5 years, or more frequently if you are over 58 years old.  Skin check.  Lung cancer screening. You may have this screening every year starting at age 63 if you have a 30-pack-year history of smoking and currently smoke or have quit within the past 15 years.  Fecal occult blood test (FOBT) of the stool. You may have this test every year starting at age 54.  Flexible sigmoidoscopy or colonoscopy. You may have a sigmoidoscopy every 5 years or a colonoscopy every 10 years starting at age 52.  Prostate cancer screening. Recommendations will vary depending on your family history and other risks.  Hepatitis C blood test.  Hepatitis B blood test.  Sexually transmitted disease (STD) testing.  Diabetes screening. This is done by checking your blood sugar (glucose) after you have not eaten for a while (fasting). You may have this done every 1-3 years.  Abdominal aortic aneurysm (AAA) screening. You may need this if you are a current or former smoker.  Osteoporosis. You may be screened starting at age 63 if you are at high risk. Talk with your health care provider about your test results, treatment options, and if necessary, the need for more tests. Vaccines  Your health care provider may recommend certain vaccines, such as:  Influenza vaccine. This is recommended every year.  Tetanus, diphtheria, and  acellular pertussis (Tdap, Td) vaccine. You may need a Td booster every 10 years.  Zoster vaccine. You may need this after age 6.  Pneumococcal 13-valent conjugate (PCV13) vaccine. One dose is recommended after age  26.  Pneumococcal polysaccharide (PPSV23) vaccine. One dose is recommended after age 52. Talk to your health care provider about which screenings and vaccines you need and how often you need them. This information is not intended to replace advice given to you by your health care provider. Make sure you discuss any questions you have with your health care provider. Document Released: 05/30/2015 Document Revised: 01/21/2016 Document Reviewed: 03/04/2015 Elsevier Interactive Patient Education  2017 [place] Prevention in the Home Falls can cause injuries. They can happen to people of all ages. There are many things you can do to make your home safe and to help prevent falls. What can I do on the outside of my home?  Regularly fix the edges of walkways and driveways and fix any cracks.  Remove anything that might make you trip as you walk through a door, such as a raised step or threshold.  Trim any bushes or trees on the path to your home.  Use bright outdoor lighting.  Clear any walking paths of anything that might make someone trip, such as rocks or tools.  Regularly check to see if handrails are loose or broken. Make sure that both sides of any steps have handrails.  Any raised decks and porches should have guardrails on the edges.  Have any leaves, snow, or ice cleared regularly.  Use sand or salt on walking paths during winter.  Clean up any spills in your garage right away. This includes oil or grease spills. What can I do in the bathroom?  Use night lights.  Install grab bars by the toilet and in the tub and shower. Do not use towel bars as grab bars.  Use non-skid mats or decals in the tub or shower.  If you need to sit down in the shower, use a plastic, non-slip stool.  Keep the floor dry. Clean up any water that spills on the floor as soon as it happens.  Remove soap buildup in the tub or shower regularly.  Attach bath mats securely with double-sided  non-slip rug tape.  Do not have throw rugs and other things on the floor that can make you trip. What can I do in the bedroom?  Use night lights.  Make sure that you have a light by your bed that is easy to reach.  Do not use any sheets or blankets that are too big for your bed. They should not hang down onto the floor.  Have a firm chair that has side arms. You can use this for support while you get dressed.  Do not have throw rugs and other things on the floor that can make you trip. What can I do in the kitchen?  Clean up any spills right away.  Avoid walking on wet floors.  Keep items that you use a lot in easy-to-reach places.  If you need to reach something above you, use a strong step stool that has a grab bar.  Keep electrical cords out of the way.  Do not use floor polish or wax that makes floors slippery. If you must use wax, use non-skid floor wax.  Do not have throw rugs and other things on the floor that can make you trip. What can I do with my stairs?  Do not leave any items on the stairs.  Make sure that there are handrails on both sides of the stairs and use them. Fix handrails that are broken or loose. Make sure that handrails are as long as the stairways.  Check any carpeting to make sure that it is firmly attached to the stairs. Fix any carpet that is loose or worn.  Avoid having throw rugs at the top or bottom of the stairs. If you do have throw rugs, attach them to the floor with carpet tape.  Make sure that you have a light switch at the top of the stairs and the bottom of the stairs. If you do not have them, ask someone to add them for you. What else can I do to help prevent falls?  Wear shoes that:  Do not have high heels.  Have rubber bottoms.  Are comfortable and fit you well.  Are closed at the toe. Do not wear sandals.  If you use a stepladder:  Make sure that it is fully opened. Do not climb a closed stepladder.  Make sure that both  sides of the stepladder are locked into place.  Ask someone to hold it for you, if possible.  Clearly mark and make sure that you can see:  Any grab bars or handrails.  First and last steps.  Where the edge of each step is.  Use tools that help you move around (mobility aids) if they are needed. These include:  Canes.  Walkers.  Scooters.  Crutches.  Turn on the lights when you go into a dark area. Replace any light bulbs as soon as they burn out.  Set up your furniture so you have a clear path. Avoid moving your furniture around.  If any of your floors are uneven, fix them.  If there are any pets around you, be aware of where they are.  Review your medicines with your doctor. Some medicines can make you feel dizzy. This can increase your chance of falling. Ask your doctor what other things that you can do to help prevent falls. This information is not intended to replace advice given to you by your health care provider. Make sure you discuss any questions you have with your health care provider. Document Released: 02/27/2009 Document Revised: 10/09/2015 Document Reviewed: 06/07/2014 Elsevier Interactive Patient Education  2017 [employer].

## 2020-10-16 ENCOUNTER — Encounter: Payer: Medicare Other | Admitting: Primary Care

## 2020-10-16 NOTE — Telephone Encounter (Signed)
Noted, refill sent to pharmacy. 

## 2020-10-30 ENCOUNTER — Other Ambulatory Visit: Payer: Self-pay | Admitting: Primary Care

## 2020-10-30 DIAGNOSIS — I10 Essential (primary) hypertension: Secondary | ICD-10-CM

## 2020-11-02 ENCOUNTER — Other Ambulatory Visit: Payer: Self-pay | Admitting: Primary Care

## 2020-11-02 DIAGNOSIS — E785 Hyperlipidemia, unspecified: Secondary | ICD-10-CM

## 2020-11-06 ENCOUNTER — Other Ambulatory Visit: Payer: Self-pay | Admitting: Primary Care

## 2020-11-06 DIAGNOSIS — R0981 Nasal congestion: Secondary | ICD-10-CM

## 2020-11-19 ENCOUNTER — Other Ambulatory Visit: Payer: Self-pay

## 2020-11-19 ENCOUNTER — Encounter: Payer: Self-pay | Admitting: Primary Care

## 2020-11-19 ENCOUNTER — Ambulatory Visit (INDEPENDENT_AMBULATORY_CARE_PROVIDER_SITE_OTHER): Payer: Medicare Other | Admitting: Primary Care

## 2020-11-19 VITALS — BP 126/80 | HR 84 | Temp 97.8°F | Ht 70.0 in | Wt 223.0 lb

## 2020-11-19 DIAGNOSIS — C61 Malignant neoplasm of prostate: Secondary | ICD-10-CM | POA: Diagnosis not present

## 2020-11-19 DIAGNOSIS — I10 Essential (primary) hypertension: Secondary | ICD-10-CM

## 2020-11-19 DIAGNOSIS — Z0000 Encounter for general adult medical examination without abnormal findings: Secondary | ICD-10-CM

## 2020-11-19 DIAGNOSIS — R21 Rash and other nonspecific skin eruption: Secondary | ICD-10-CM | POA: Diagnosis not present

## 2020-11-19 DIAGNOSIS — E782 Mixed hyperlipidemia: Secondary | ICD-10-CM | POA: Diagnosis not present

## 2020-11-19 DIAGNOSIS — Z125 Encounter for screening for malignant neoplasm of prostate: Secondary | ICD-10-CM

## 2020-11-19 DIAGNOSIS — K219 Gastro-esophageal reflux disease without esophagitis: Secondary | ICD-10-CM

## 2020-11-19 LAB — CBC
HCT: 42.7 % (ref 39.0–52.0)
Hemoglobin: 14.7 g/dL (ref 13.0–17.0)
MCHC: 34.5 g/dL (ref 30.0–36.0)
MCV: 85.4 fl (ref 78.0–100.0)
Platelets: 169 10*3/uL (ref 150.0–400.0)
RBC: 5 Mil/uL (ref 4.22–5.81)
RDW: 13.7 % (ref 11.5–15.5)
WBC: 7.1 10*3/uL (ref 4.0–10.5)

## 2020-11-19 LAB — LIPID PANEL
Cholesterol: 124 mg/dL (ref 0–200)
HDL: 31.3 mg/dL — ABNORMAL LOW (ref >39.00)
LDL Cholesterol: 63 mg/dL (ref 0–99)
NonHDL: 92.55
Total CHOL/HDL Ratio: 4
Triglycerides: 149 mg/dL (ref 0.0–149.0)
VLDL: 29.8 mg/dL (ref 0.0–40.0)

## 2020-11-19 LAB — COMPREHENSIVE METABOLIC PANEL
ALT: 9 U/L (ref 0–53)
AST: 13 U/L (ref 0–37)
Albumin: 4.2 g/dL (ref 3.5–5.2)
Alkaline Phosphatase: 77 U/L (ref 39–117)
BUN: 10 mg/dL (ref 6–23)
CO2: 28 mEq/L (ref 19–32)
Calcium: 8.9 mg/dL (ref 8.4–10.5)
Chloride: 106 mEq/L (ref 96–112)
Creatinine, Ser: 1.29 mg/dL (ref 0.40–1.50)
GFR: 52.66 mL/min — ABNORMAL LOW (ref >60.00)
Glucose, Bld: 94 mg/dL (ref 70–99)
Potassium: 3.8 mEq/L (ref 3.5–5.1)
Sodium: 141 mEq/L (ref 135–145)
Total Bilirubin: 0.9 mg/dL (ref 0.2–1.2)
Total Protein: 6 g/dL (ref 6.0–8.3)

## 2020-11-19 LAB — HEMOGLOBIN A1C: Hgb A1c MFr Bld: 5.9 % (ref 4.6–6.5)

## 2020-11-19 LAB — PSA, MEDICARE: PSA: 8.67 ng/ml — ABNORMAL HIGH (ref 0.10–4.00)

## 2020-11-19 MED ORDER — PANTOPRAZOLE SODIUM 40 MG PO TBEC: DELAYED_RELEASE_TABLET | ORAL | 3 refills | Status: DC

## 2020-11-19 NOTE — Progress Notes (Signed)
Subjective:    Patient ID: Chad Huang, male    DOB: 14-Apr-1941, 80 y.o.   MRN: [id number]  HPI  Chad Huang is a very pleasant 80 y.o. male who presents today for complete physical.  Immunizations: -Tetanus: 2010 -Covid-19: Has not completed -Shingles: Never completed, declines  -Pneumonia: Prevnar 13 in 2018, Pneumovax 2010   Diet: [place].  Exercise: No regular exercise.  Eye exam: Completes annually  Dental exam: Completes semi-annually   Colonoscopy: Completed in 2005, declines given age PSA: Due  BP Readings from Last 3 Encounters:  11/19/20 126/80  06/12/20 136/80  05/21/20 (!) 145/92         Review of Systems  Constitutional:  Negative for unexpected weight change.  HENT:  Negative for rhinorrhea.   Respiratory:  Negative for cough and shortness of breath.   Cardiovascular:  Negative for chest pain.  Gastrointestinal:  Negative for constipation and diarrhea.  Genitourinary:  Negative for difficulty urinating.  Musculoskeletal:  Negative for arthralgias and myalgias.  Skin:  Negative for rash.  Allergic/Immunologic: Negative for environmental allergies.  Neurological:  Negative for dizziness and headaches.  Psychiatric/Behavioral:  The patient is not nervous/anxious.         Past Medical History:  Diagnosis Date   Alcohol abuse    Anxiety and depression    charter, admission   Back pain    ([place]), microdiskectomy-great result   Benign prostatic hypertrophy    per Dr Jeffie Pollock with uro   Cataracts, bilateral    Cerumen impaction    left   Chest pain    GERD (gastroesophageal reflux disease)    Hearing loss    Hiatal hernia    nodule   HTN (hypertension)    Hyperlipidemia, mixed    Inguinal hernia    left, hx of, repair   Internal hemorrhoids    Left ear pain    Muscle disorder    disorder, muscle/ligaminet nis, rt lower extremity   Prostate cancer ([place]) 2010   Dr Jeffie Pollock   Skin cancer    hx, left ear    Social History    Socioeconomic History   Marital status: Widowed    Spouse name: Not on file   Number of children: Not on file   Years of education: Not on file   Highest education level: Not on file  Occupational History   Not on file  Tobacco Use   Smoking status: Former    Packs/day: 1.00    Years: 20.00    Pack years: 20.00    Types: Cigarettes   Smokeless tobacco: Current    Types: Chew   Tobacco comments:    quit in 1973  Vaping Use   Vaping Use: Never used  Substance and Sexual Activity   Alcohol use: No    Comment: none since 1996   Drug use: No   Sexual activity: Not Currently  Other Topics Concern   Not on file  Social History Narrative   Married,    One daughter.    1 granddaughter.   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      [phone] (c?)   Social Determinants of Health   Financial Resource Strain: Low Risk    Difficulty of Paying Living Expenses: Not hard at all  Food Insecurity: No Food Insecurity   Worried About Charity fundraiser in the Last Year: Never true   [place] in the Last Year: Never  true  Transportation Needs: No Transportation Needs   Lack of Transportation (Medical): No   Lack of Transportation (Non-Medical): No  Physical Activity: Sufficiently Active   Days of Exercise per Week: 7 days   Minutes of Exercise per Session: 30 min  Stress: No Stress Concern Present   Feeling of Stress : Not at all  Social Connections: Not on file  Intimate Partner Violence: Not At Risk   Fear of Current or Ex-Partner: No   Emotionally Abused: No   Physically Abused: No   Sexually Abused: No    Past Surgical History:  Procedure Laterality Date   back microdiskectomy  10/01   [facility]   BACK SURGERY     CARDIOVASCULAR STRESS TEST  2009   [place]   COLONOSCOPY  5/05   polyps int hemorrhoids, repeat 10 yrs   ESOPHAGOGASTRODUODENOSCOPY  03/02/00   gastritis, chronic   ESOPHAGOGASTRODUODENOSCOPY  5/05   polyps in antrum, neg H Pylori    ETT  05/19/07   Adenosine Muoview   H Pylori positive  8/98   HERNIA REPAIR     LAPAROSCOPIC INGUINAL HERNIA REPAIR  10/96   levitin eval  02/05/99   hip pain   LUMBAR SPINE SURGERY  5/00   MRI  10/07/98   degen changes 9Dr. French Ana) steroid injections   MYELOGRAM  9/01   stenosis L4-5    Family History  Problem Relation Age of Onset   Prostate cancer Father    Stroke Other        grandmother   Lung cancer Sister    Heart disease Brother     Allergies  Allergen Reactions   Augmentin [Amoxicillin-Pot Clavulanate] Diarrhea   Clarithromycin     REACTION: unspecified   Codeine Phosphate     REACTION: nausea   Nabumetone     REACTION: Muscle cramps    Current Outpatient Medications on File Prior to Visit  Medication Sig Dispense Refill   amLODipine (NORVASC) 5 MG tablet TAKE 1 TABLET BY MOUTH EVERY DAY FOR BLOOD PRESSURE 30 tablet 0   Ascorbic Acid (VITAMIN C) 1000 MG tablet Take 1,000 mg by mouth daily.     aspirin 81 MG tablet Take 81 mg by mouth daily.     Dextromethorphan-guaiFENesin ([place] DM PO) Take by mouth as needed.     fluticasone (FLONASE) 50 MCG/ACT nasal spray PLACE 1 SPRAY INTO BOTH NOSTRILS 2 TIMES DAILY AS NEEDED FOR ALLERGIES OR RHINITIS. 48 mL 0   ondansetron (ZOFRAN ODT) 4 MG disintegrating tablet Take 1 tablet (4 mg total) by mouth every 8 (eight) hours as needed for up to 8 doses for nausea or vomiting. 8 tablet 0   pantoprazole (PROTONIX) 40 MG tablet TAKE 1 TABLET BY MOUTH  DAILY FOR HEARTBURN 30 tablet 0   simvastatin (ZOCOR) 40 MG tablet TAKE 1 TABLET BY MOUTH EVERY DAY FOR CHOLESTEROL 90 tablet 0   tamsulosin (FLOMAX) 0.4 MG CAPS capsule Take 0.4 mg by mouth daily.     triamcinolone ointment (KENALOG) 0.5 % APPLY TO AFFECTED AREA TWICE A DAY 30 g 0   Zinc 50 MG TABS Take by mouth daily.     No current facility-administered medications on file prior to visit.    BP 126/80   Pulse 84   Temp 97.8 F (36.6 C) (Temporal)   Ht 5\' 10"  (1.778 m)    Wt 223 lb (101.2 kg)   SpO2 96%   BMI 32.00 kg/m  Objective:   Physical Exam  HENT:     Right Ear: Tympanic membrane and ear canal normal.     Left Ear: Tympanic membrane and ear canal normal.     Nose: Nose normal.     Right Sinus: No maxillary sinus tenderness or frontal sinus tenderness.     Left Sinus: No maxillary sinus tenderness or frontal sinus tenderness.  Eyes:     Conjunctiva/sclera: Conjunctivae normal.  Neck:     Thyroid: No thyromegaly.     Vascular: No carotid bruit.  Cardiovascular:     Rate and Rhythm: Normal rate and regular rhythm.     Heart sounds: Normal heart sounds.  Pulmonary:     Effort: Pulmonary effort is normal.     Breath sounds: Normal breath sounds. No wheezing or rales.  Abdominal:     General: Bowel sounds are normal.     Palpations: Abdomen is soft.     Tenderness: There is no abdominal tenderness.  Musculoskeletal:        General: Normal range of motion.     Cervical back: Neck supple.  Skin:    General: Skin is warm and dry.  Neurological:     Mental Status: He is alert and oriented to person, place, and time.     Cranial Nerves: No cranial nerve deficit.     Deep Tendon Reflexes: Reflexes are normal and symmetric.  Psychiatric:        Mood and Affect: Mood normal.          Assessment & Plan:      This visit occurred during the SARS-CoV-2 public health emergency.  Safety protocols were in place, including screening questions prior to the visit, additional usage of staff PPE, and extensive cleaning of exam room while observing appropriate contact time as indicated for disinfecting solutions.

## 2020-11-19 NOTE — Assessment & Plan Note (Addendum)
Repeat PSA pending today. No recent follow up with Urology.   Continue tamsulosin 0.4 mg.

## 2020-11-19 NOTE — Assessment & Plan Note (Signed)
Compliant to simvastatin 40 mg daily.  Repeat lipids pending.

## 2020-11-19 NOTE — Assessment & Plan Note (Signed)
Well controlled in the office today, continue amlodipine 5 mg daily.

## 2020-11-19 NOTE — Assessment & Plan Note (Signed)
Declines Shingrix vaccines. Has not completed Covid vaccines. PSA due and pending. Declines colonoscopy given age.   Exam today stable. Labs pending.

## 2020-11-19 NOTE — Assessment & Plan Note (Signed)
Nearly resolved with triamcinolone.

## 2020-11-19 NOTE — Patient Instructions (Signed)
Stop by the lab prior to leaving today. I will notify you of your results once received.   It was a pleasure to see you today!  Preventive Care 80 Years and Older, Male Preventive care refers to lifestyle choices and visits with your health care provider that can promote health and wellness. This includes: A yearly physical exam. This is also called an annual wellness visit. Regular dental and eye exams. Immunizations. Screening for certain conditions. Healthy lifestyle choices, such as: Eating a healthy diet. Getting regular exercise. Not using drugs or products that contain nicotine and tobacco. Limiting alcohol use. What can I expect for my preventive care visit? Physical exam Your health care provider will check your: Height and weight. These may be used to calculate your BMI (body mass index). BMI is a measurement that tells if you are at a healthy weight. Heart rate and blood pressure. Body temperature. Skin for abnormal spots. Counseling Your health care provider may ask you questions about your: Past medical problems. Family's medical history. Alcohol, tobacco, and drug use. Emotional well-being. Home life and relationship well-being. Sexual activity. Diet, exercise, and sleep habits. History of falls. Memory and ability to understand (cognition). Work and work Statistician. Access to firearms. What immunizations do I need?  Vaccines are usually given at various ages, according to a schedule. Your health care provider will recommend vaccines for you based on your age, medicalhistory, and lifestyle or other factors, such as travel or where you work. What tests do I need? Blood tests Lipid and cholesterol levels. These may be checked every 5 years, or more often depending on your overall health. Hepatitis C test. Hepatitis B test. Screening Lung cancer screening. You may have this screening every year starting at age 53 if you have a 30-pack-year history of smoking  and currently smoke or have quit within the past 15 years. Colorectal cancer screening. All adults should have this screening starting at age 55 and continuing until age 68. Your health care provider may recommend screening at age 27 if you are at increased risk. You will have tests every 1-10 years, depending on your results and the type of screening test. Prostate cancer screening. Recommendations will vary depending on your family history and other risks. Genital exam to check for testicular cancer or hernias. Diabetes screening. This is done by checking your blood sugar (glucose) after you have not eaten for a while (fasting). You may have this done every 1-3 years. Abdominal aortic aneurysm (AAA) screening. You may need this if you are a current or former smoker. STD (sexually transmitted disease) testing, if you are at risk. Follow these instructions at home: Eating and drinking  Eat a diet that includes fresh fruits and vegetables, whole grains, lean protein, and low-fat dairy products. Limit your intake of foods with high amounts of sugar, saturated fats, and salt. Take vitamin and mineral supplements as recommended by your health care provider. Do not drink alcohol if your health care provider tells you not to drink. If you drink alcohol: Limit how much you have to 0-2 drinks a day. Be aware of how much alcohol is in your drink. In the [place] one drink equals one 12 oz bottle of beer (355 mL), one 5 oz glass of wine (148 mL), or one 1 oz glass of hard liquor (44 mL).  Lifestyle Take daily care of your teeth and gums. Brush your teeth every morning and night with fluoride toothpaste. Floss one time each day. Stay active. Exercise  for at least 30 minutes 5 or more days each week. Do not use any products that contain nicotine or tobacco, such as cigarettes, e-cigarettes, and chewing tobacco. If you need help quitting, ask your health care provider. Do not use drugs. If you are  sexually active, practice safe sex. Use a condom or other form of protection to prevent STIs (sexually transmitted infections). Talk with your health care provider about taking a low-dose aspirin or statin. Find healthy ways to cope with stress, such as: Meditation, yoga, or listening to music. Journaling. Talking to a trusted person. Spending time with friends and family. Safety Always wear your seat belt while driving or riding in a vehicle. Do not drive: If you have been drinking alcohol. Do not ride with someone who has been drinking. When you are tired or distracted. While texting. Wear a helmet and other protective equipment during sports activities. If you have firearms in your house, make sure you follow all gun safety procedures. What's next? Visit your health care provider once a year for an annual wellness visit. Ask your health care provider how often you should have your eyes and teeth checked. Stay up to date on all vaccines. This information is not intended to replace advice given to you by your health care provider. Make sure you discuss any questions you have with your healthcare provider. Document Revised: 01/30/2019 Document Reviewed: 04/27/2018 Elsevier Patient Education  2022 [employer].

## 2020-11-19 NOTE — Assessment & Plan Note (Signed)
Doing well on pantoprazole 40 mg, continue same.

## 2020-11-24 ENCOUNTER — Other Ambulatory Visit: Payer: Self-pay | Admitting: Primary Care

## 2020-11-24 DIAGNOSIS — K219 Gastro-esophageal reflux disease without esophagitis: Secondary | ICD-10-CM

## 2020-11-24 DIAGNOSIS — I10 Essential (primary) hypertension: Secondary | ICD-10-CM

## 2021-01-27 ENCOUNTER — Other Ambulatory Visit: Payer: Self-pay | Admitting: Primary Care

## 2021-01-27 DIAGNOSIS — R0981 Nasal congestion: Secondary | ICD-10-CM

## 2021-02-02 DIAGNOSIS — N390 Urinary tract infection, site not specified: Secondary | ICD-10-CM | POA: Diagnosis not present

## 2021-02-20 ENCOUNTER — Other Ambulatory Visit: Payer: Self-pay | Admitting: Primary Care

## 2021-02-20 DIAGNOSIS — E785 Hyperlipidemia, unspecified: Secondary | ICD-10-CM

## 2021-02-24 IMAGING — CR DG CHEST 2V
3 series · 3 of 3 positions shown · non-contrast
Comparison: 10/12/2018.

CLINICAL DATA: Cough.  COVID.

EXAM:
CHEST - 2 VIEW

[w chest pa (1 of 2)]
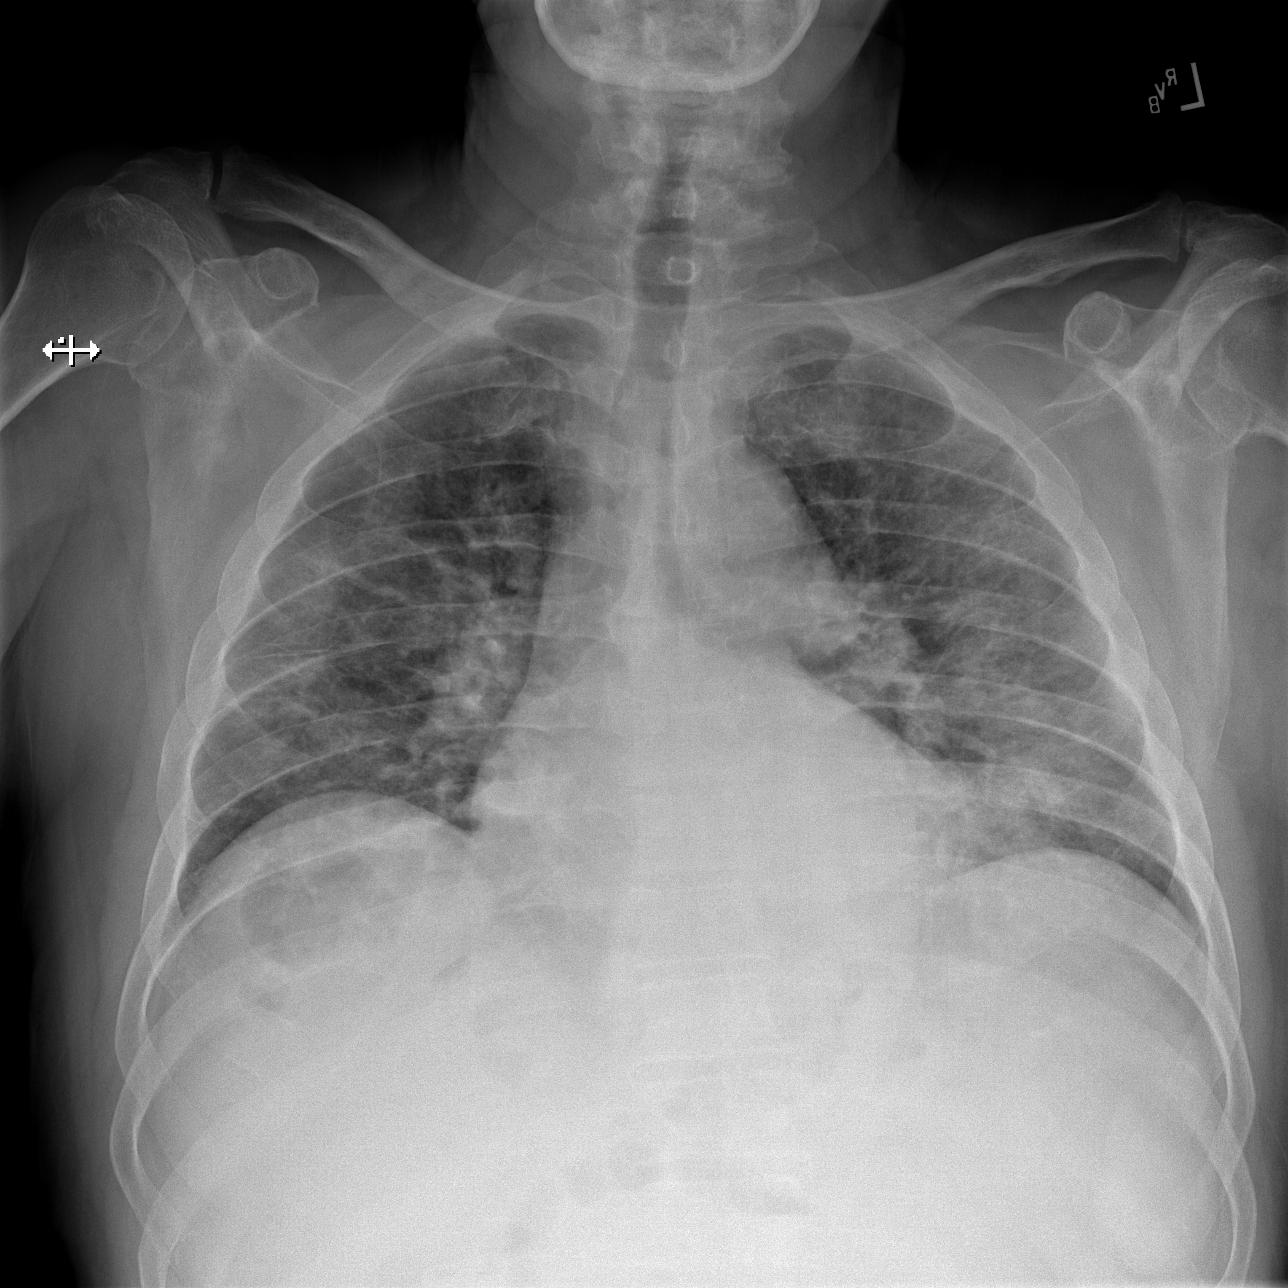

[w chest lat]
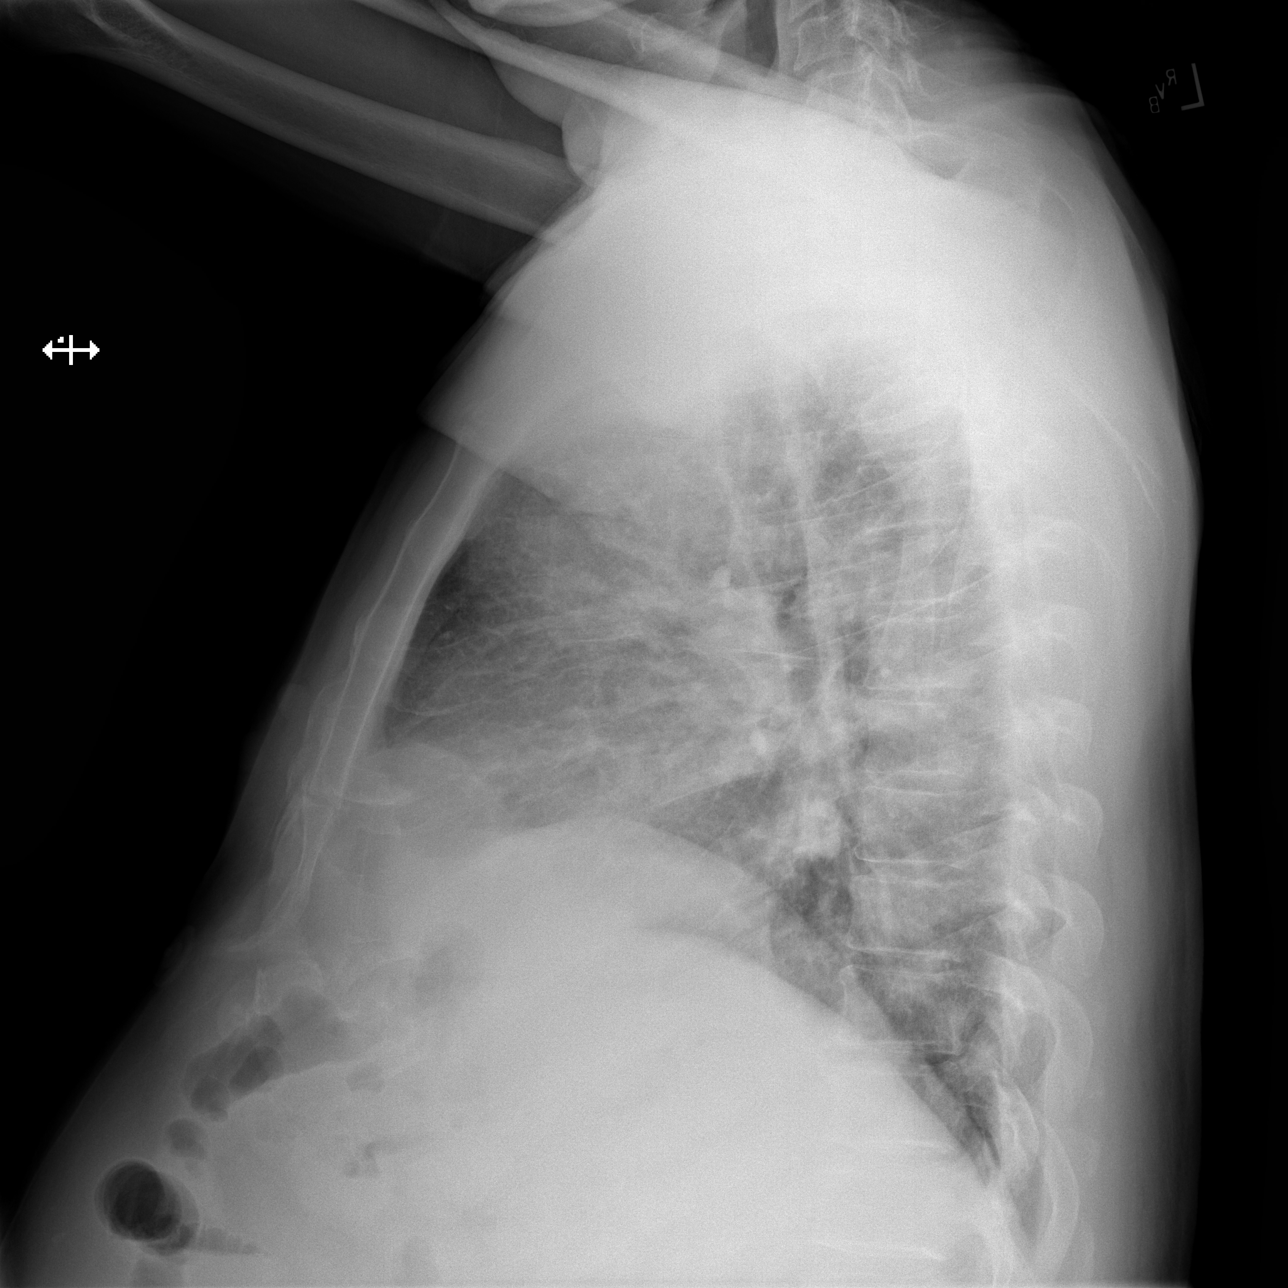

[w chest pa (2 of 2)]
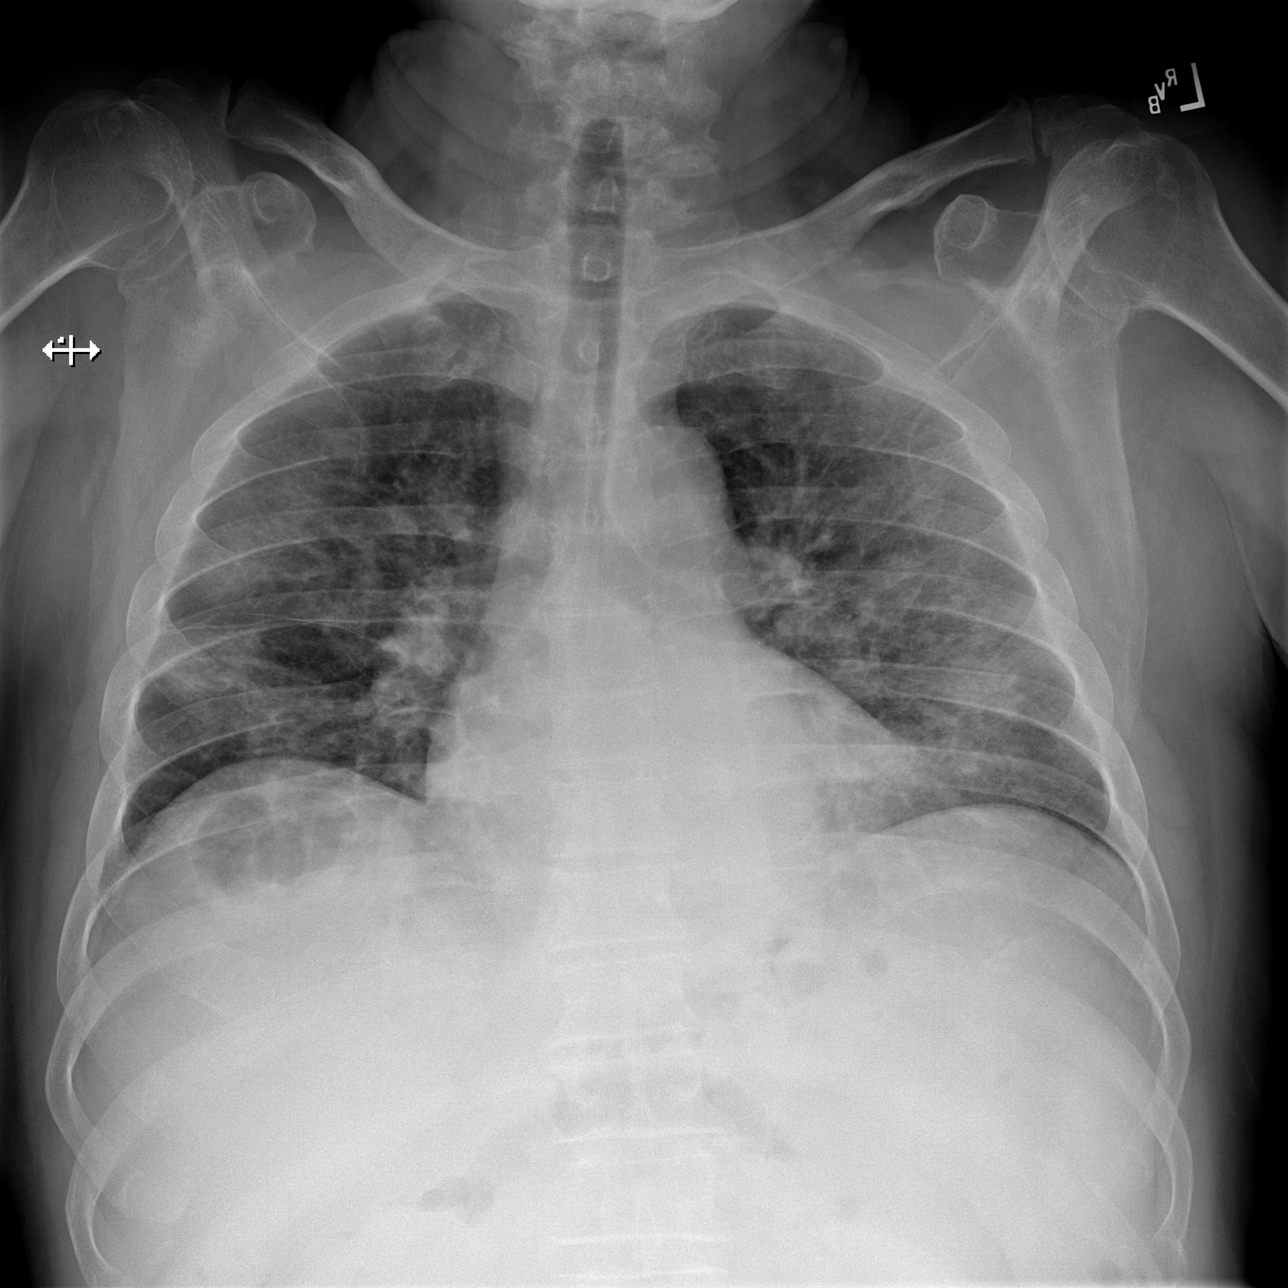

[3 of 3 positions shown; findings below may reference images not displayed]

FINDINGS: Heart size stable. Lung volumes. Prominent diffuse bilateral
interstitial infiltrates. No pleural effusion or pneumothorax no
acute bony abnormality.
IMPRESSION: Prominent diffuse bilateral pulmonary interstitial infiltrates
consistent with pneumonitis.

## 2021-04-01 ENCOUNTER — Other Ambulatory Visit: Payer: Self-pay | Admitting: Primary Care

## 2021-04-01 DIAGNOSIS — I10 Essential (primary) hypertension: Secondary | ICD-10-CM

## 2021-04-02 NOTE — Telephone Encounter (Signed)
Please call patient:  I sent a 1 year supply of amlodipine to [facility] in July 2022. [facility] is now requesting refills.  Which pharmacy is he using? Delete the pharmacy he is not using.

## 2021-04-02 NOTE — Telephone Encounter (Signed)
Called spoke to Daughter on [place]. States that they [facility]. She is almost positive that it is a mix up with [facility]. She will check with her dad but she thinks that is one that he gets from [facility]. She will give [place] a call if anything changes and needs to be sent in to local.

## 2021-05-20 DIAGNOSIS — N390 Urinary tract infection, site not specified: Secondary | ICD-10-CM | POA: Diagnosis not present

## 2021-06-05 DIAGNOSIS — H11042 Peripheral pterygium, stationary, left eye: Secondary | ICD-10-CM | POA: Diagnosis not present

## 2021-06-05 DIAGNOSIS — H52223 Regular astigmatism, bilateral: Secondary | ICD-10-CM | POA: Diagnosis not present

## 2021-06-05 DIAGNOSIS — Z9841 Cataract extraction status, right eye: Secondary | ICD-10-CM | POA: Diagnosis not present

## 2021-06-05 DIAGNOSIS — Z9842 Cataract extraction status, left eye: Secondary | ICD-10-CM | POA: Diagnosis not present

## 2021-08-01 ENCOUNTER — Other Ambulatory Visit: Payer: Self-pay | Admitting: Primary Care

## 2021-08-01 DIAGNOSIS — R0981 Nasal congestion: Secondary | ICD-10-CM

## 2021-10-16 ENCOUNTER — Ambulatory Visit (INDEPENDENT_AMBULATORY_CARE_PROVIDER_SITE_OTHER): Payer: Medicare Other

## 2021-10-16 VITALS — Wt 223.0 lb

## 2021-10-16 DIAGNOSIS — Z0000 Encounter for general adult medical examination without abnormal findings: Secondary | ICD-10-CM | POA: Diagnosis not present

## 2021-10-16 NOTE — Patient Instructions (Signed)
Chad Huang , Thank you for taking time to come for your Medicare Wellness Visit. I appreciate your ongoing commitment to your health goals. Please review the following plan we discussed and let me know if I can assist you in the future.   Screening recommendations/referrals: Colonoscopy: aged out Recommended yearly ophthalmology/optometry visit for glaucoma screening and checkup Recommended yearly dental visit for hygiene and checkup  Vaccinations: Influenza vaccine: n/d Pneumococcal vaccine: 09/22/16 Tdap vaccine: 09/18/08, due Shingles vaccine: n/d   Covid-19: n/d  Advanced directives: yes, copy needed  Conditions/risks identified: no  Next appointment: Follow up in one year for your annual wellness visit. 10/18/22 @ 11:30am by phone  Preventive Care 65 Years and Older, Male Preventive care refers to lifestyle choices and visits with your health care provider that can promote health and wellness. What does preventive care include? A yearly physical exam. This is also called an annual well check. Dental exams once or twice a year. Routine eye exams. Ask your health care provider how often you should have your eyes checked. Personal lifestyle choices, including: Daily care of your teeth and gums. Regular physical activity. Eating a healthy diet. Avoiding tobacco and drug use. Limiting alcohol use. Practicing safe sex. Taking low doses of aspirin every day. Taking vitamin and mineral supplements as recommended by your health care provider. What happens during an annual well check? The services and screenings done by your health care provider during your annual well check will depend on your age, overall health, lifestyle risk factors, and family history of disease. Counseling  Your health care provider may ask you questions about your: Alcohol use. Tobacco use. Drug use. Emotional well-being. Home and relationship well-being. Sexual activity. Eating habits. History of  falls. Memory and ability to understand (cognition). Work and work Statistician. Screening  You may have the following tests or measurements: Height, weight, and BMI. Blood pressure. Lipid and cholesterol levels. These may be checked every 5 years, or more frequently if you are over 55 years old. Skin check. Lung cancer screening. You may have this screening every year starting at age 44 if you have a 30-pack-year history of smoking and currently smoke or have quit within the past 15 years. Fecal occult blood test (FOBT) of the stool. You may have this test every year starting at age 59. Flexible sigmoidoscopy or colonoscopy. You may have a sigmoidoscopy every 5 years or a colonoscopy every 10 years starting at age 26. Prostate cancer screening. Recommendations will vary depending on your family history and other risks. Hepatitis C blood test. Hepatitis B blood test. Sexually transmitted disease (STD) testing. Diabetes screening. This is done by checking your blood sugar (glucose) after you have not eaten for a while (fasting). You may have this done every 1-3 years. Abdominal aortic aneurysm (AAA) screening. You may need this if you are a current or former smoker. Osteoporosis. You may be screened starting at age 52 if you are at high risk. Talk with your health care provider about your test results, treatment options, and if necessary, the need for more tests. Vaccines  Your health care provider may recommend certain vaccines, such as: Influenza vaccine. This is recommended every year. Tetanus, diphtheria, and acellular pertussis (Tdap, Td) vaccine. You may need a Td booster every 10 years. Zoster vaccine. You may need this after age 2. Pneumococcal 13-valent conjugate (PCV13) vaccine. One dose is recommended after age 42. Pneumococcal polysaccharide (PPSV23) vaccine. One dose is recommended after age 83. Talk to your health  care provider about which screenings and vaccines you need and  how often you need them. This information is not intended to replace advice given to you by your health care provider. Make sure you discuss any questions you have with your health care provider. Document Released: 05/30/2015 Document Revised: 01/21/2016 Document Reviewed: 03/04/2015 Elsevier Interactive Patient Education  2017 [place] Prevention in the Home Falls can cause injuries. They can happen to people of all ages. There are many things you can do to make your home safe and to help prevent falls. What can I do on the outside of my home? Regularly fix the edges of walkways and driveways and fix any cracks. Remove anything that might make you trip as you walk through a door, such as a raised step or threshold. Trim any bushes or trees on the path to your home. Use bright outdoor lighting. Clear any walking paths of anything that might make someone trip, such as rocks or tools. Regularly check to see if handrails are loose or broken. Make sure that both sides of any steps have handrails. Any raised decks and porches should have guardrails on the edges. Have any leaves, snow, or ice cleared regularly. Use sand or salt on walking paths during winter. Clean up any spills in your garage right away. This includes oil or grease spills. What can I do in the bathroom? Use night lights. Install grab bars by the toilet and in the tub and shower. Do not use towel bars as grab bars. Use non-skid mats or decals in the tub or shower. If you need to sit down in the shower, use a plastic, non-slip stool. Keep the floor dry. Clean up any water that spills on the floor as soon as it happens. Remove soap buildup in the tub or shower regularly. Attach bath mats securely with double-sided non-slip rug tape. Do not have throw rugs and other things on the floor that can make you trip. What can I do in the bedroom? Use night lights. Make sure that you have a light by your bed that is easy to  reach. Do not use any sheets or blankets that are too big for your bed. They should not hang down onto the floor. Have a firm chair that has side arms. You can use this for support while you get dressed. Do not have throw rugs and other things on the floor that can make you trip. What can I do in the kitchen? Clean up any spills right away. Avoid walking on wet floors. Keep items that you use a lot in easy-to-reach places. If you need to reach something above you, use a strong step stool that has a grab bar. Keep electrical cords out of the way. Do not use floor polish or wax that makes floors slippery. If you must use wax, use non-skid floor wax. Do not have throw rugs and other things on the floor that can make you trip. What can I do with my stairs? Do not leave any items on the stairs. Make sure that there are handrails on both sides of the stairs and use them. Fix handrails that are broken or loose. Make sure that handrails are as long as the stairways. Check any carpeting to make sure that it is firmly attached to the stairs. Fix any carpet that is loose or worn. Avoid having throw rugs at the top or bottom of the stairs. If you do have throw rugs, attach them to  the floor with carpet tape. Make sure that you have a light switch at the top of the stairs and the bottom of the stairs. If you do not have them, ask someone to add them for you. What else can I do to help prevent falls? Wear shoes that: Do not have high heels. Have rubber bottoms. Are comfortable and fit you well. Are closed at the toe. Do not wear sandals. If you use a stepladder: Make sure that it is fully opened. Do not climb a closed stepladder. Make sure that both sides of the stepladder are locked into place. Ask someone to hold it for you, if possible. Clearly mark and make sure that you can see: Any grab bars or handrails. First and last steps. Where the edge of each step is. Use tools that help you move  around (mobility aids) if they are needed. These include: Canes. Walkers. Scooters. Crutches. Turn on the lights when you go into a dark area. Replace any light bulbs as soon as they burn out. Set up your furniture so you have a clear path. Avoid moving your furniture around. If any of your floors are uneven, fix them. If there are any pets around you, be aware of where they are. Review your medicines with your doctor. Some medicines can make you feel dizzy. This can increase your chance of falling. Ask your doctor what other things that you can do to help prevent falls. This information is not intended to replace advice given to you by your health care provider. Make sure you discuss any questions you have with your health care provider. Document Released: 02/27/2009 Document Revised: 10/09/2015 Document Reviewed: 06/07/2014 Elsevier Interactive Patient Education  2017 [employer].

## 2021-10-16 NOTE — Progress Notes (Signed)
Virtual Visit via Telephone Note  I connected with  Chad Huang on 10/16/21 at  9:45 AM EDT by telephone and verified that I am speaking with the correct person using two identifiers.  Location: Patient: home Provider: [place] Persons participating in the virtual visit: [place]   I discussed the limitations, risks, security and privacy concerns of performing an evaluation and management service by telephone and the availability of in person appointments. The patient expressed understanding and agreed to proceed.  Interactive audio and video telecommunications were attempted between this nurse and patient, however failed, due to patient having technical difficulties OR patient did not have access to video capability.  We continued and completed visit with audio only.  Some vital signs may be absent or patient reported.   Dionisio David, LPN  Subjective:   Chad Huang is a 81 y.o. male who presents for Medicare Annual/Subsequent preventive examination.  Review of Systems           Objective:    There were no vitals filed for this visit. There is no height or weight on file to calculate BMI.     10/15/2020    9:52 AM 05/21/2020   11:09 AM 10/12/2019   10:39 AM 10/11/2018    1:52 PM 09/26/2017   10:59 AM 09/22/2016   11:25 AM 06/23/2012    5:00 AM  Advanced Directives  Does Patient Have a Medical Advance Directive? Yes No Yes Yes Yes Yes Patient has advance directive, copy in chart  Type of Advance Directive [place];Living will  [place];Living will [employer];Living will [place];Living will [place];Living will [place];Living will  Copy of [place] in Chart? Yes - validated most recent copy scanned in chart (See row information)  Yes - validated most recent copy scanned in chart (See row information) No - copy  requested No - copy requested Yes   Pre-existing out of facility DNR order (yellow form or pink MOST form)       No    Current Medications (verified) Outpatient Encounter Medications as of 10/16/2021  Medication Sig   amLODipine (NORVASC) 5 MG tablet TAKE 1 TABLET BY MOUTH  DAILY FOR BLOOD PRESSURE   Ascorbic Acid (VITAMIN C) 1000 MG tablet Take 1,000 mg by mouth daily.   aspirin 81 MG tablet Take 81 mg by mouth daily.   fluticasone (FLONASE) 50 MCG/ACT nasal spray PLACE 1 SPRAY INTO BOTH NOSTRILS 2 TIMES DAILY AS NEEDED FOR ALLERGIES OR RHINITIS.   pantoprazole (PROTONIX) 40 MG tablet TAKE 1 TABLET BY MOUTH  DAILY FOR HEARTBURN   simvastatin (ZOCOR) 40 MG tablet TAKE 1 TABLET BY MOUTH EVERY DAY FOR CHOLESTEROL   tamsulosin (FLOMAX) 0.4 MG CAPS capsule Take 0.4 mg by mouth daily.   triamcinolone ointment (KENALOG) 0.5 % APPLY TO AFFECTED AREA TWICE A DAY   Zinc 50 MG TABS Take by mouth daily.   Dextromethorphan-guaiFENesin ([place] DM PO) Take by mouth as needed. (Patient not taking: Reported on 10/16/2021)   sulfamethoxazole-trimethoprim (BACTRIM DS) 800-160 MG tablet Take 1 tablet by mouth 2 (two) times daily. (Patient not taking: Reported on 10/16/2021)   No facility-administered encounter medications on file as of 10/16/2021.    Allergies (verified) Augmentin [amoxicillin-pot clavulanate], Clarithromycin, Codeine phosphate, and Nabumetone   History: Past Medical History:  Diagnosis Date   Alcohol abuse    Anxiety and depression  charter, admission   Back pain    ([place]), microdiskectomy-great result   Benign prostatic hypertrophy    per Dr Jeffie Pollock with uro   Cataracts, bilateral    Cerumen impaction    left   Chest pain    GERD (gastroesophageal reflux disease)    Hearing loss    Hiatal hernia    nodule   HTN (hypertension)    Hyperlipidemia, mixed    Inguinal hernia    left, hx of, repair   Internal hemorrhoids    Left ear pain    Muscle disorder    disorder,  muscle/ligaminet nis, rt lower extremity   Prostate cancer ([place]) 2010   Dr Jeffie Pollock   Skin cancer    hx, left ear   Past Surgical History:  Procedure Laterality Date   back microdiskectomy  10/01   [facility]   BACK SURGERY     CARDIOVASCULAR STRESS TEST  2009   [facility]   COLONOSCOPY  5/05   polyps int hemorrhoids, repeat 10 yrs   ESOPHAGOGASTRODUODENOSCOPY  03/02/00   gastritis, chronic   ESOPHAGOGASTRODUODENOSCOPY  5/05   polyps in antrum, neg H Pylori   ETT  05/19/07   Adenosine Muoview   H Pylori positive  8/98   HERNIA REPAIR     LAPAROSCOPIC INGUINAL HERNIA REPAIR  10/96   levitin eval  02/05/99   hip pain   LUMBAR SPINE SURGERY  5/00   MRI  10/07/98   degen changes 9Dr. French Ana) steroid injections   MYELOGRAM  9/01   stenosis L4-5   Family History  Problem Relation Age of Onset   Prostate cancer Father    Stroke Other        grandmother   Lung cancer Sister    Heart disease Brother    Social History   Socioeconomic History   Marital status: Widowed    Spouse name: Not on file   Number of children: Not on file   Years of education: Not on file   Highest education level: Not on file  Occupational History   Not on file  Tobacco Use   Smoking status: Former    Packs/day: 1.00    Years: 20.00    Pack years: 20.00    Types: Cigarettes   Smokeless tobacco: Current    Types: Chew   Tobacco comments:    quit in 1973  Vaping Use   Vaping Use: Never used  Substance and Sexual Activity   Alcohol use: No    Comment: none since 1996   Drug use: No   Sexual activity: Not Currently  Other Topics Concern   Not on file  Social History Narrative   Married,    One daughter.    1 granddaughter.   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      [phone] (c?)   Social Determinants of Health   Financial Resource Strain: Low Risk    Difficulty of Paying Living Expenses: Not hard at all  Food Insecurity: No Food Insecurity   Worried About Charity fundraiser  in the Last Year: Never true   Ran Out of Food in the Last Year: Never true  Transportation Needs: No Transportation Needs   Lack of Transportation (Medical): No   Lack of Transportation (Non-Medical): No  Physical Activity: Insufficiently Active   Days of Exercise per Week: 2 days   Minutes of Exercise per Session: 20 min  Stress: No Stress Concern Present   Feeling  of Stress : Not at all  Social Connections: Not on file    Tobacco Counseling Ready to quit: Not Answered Counseling given: Not Answered Tobacco comments: quit in 1973   Clinical Intake:  Pre-visit preparation completed: Yes  Pain : No/denies pain     Nutritional Risks: None Diabetes: No  How often do you need to have someone help you when you read instructions, pamphlets, or other written materials from your doctor or pharmacy?: 1 - Never  Diabetic?no  Interpreter Needed?: No  Information entered by :: Kirke Shaggy, LPN   Activities of Daily Living     View : No data to display.          Patient Care Team: [name], NP as PCP - General (Internal Medicine) Thelma Comp, [place] as Consulting Physician (Optometry) Ladene Artist, MD as Consulting Physician (Gastroenterology) Rozetta Nunnery, MD (Inactive) as Consulting Physician (Otolaryngology) Irine Seal, MD as Attending Physician (Urology) Karie Chimera, MD as Referring Physician (Neurosurgery)  Indicate any recent Medical Services you may have received from other than [facility] providers in the past year (date may be approximate).     Assessment:   This is a routine wellness examination for Chad Huang.  Hearing/Vision screen No results found.  Dietary issues and exercise activities discussed:     Goals Addressed             This Visit's Progress    DIET - EAT MORE FRUITS AND VEGETABLES         Depression Screen    10/16/2021   10:00 AM 10/15/2020    9:54 AM 10/12/2019   10:42 AM 10/11/2018    1:40 PM 09/26/2017    10:50 AM 09/22/2016   10:45 AM  PHQ 2/9 Scores  PHQ - 2 Score 0 0 0 0 0 0  PHQ- 9 Score 0 0 0 0 0     Fall Risk    10/15/2020    9:53 AM 10/12/2019   10:40 AM 10/11/2018    1:40 PM 09/26/2017   10:50 AM 09/22/2016   10:45 AM  Fall Risk   Falls in the past year? 0 0 0 No No  Number falls in past yr: 0 0     Injury with Fall? 0 0     Risk for fall due to : Medication side effect Medication side effect     Follow up Falls evaluation completed;Falls prevention discussed Falls evaluation completed;Falls prevention discussed       FALL RISK PREVENTION PERTAINING TO THE HOME:  Any stairs in or around the home? No  If so, are there any without handrails? No  Home free of loose throw rugs in walkways, pet beds, electrical cords, etc? Yes  Adequate lighting in your home to reduce risk of falls? Yes   ASSISTIVE DEVICES UTILIZED TO PREVENT FALLS:  Life alert? No  Use of a cane, walker or w/c? No  Grab bars in the bathroom? No  Shower chair or bench in shower? No  Elevated toilet seat or a handicapped toilet? No    Cognitive Function: declined test      10/15/2020   10:04 AM 10/12/2019   10:43 AM 10/11/2018    1:52 PM 09/26/2017   10:57 AM 09/22/2016   10:45 AM  MMSE - Mini Mental State Exam  Not completed: Refused      Orientation to time  '5 5 5 5  '$ Orientation to Place  '5 5 5 5  '$ Registration  $'3 3 3 3  'h$ Attention/ Calculation  5 0 0 5  Recall  '3 3 3 3  '$ Language- name 2 objects   0 0 0  Language- repeat  '1 1 1 1  '$ Language- follow 3 step command   0 3 3  Language- read & follow direction   0 0 0  Write a sentence   0 0 0  Copy design   0 0 0  Total score   '17 20 25        '$ Immunizations Immunization History  Administered Date(s) Administered   Influenza-Unspecified 05/31/2016   Pneumococcal Conjugate-13 09/22/2016   Pneumococcal Polysaccharide-23 05/17/2008   Td 06/17/1993, 09/18/2008    TDAP status: Due, Education has been provided regarding the importance of this vaccine.  Advised may receive this vaccine at local pharmacy or Health Dept. Aware to provide a copy of the vaccination record if obtained from local pharmacy or Health Dept. Verbalized acceptance and understanding.  Flu Vaccine status: Declined, Education has been provided regarding the importance of this vaccine but patient still declined. Advised may receive this vaccine at local pharmacy or Health Dept. Aware to provide a copy of the vaccination record if obtained from local pharmacy or Health Dept. Verbalized acceptance and understanding.  Pneumococcal vaccine status: Up to date  Covid-19 vaccine status: Declined, Education has been provided regarding the importance of this vaccine but patient still declined. Advised may receive this vaccine at local pharmacy or Health Dept.or vaccine clinic. Aware to provide a copy of the vaccination record if obtained from local pharmacy or Health Dept. Verbalized acceptance and understanding.  Qualifies for Shingles Vaccine? Yes   Zostavax completed No   Shingrix Completed?: No.    Education has been provided regarding the importance of this vaccine. Patient has been advised to call insurance company to determine out of pocket expense if they have not yet received this vaccine. Advised may also receive vaccine at local pharmacy or Health Dept. Verbalized acceptance and understanding.  Screening Tests Health Maintenance  Topic Date Due   Zoster Vaccines- Shingrix (1 of 2) Never done   COVID-19 Vaccine (1) 10/31/2021 (Originally 09/05/1941)   TETANUS/TDAP  10/16/2023 (Originally 09/19/2018)   INFLUENZA VACCINE  12/15/2021   Pneumonia Vaccine 19+ Years old  Completed   HPV VACCINES  Aged Out    Health Maintenance  Health Maintenance Due  Topic Date Due   Zoster Vaccines- Shingrix (1 of 2) Never done    Colorectal cancer screening: No longer required.   Lung Cancer Screening: (Low Dose CT Chest recommended if Age 42-80 years, 30 pack-year currently smoking OR  have quit w/in 15years.) does not qualify.    Additional Screening:  Hepatitis C Screening: does not qualify; Completed no  Vision Screening: Recommended annual ophthalmology exams for early detection of glaucoma and other disorders of the eye. Is the patient up to date with their annual eye exam?  Yes  Who is the provider or what is the name of the office in which the patient attends annual eye exams? [facility] If pt is not established with a provider, would they like to be referred to a provider to establish care? No .   Dental Screening: Recommended annual dental exams for proper oral hygiene  Community Resource Referral / Chronic Care Management: CRR required this visit?  No   CCM required this visit?  No      Plan:     I have personally reviewed and noted the following in  the patient's chart:   Medical and social history Use of alcohol, tobacco or illicit drugs  Current medications and supplements including opioid prescriptions. Patient is not currently taking opioid prescriptions. Functional ability and status Nutritional status Physical activity Advanced directives List of other physicians Hospitalizations, surgeries, and ER visits in previous 12 months Vitals Screenings to include cognitive, depression, and falls Referrals and appointments  In addition, I have reviewed and discussed with patient certain preventive protocols, quality metrics, and best practice recommendations. A written personalized care plan for preventive services as well as general preventive health recommendations were provided to patient.     Dionisio David, LPN   0/05/270   Nurse Notes: none

## 2021-11-01 ENCOUNTER — Other Ambulatory Visit: Payer: Self-pay | Admitting: Primary Care

## 2021-11-01 DIAGNOSIS — R0981 Nasal congestion: Secondary | ICD-10-CM

## 2021-11-20 ENCOUNTER — Ambulatory Visit (INDEPENDENT_AMBULATORY_CARE_PROVIDER_SITE_OTHER): Payer: Medicare Other | Admitting: Primary Care

## 2021-11-20 ENCOUNTER — Other Ambulatory Visit: Payer: Self-pay | Admitting: Primary Care

## 2021-11-20 ENCOUNTER — Encounter: Payer: Self-pay | Admitting: Primary Care

## 2021-11-20 ENCOUNTER — Telehealth: Payer: Self-pay

## 2021-11-20 VITALS — BP 124/76 | HR 82 | Temp 98.6°F | Ht 70.0 in | Wt 217.0 lb

## 2021-11-20 DIAGNOSIS — E782 Mixed hyperlipidemia: Secondary | ICD-10-CM

## 2021-11-20 DIAGNOSIS — Z0001 Encounter for general adult medical examination with abnormal findings: Secondary | ICD-10-CM

## 2021-11-20 DIAGNOSIS — I499 Cardiac arrhythmia, unspecified: Secondary | ICD-10-CM

## 2021-11-20 DIAGNOSIS — K219 Gastro-esophageal reflux disease without esophagitis: Secondary | ICD-10-CM | POA: Diagnosis not present

## 2021-11-20 DIAGNOSIS — I4819 Other persistent atrial fibrillation: Secondary | ICD-10-CM | POA: Insufficient documentation

## 2021-11-20 DIAGNOSIS — I10 Essential (primary) hypertension: Secondary | ICD-10-CM | POA: Diagnosis not present

## 2021-11-20 DIAGNOSIS — I4891 Unspecified atrial fibrillation: Secondary | ICD-10-CM | POA: Diagnosis not present

## 2021-11-20 DIAGNOSIS — Z8546 Personal history of malignant neoplasm of prostate: Secondary | ICD-10-CM | POA: Diagnosis not present

## 2021-11-20 DIAGNOSIS — H9190 Unspecified hearing loss, unspecified ear: Secondary | ICD-10-CM

## 2021-11-20 DIAGNOSIS — R7303 Prediabetes: Secondary | ICD-10-CM

## 2021-11-20 HISTORY — DX: Cardiac arrhythmia, unspecified: I49.9

## 2021-11-20 LAB — LIPID PANEL
Cholesterol: 134 mg/dL (ref 0–200)
HDL: 32.6 mg/dL — ABNORMAL LOW (ref >39.00)
LDL Cholesterol: 78 mg/dL (ref 0–99)
NonHDL: 101.36
Total CHOL/HDL Ratio: 4
Triglycerides: 119 mg/dL (ref 0.0–149.0)
VLDL: 23.8 mg/dL (ref 0.0–40.0)

## 2021-11-20 LAB — CBC
HCT: 47.4 % (ref 39.0–52.0)
Hemoglobin: 16.1 g/dL (ref 13.0–17.0)
MCHC: 34 g/dL (ref 30.0–36.0)
MCV: 85.9 fl (ref 78.0–100.0)
Platelets: 169 10*3/uL (ref 150.0–400.0)
RBC: 5.52 Mil/uL (ref 4.22–5.81)
RDW: 13.3 % (ref 11.5–15.5)
WBC: 6.8 10*3/uL (ref 4.0–10.5)

## 2021-11-20 LAB — COMPREHENSIVE METABOLIC PANEL
ALT: 10 U/L (ref 0–53)
AST: 16 U/L (ref 0–37)
Albumin: 4.3 g/dL (ref 3.5–5.2)
Alkaline Phosphatase: 73 U/L (ref 39–117)
BUN: 16 mg/dL (ref 6–23)
CO2: 25 mEq/L (ref 19–32)
Calcium: 8.9 mg/dL (ref 8.4–10.5)
Chloride: 106 mEq/L (ref 96–112)
Creatinine, Ser: 1.28 mg/dL (ref 0.40–1.50)
GFR: 52.78 mL/min — ABNORMAL LOW (ref >60.00)
Glucose, Bld: 88 mg/dL (ref 70–99)
Potassium: 4 mEq/L (ref 3.5–5.1)
Sodium: 140 mEq/L (ref 135–145)
Total Bilirubin: 1 mg/dL (ref 0.2–1.2)
Total Protein: 6.2 g/dL (ref 6.0–8.3)

## 2021-11-20 LAB — PSA, MEDICARE: PSA: 10.32 ng/ml — ABNORMAL HIGH (ref 0.10–4.00)

## 2021-11-20 LAB — HEMOGLOBIN A1C: Hgb A1c MFr Bld: 5.9 % (ref 4.6–6.5)

## 2021-11-20 LAB — TSH: TSH: 1.69 u[IU]/mL (ref 0.35–5.50)

## 2021-11-20 MED ORDER — APIXABAN 5 MG PO TABS: 5.0000 mg | ORAL_TABLET | Freq: Two times a day (BID) | ORAL | 0 refills | Status: DC

## 2021-11-20 MED ORDER — METOPROLOL TARTRATE 25 MG PO TABS: 12.5000 mg | ORAL_TABLET | Freq: Two times a day (BID) | ORAL | 0 refills | Status: DC

## 2021-11-20 NOTE — Assessment & Plan Note (Addendum)
Noted on exam today. Patient asymptomatic.  ECG today with irregular rhythm with rate of 91, no p-waves. Atrial fibrillation. Compared to ECG from 2022 and atrial fibrillation was not noted.   Echocardiogram and labs pending today. Need to update renal function.   Referral placed to cardiology for establishment and monitoring.  Chadsvasc score of 3 today for age and hypertension history.  Await renal function results. Anticipate Eliquis initiation.

## 2021-11-20 NOTE — Assessment & Plan Note (Signed)
Noted on exam today, confirmed with ECG. Asymptomatic.   Checking labs today including TSH, CMP, CBC  Start metoprolol tartrate 12.5 mg BID. Chadsvasc score of 3 today for age and hypertension. Will initiate Eliquis once renal function returns.   Echocardiogram ordered and pending. Referral placed to cardiology.

## 2021-11-20 NOTE — Telephone Encounter (Signed)
Please kindly notify Chad Huang that I am waiting for his kidney function test to return, and at that point I had planned to put him on Eliquis.  It should not interfere with anything that he is currently taking.

## 2021-11-20 NOTE — Patient Instructions (Signed)
Stop by the lab prior to leaving today. I will notify you of your results once received.   You will be contacted regarding your referral to the heart doctor and also for the heart test called echocardiogram.  Please let us know if you have not been contacted within two weeks.   Start metoprolol tartrate 25 mg.  Take 1/2 tablet by mouth twice daily for heart rhythm and control.  We will start a blood thinner medication once your labs return.  It was a pleasure to see you today!  Preventive Care 66 Years and Older, Male Preventive care refers to lifestyle choices and visits with your health care provider that can promote health and wellness. Preventive care visits are also called wellness exams. What can I expect for my preventive care visit? Counseling During your preventive care visit, your health care provider may ask about your: Medical history, including: Past medical problems. Family medical history. History of falls. Current health, including: Emotional well-being. Home life and relationship well-being. Sexual activity. Memory and ability to understand (cognition). Lifestyle, including: Alcohol, nicotine or tobacco, and drug use. Access to firearms. Diet, exercise, and sleep habits. Work and work Statistician. Sunscreen use. Safety issues such as seatbelt and bike helmet use. Physical exam Your health care provider will check your: Height and weight. These may be used to calculate your BMI (body mass index). BMI is a measurement that tells if you are at a healthy weight. Waist circumference. This measures the distance around your waistline. This measurement also tells if you are at a healthy weight and may help predict your risk of certain diseases, such as type 2 diabetes and high blood pressure. Heart rate and blood pressure. Body temperature. Skin for abnormal spots. What immunizations do I need?  Vaccines are usually given at various ages, according to a schedule. Your  health care provider will recommend vaccines for you based on your age, medical history, and lifestyle or other factors, such as travel or where you work. What tests do I need? Screening Your health care provider may recommend screening tests for certain conditions. This may include: Lipid and cholesterol levels. Diabetes screening. This is done by checking your blood sugar (glucose) after you have not eaten for a while (fasting). Hepatitis C test. Hepatitis B test. HIV (human immunodeficiency virus) test. STI (sexually transmitted infection) testing, if you are at risk. Lung cancer screening. Colorectal cancer screening. Prostate cancer screening. Abdominal aortic aneurysm (AAA) screening. You may need this if you are a current or former smoker. Talk with your health care provider about your test results, treatment options, and if necessary, the need for more tests. Follow these instructions at home: Eating and drinking  Eat a diet that includes fresh fruits and vegetables, whole grains, lean protein, and low-fat dairy products. Limit your intake of foods with high amounts of sugar, saturated fats, and salt. Take vitamin and mineral supplements as recommended by your health care provider. Do not drink alcohol if your health care provider tells you not to drink. If you drink alcohol: Limit how much you have to 0-2 drinks a day. Know how much alcohol is in your drink. In the [place] one drink equals one 12 oz bottle of beer (355 mL), one 5 oz glass of wine (148 mL), or one 1 oz glass of hard liquor (44 mL). Lifestyle Brush your teeth every morning and night with fluoride toothpaste. Floss one time each day. Exercise for at least 30 minutes 5 or more days  each week. Do not use any products that contain nicotine or tobacco. These products include cigarettes, chewing tobacco, and vaping devices, such as e-cigarettes. If you need help quitting, ask your health care provider. Do not use  drugs. If you are sexually active, practice safe sex. Use a condom or other form of protection to prevent STIs. Take aspirin only as told by your health care provider. Make sure that you understand how much to take and what form to take. Work with your health care provider to find out whether it is safe and beneficial for you to take aspirin daily. Ask your health care provider if you need to take a cholesterol-lowering medicine (statin). Find healthy ways to manage stress, such as: Meditation, yoga, or listening to music. Journaling. Talking to a trusted person. Spending time with friends and family. Safety Always wear your seat belt while driving or riding in a vehicle. Do not drive: If you have been drinking alcohol. Do not ride with someone who has been drinking. When you are tired or distracted. While texting. If you have been using any mind-altering substances or drugs. Wear a helmet and other protective equipment during sports activities. If you have firearms in your house, make sure you follow all gun safety procedures. Minimize exposure to UV radiation to reduce your risk of skin cancer. What's next? Visit your health care provider once a year for an annual wellness visit. Ask your health care provider how often you should have your eyes and teeth checked. Stay up to date on all vaccines. This information is not intended to replace advice given to you by your health care provider. Make sure you discuss any questions you have with your health care provider. Document Revised: 10/29/2020 Document Reviewed: 10/29/2020 Elsevier Patient Education  [place].

## 2021-11-20 NOTE — Assessment & Plan Note (Signed)
Repeat lipid panel pending.  Continue simvastatin 40 mg daily.

## 2021-11-20 NOTE — Assessment & Plan Note (Signed)
Chronic, ongoing.  No concerns today.

## 2021-11-20 NOTE — Telephone Encounter (Signed)
Threasa Beards called back in stating that she spoke with Dr.Turner at [facility] in [place], and she recommend that Chad Huang to be on Eliquis if it doesn't interfere with any of his current medications.

## 2021-11-20 NOTE — Assessment & Plan Note (Addendum)
Discussed to obtain Shingrix vaccine from the pharmacy. Other vaccines UTD. PSA pending. Declines colonoscopy given age.  Discussed the importance of a healthy diet and regular exercise in order for weight loss, and to reduce the risk of further co-morbidity.  Exam today as noted. Labs pending.

## 2021-11-20 NOTE — Assessment & Plan Note (Signed)
Discussed the importance of a healthy diet and regular exercise in order for weight loss, and to reduce the risk of further co-morbidity. ? ?Repeat A1C pending. ?

## 2021-11-20 NOTE — Assessment & Plan Note (Signed)
Controlled.  Continue pantoprazole 40 mg daily. 

## 2021-11-20 NOTE — Assessment & Plan Note (Signed)
Following with Urology, Dr. Jeffie Pollock. PSA is UTD.  He has follow up scheduled for 11/30/21. Continue tamsulosin 0.4 mg daily.

## 2021-11-20 NOTE — Progress Notes (Signed)
Subjective:    Patient ID: Chad Huang, male    DOB: 1941-02-07, 81 y.o.   MRN: [id number]  HPI  MARCUS SCHWANDT is a very pleasant 81 y.o. male who presents today for complete physical and follow up of chronic conditions.  Immunizations: -Tetanus: 2010 -Influenza: Did not complete last season -Covid-19: Has not completed -Shingles: Never completed, does not plan on getting these -Pneumonia: Prevnar 13 in 2018, Pneumovax 23 in 2010  Diet: Reserve.  Exercise: No regular exercise.  Eye exam: Completes annually  Dental exam: Completes semi-annually   Colonoscopy: Completed in 2005, declines given age  PSA: UTD, follows with Urology   BP Readings from Last 3 Encounters:  11/20/21 124/76  11/19/20 126/80  06/12/20 136/80     Review of Systems  Constitutional:  Negative for unexpected weight change.  HENT:  Negative for rhinorrhea.   Respiratory:  Negative for cough and shortness of breath.   Cardiovascular:  Negative for chest pain.  Gastrointestinal:  Negative for constipation and diarrhea.  Genitourinary:  Negative for difficulty urinating.  Musculoskeletal:  Positive for arthralgias.  Skin:  Negative for rash.  Allergic/Immunologic: Negative for environmental allergies.  Neurological:  Negative for dizziness and headaches.  Psychiatric/Behavioral:  The patient is not nervous/anxious.          Past Medical History:  Diagnosis Date   Alcohol abuse    Anxiety and depression    charter, admission   Back pain    (Kritzer), microdiskectomy-great result   Benign prostatic hypertrophy    per Dr Jeffie Pollock with uro   Cataracts, bilateral    Cerumen impaction    left   Chest pain    GERD (gastroesophageal reflux disease)    Hearing loss    Hiatal hernia    nodule   HTN (hypertension)    Hyperlipidemia, mixed    Inguinal hernia    left, hx of, repair   Internal hemorrhoids    Left ear pain    Muscle disorder    disorder, muscle/ligaminet nis, rt lower  extremity   Prostate cancer ([place]) 2010   Dr Jeffie Pollock   Skin cancer    hx, left ear    Social History   Socioeconomic History   Marital status: Widowed    Spouse name: Not on file   Number of children: Not on file   Years of education: Not on file   Highest education level: Not on file  Occupational History   Not on file  Tobacco Use   Smoking status: Former    Packs/day: 1.00    Years: 20.00    Total pack years: 20.00    Types: Cigarettes   Smokeless tobacco: Current    Types: Chew   Tobacco comments:    quit in 1973  Vaping Use   Vaping Use: Never used  Substance and Sexual Activity   Alcohol use: No    Comment: none since 1996   Drug use: No   Sexual activity: Not Currently  Other Topics Concern   Not on file  Social History Narrative   Married,    One daughter.    1 granddaughter.   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      [phone] (c?)   Social Determinants of Health   Financial Resource Strain: Low Risk  (10/16/2021)   Overall Financial Resource Strain (CARDIA)    Difficulty of Paying Living Expenses: Not hard at all  Food Insecurity: No Food  Insecurity (10/16/2021)   Hunger Vital Sign    Worried About Running Out of Food in the Last Year: Never true    Ran Out of Food in the Last Year: Never true  Transportation Needs: No Transportation Needs (10/16/2021)   PRAPARE - Hydrologist (Medical): No    Lack of Transportation (Non-Medical): No  Physical Activity: Insufficiently Active (10/16/2021)   Exercise Vital Sign    Days of Exercise per Week: 2 days    Minutes of Exercise per Session: 20 min  Stress: No Stress Concern Present (10/16/2021)   [place]    Feeling of Stress : Not at all  Social Connections: Moderately Isolated (10/16/2021)   Social Connection and Isolation Panel [NHANES]    Frequency of Communication with Friends and Family: More than three  times a week    Frequency of Social Gatherings with Friends and Family: More than three times a week    Attends Religious Services: More than 4 times per year    Active Member of [employer] or Organizations: No    Attends Archivist Meetings: Never    Marital Status: Widowed  Intimate Partner Violence: Not At Risk (10/16/2021)   Humiliation, Afraid, Rape, and Kick questionnaire    Fear of Current or Ex-Partner: No    Emotionally Abused: No    Physically Abused: No    Sexually Abused: No    Past Surgical History:  Procedure Laterality Date   back microdiskectomy  10/01   Kritzer   BACK SURGERY     CARDIOVASCULAR STRESS TEST  2009   [place]   COLONOSCOPY  5/05   polyps int hemorrhoids, repeat 10 yrs   ESOPHAGOGASTRODUODENOSCOPY  03/02/00   gastritis, chronic   ESOPHAGOGASTRODUODENOSCOPY  5/05   polyps in antrum, neg H Pylori   ETT  05/19/07   Adenosine Muoview   H Pylori positive  8/98   HERNIA REPAIR     LAPAROSCOPIC INGUINAL HERNIA REPAIR  10/96   levitin eval  02/05/99   hip pain   LUMBAR SPINE SURGERY  5/00   MRI  10/07/98   degen changes 9Dr. French Ana) steroid injections   MYELOGRAM  9/01   stenosis L4-5    Family History  Problem Relation Age of Onset   Prostate cancer Father    Stroke Other        grandmother   Lung cancer Sister    Heart disease Brother     Allergies  Allergen Reactions   Augmentin [Amoxicillin-Pot Clavulanate] Diarrhea   Clarithromycin     REACTION: unspecified   Codeine Phosphate     REACTION: nausea   Nabumetone     REACTION: Muscle cramps    Current Outpatient Medications on File Prior to Visit  Medication Sig Dispense Refill   amLODipine (NORVASC) 5 MG tablet TAKE 1 TABLET BY MOUTH  DAILY FOR BLOOD PRESSURE 90 tablet 3   Ascorbic Acid (VITAMIN C) 1000 MG tablet Take 1,000 mg by mouth daily.     aspirin 81 MG tablet Take 81 mg by mouth daily.     fluticasone (FLONASE) 50 MCG/ACT nasal spray PLACE 1 SPRAY INTO BOTH NOSTRILS 2  TIMES DAILY AS NEEDED FOR ALLERGIES OR RHINITIS. 48 mL 0   pantoprazole (PROTONIX) 40 MG tablet TAKE 1 TABLET BY MOUTH  DAILY FOR HEARTBURN 90 tablet 3   simvastatin (ZOCOR) 40 MG tablet TAKE 1 TABLET BY MOUTH EVERY DAY FOR  CHOLESTEROL 90 tablet 2   tamsulosin (FLOMAX) 0.4 MG CAPS capsule Take 0.4 mg by mouth daily.     Zinc 50 MG TABS Take by mouth daily.     No current facility-administered medications on file prior to visit.    BP 124/76   Pulse 82   Temp 98.6 F (37 C) (Oral)   Ht '5\' 10"'$  (1.778 m)   Wt 217 lb (98.4 kg)   SpO2 97%   BMI 31.14 kg/m  Objective:   Physical Exam HENT:     Right Ear: Tympanic membrane and ear canal normal.     Left Ear: Tympanic membrane and ear canal normal.     Nose: Nose normal.     Right Sinus: No maxillary sinus tenderness or frontal sinus tenderness.     Left Sinus: No maxillary sinus tenderness or frontal sinus tenderness.  Eyes:     Conjunctiva/sclera: Conjunctivae normal.  Neck:     Thyroid: No thyromegaly.     Vascular: No carotid bruit.  Cardiovascular:     Rate and Rhythm: Normal rate. Rhythm irregular.     Heart sounds: Normal heart sounds.  Pulmonary:     Effort: Pulmonary effort is normal.     Breath sounds: Normal breath sounds. No wheezing or rales.  Abdominal:     General: Bowel sounds are normal.     Palpations: Abdomen is soft.     Tenderness: There is no abdominal tenderness.  Musculoskeletal:        General: Normal range of motion.     Cervical back: Neck supple.  Skin:    General: Skin is warm and dry.  Neurological:     Mental Status: He is alert and oriented to person, place, and time.     Cranial Nerves: No cranial nerve deficit.     Deep Tendon Reflexes: Reflexes are normal and symmetric.  Psychiatric:        Mood and Affect: Mood normal.           Assessment & Plan:   Problem List Items Addressed This Visit       Cardiovascular and Mediastinum   Essential hypertension     Controlled.  Continue amlodipine 5 mg daily.      Relevant Medications   metoprolol tartrate (LOPRESSOR) 25 MG tablet   Other Relevant Orders   Comprehensive metabolic panel   CBC   New onset atrial fibrillation ([place])    Noted on exam today, confirmed with ECG. Asymptomatic.   Checking labs today including TSH, CMP, CBC  Start metoprolol tartrate 12.5 mg BID. Chadsvasc score of 3 today for age and hypertension. Will initiate Eliquis once renal function returns.   Echocardiogram ordered and pending. Referral placed to cardiology.      Relevant Medications   metoprolol tartrate (LOPRESSOR) 25 MG tablet   Other Relevant Orders   Ambulatory referral to Cardiology   ECHOCARDIOGRAM COMPLETE   TSH     Digestive   GERD    Controlled.  Continue pantoprazole 40 mg daily.        Nervous and Auditory   Hearing loss    Chronic, ongoing.  No concerns today.         Other   Hyperlipidemia    Repeat lipid panel pending.  Continue simvastatin 40 mg daily.       Relevant Medications   metoprolol tartrate (LOPRESSOR) 25 MG tablet   Other Relevant Orders   Lipid panel   History of prostate cancer  Following with Urology, Dr. Jeffie Pollock. PSA is UTD.  He has follow up scheduled for 11/30/21. Continue tamsulosin 0.4 mg daily.       Relevant Orders   PSA, Medicare   Encounter for annual general medical examination with abnormal findings in adult - Primary    Discussed to obtain Shingrix vaccine from the pharmacy. Other vaccines UTD. PSA pending. Declines colonoscopy given age.  Discussed the importance of a healthy diet and regular exercise in order for weight loss, and to reduce the risk of further co-morbidity.  Exam today as noted. Labs pending.       Prediabetes    Discussed the importance of a healthy diet and regular exercise in order for weight loss, and to reduce the risk of further co-morbidity.  Repeat A1C pending.       Relevant Orders    Hemoglobin A1c   Irregular heart beats    Noted on exam today. Patient asymptomatic.  ECG today with irregular rhythm with rate of 91, no p-waves. Atrial fibrillation. Compared to ECG from 2022 and atrial fibrillation was not noted.   Echocardiogram and labs pending today. Need to update renal function.   Referral placed to cardiology for establishment and monitoring.  Chadsvasc score of 3 today for age and hypertension history.  Await renal function results. Anticipate Eliquis initiation.        Relevant Orders   EKG 12-Lead (Completed)       [name], NP

## 2021-11-20 NOTE — Assessment & Plan Note (Signed)
Controlled.  Continue amlodipine 5 mg daily.

## 2021-11-20 NOTE — Telephone Encounter (Signed)
Called and reviewed answer with [facility]. She is aware we will call once we have results.

## 2021-11-25 ENCOUNTER — Encounter: Payer: Self-pay | Admitting: Cardiology

## 2021-11-25 ENCOUNTER — Ambulatory Visit: Payer: Medicare Other | Admitting: Cardiology

## 2021-11-25 VITALS — BP 126/78 | HR 97 | Ht 70.0 in | Wt 214.6 lb

## 2021-11-25 DIAGNOSIS — I4891 Unspecified atrial fibrillation: Secondary | ICD-10-CM

## 2021-11-25 DIAGNOSIS — E7800 Pure hypercholesterolemia, unspecified: Secondary | ICD-10-CM | POA: Diagnosis not present

## 2021-11-25 DIAGNOSIS — I10 Essential (primary) hypertension: Secondary | ICD-10-CM

## 2021-11-25 DIAGNOSIS — Z9189 Other specified personal risk factors, not elsewhere classified: Secondary | ICD-10-CM | POA: Diagnosis not present

## 2021-11-25 MED ORDER — ATORVASTATIN CALCIUM 20 MG PO TABS: 20.0000 mg | ORAL_TABLET | Freq: Every day | ORAL | 3 refills | Status: DC

## 2021-11-25 MED ORDER — DILTIAZEM HCL ER COATED BEADS 180 MG PO CP24: 180.0000 mg | ORAL_CAPSULE | Freq: Every day | ORAL | 3 refills | Status: DC

## 2021-11-25 NOTE — Progress Notes (Signed)
Cardiology CONSULT Note    Date:  11/25/2021   ID:  Chad Huang, DOB 1940-10-20, MRN [id number]  PCP:  [name], NP  Cardiologist:  Fransico Him, MD   Chief Complaint  Patient presents with   New Patient (Initial Visit)    PAFlutter and HTN    History of Present Illness:  Chad Huang is a 81 y.o. male who is being seen today for the evaluation of atrial fibrillation at the request of [name], NP.  This is an 81yo male with a hx of GERD, ETOH abuse, anxiety and depression and prostate CA who was recently seen by his PCP and found to be in atrial fibrillation  He was not aware of his arrhythmia.  He was placed on Toprol Xl '25mg'$  daily and Eliquis and is now here for evaluation.  He denies any chest pain, SOB, DOE, LE edema, PND, orthopnea, dizziness, palpitations or syncope.   Past Medical History:  Diagnosis Date   Alcohol abuse    Anxiety and depression    charter, admission   Back pain    (Kritzer), microdiskectomy-great result   Benign prostatic hypertrophy    per Dr Jeffie Pollock with uro   Cataracts, bilateral    Cerumen impaction    left   Chest pain    GERD (gastroesophageal reflux disease)    Hearing loss    Hiatal hernia    nodule   HTN (hypertension)    Hyperlipidemia, mixed    Inguinal hernia    left, hx of, repair   Internal hemorrhoids    Left ear pain    Muscle disorder    disorder, muscle/ligaminet nis, rt lower extremity   Prostate cancer ([place]) 2010   Dr Jeffie Pollock   Skin cancer    hx, left ear    Past Surgical History:  Procedure Laterality Date   back microdiskectomy  10/01   Kritzer   BACK SURGERY     CARDIOVASCULAR STRESS TEST  2009   [place]   COLONOSCOPY  5/05   polyps int hemorrhoids, repeat 10 yrs   ESOPHAGOGASTRODUODENOSCOPY  03/02/00   gastritis, chronic   ESOPHAGOGASTRODUODENOSCOPY  5/05   polyps in antrum, neg H Pylori   ETT  05/19/07   Adenosine Muoview   H Pylori positive  8/98   HERNIA REPAIR     LAPAROSCOPIC  INGUINAL HERNIA REPAIR  10/96   levitin eval  02/05/99   hip pain   LUMBAR SPINE SURGERY  5/00   MRI  10/07/98   degen changes 9Dr. French Ana) steroid injections   MYELOGRAM  9/01   stenosis L4-5    Current Medications: Current Meds  Medication Sig   amLODipine (NORVASC) 5 MG tablet TAKE 1 TABLET BY MOUTH  DAILY FOR BLOOD PRESSURE   apixaban (ELIQUIS) 5 MG TABS tablet Take 1 tablet (5 mg total) by mouth 2 (two) times daily.   Ascorbic Acid (VITAMIN C) 1000 MG tablet Take 1,000 mg by mouth daily.   fluticasone (FLONASE) 50 MCG/ACT nasal spray PLACE 1 SPRAY INTO BOTH NOSTRILS 2 TIMES DAILY AS NEEDED FOR ALLERGIES OR RHINITIS.   metoprolol tartrate (LOPRESSOR) 25 MG tablet Take 0.5 tablets (12.5 mg total) by mouth 2 (two) times daily. For heart rhythm and rate.   pantoprazole (PROTONIX) 40 MG tablet TAKE 1 TABLET BY MOUTH  DAILY FOR HEARTBURN   simvastatin (ZOCOR) 40 MG tablet TAKE 1 TABLET BY MOUTH EVERY DAY FOR CHOLESTEROL   tamsulosin (FLOMAX) 0.4 MG  CAPS capsule Take 0.4 mg by mouth daily.    Allergies:   Augmentin [amoxicillin-pot clavulanate], Clarithromycin, Codeine phosphate, and Nabumetone   Social History   Socioeconomic History   Marital status: Widowed    Spouse name: Not on file   Number of children: Not on file   Years of education: Not on file   Highest education level: Not on file  Occupational History   Not on file  Tobacco Use   Smoking status: Former    Packs/day: 1.00    Years: 20.00    Total pack years: 20.00    Types: Cigarettes   Smokeless tobacco: Current    Types: Chew   Tobacco comments:    quit in 1973  Vaping Use   Vaping Use: Never used  Substance and Sexual Activity   Alcohol use: No    Comment: none since 1996   Drug use: No   Sexual activity: Not Currently  Other Topics Concern   Not on file  Social History Narrative   Married,    One daughter.    1 granddaughter.   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      [phone] (c?)    Social Determinants of Health   Financial Resource Strain: Low Risk  (10/16/2021)   Overall Financial Resource Strain (CARDIA)    Difficulty of Paying Living Expenses: Not hard at all  Food Insecurity: No Food Insecurity (10/16/2021)   Hunger Vital Sign    Worried About Running Out of Food in the Last Year: Never true    Ran Out of Food in the Last Year: Never true  Transportation Needs: No Transportation Needs (10/16/2021)   PRAPARE - Hydrologist (Medical): No    Lack of Transportation (Non-Medical): No  Physical Activity: Insufficiently Active (10/16/2021)   Exercise Vital Sign    Days of Exercise per Week: 2 days    Minutes of Exercise per Session: 20 min  Stress: No Stress Concern Present (10/16/2021)   [place]    Feeling of Stress : Not at all  Social Connections: Moderately Isolated (10/16/2021)   Social Connection and Isolation Panel [NHANES]    Frequency of Communication with Friends and Family: More than three times a week    Frequency of Social Gatherings with Friends and Family: More than three times a week    Attends Religious Services: More than 4 times per year    Active Member of [employer] or Organizations: No    Attends Archivist Meetings: Never    Marital Status: Widowed     Family History:  The patient's family history includes Heart disease in his brother; Lung cancer in his sister; Prostate cancer in his father; Stroke in an other family member.   ROS:   Please see the history of present illness.    ROS All other systems reviewed and are negative.      No data to display             PHYSICAL EXAM:   VS:  There were no vitals taken for this visit.   GEN: Well nourished, well developed, in no acute distress  HEENT: normal  Neck: no JVD, carotid bruits, or masses Cardiac: irregularly irregular; no murmurs, rubs, or gallops,no edema.  Intact distal pulses  bilaterally.  Respiratory:  clear to auscultation bilaterally, normal work of breathing GI: soft, nontender, nondistended, + BS MS: no deformity  or atrophy  Skin: warm and dry, no rash Neuro:  Alert and Oriented x 3, Strength and sensation are intact Psych: euthymic mood, full affect  Wt Readings from Last 3 Encounters:  11/20/21 217 lb (98.4 kg)  10/16/21 223 lb (101.2 kg)  11/19/20 223 lb (101.2 kg)      Studies/Labs Reviewed:   EKG:  EKG is ordered today.    Recent Labs: 11/20/2021: ALT 10; BUN 16; Creatinine, Ser 1.28; Hemoglobin 16.1; Platelets 169.0; Potassium 4.0; Sodium 140; TSH 1.69   Lipid Panel    Component Value Date/Time   CHOL 134 11/20/2021 0904   TRIG 119.0 11/20/2021 0904   HDL 32.60 (L) 11/20/2021 0904   CHOLHDL 4 11/20/2021 0904   VLDL 23.8 11/20/2021 0904   LDLCALC 78 11/20/2021 0904     CHA2DS2-VASc Score = 2  This indicates a 2.2% annual risk of stroke. The patient's score is based upon: CHF History: 0 HTN History: 0 Diabetes History: 0 Stroke History: 0 Vascular Disease History: 0 Age Score: 2 Gender Score: 0    Additional studies/ records that were reviewed today include:  OV notes from PCP    ASSESSMENT:    1. New onset atrial flutter ([place])   2. Essential hypertension   3. Pure hypercholesterolemia   4. Multiple risk factors for coronary artery disease      PLAN:  In order of problems listed above:  New onset atrial fibrillation -he is unaware of his arrhythmia so onset is unknown -he denies any palpitations and his HR is controlled -continue prescription drug management with Eliquis '5mg'$  BID with PRN refills -he is not tolerating the lopressor which makes him dizzy at times so I have told him to stop it -change Amlodipine to Cardizem CD '180mg'$  daily -EKG in [redacted] week along with BP check -check 2D echo -he will need to be on Eliquis with no missed doses for 3-4 weeks and then will set up for outpt DCCV  2.  HTN -BP controlled  on exam today -he is not tolerating the lopressor for his afib so will stop amlodipine and change to Cardizem CD '180mg'$  daily  3.  HLD -LDL goal < 100 unless he is found to have coronary artery calcifications and then his goal would be < 70.  -I have personally reviewed and interpreted outside labs performed by patient's PCP which showed LDL 78, HDL 32, TAG 119, ALT 10 on 11/20/21 -since we are starting Cardizem will need to change Simvastatin to Atorvastatin '20mg'$  daily -check FLP and ALT in 6 weeks  4.  Cardiac risk factors for CAD -he has a hx of HTN, HLD, remote tobacco use and fm hx of CAD -I will get a coronary Ca score to assess risk and help guide medical therapy  5.  Prostate Ca -he is seeing Urology next week and may need another bx -I told his daughter he is fine to be off Eliquis for bx and to let me know when it will be and when he goes back on Eliquis so we can start the 4 week count from there.  Time Spent: 20 minutes total time of encounter, including 15 minutes spent in face-to-face patient care on the date of this encounter. This time includes coordination of care and counseling regarding above mentioned problem list. Remainder of non-face-to-face time involved reviewing chart documents/testing relevant to the patient encounter and documentation in the medical record. I have independently reviewed documentation from referring provider  Medication Adjustments/Labs and Tests  Ordered: Current medicines are reviewed at length with the patient today.  Concerns regarding medicines are outlined above.  Medication changes, Labs and Tests ordered today are listed in the Patient Instructions below.  There are no Patient Instructions on file for this visit.   Signed, Fransico Him, MD  11/25/2021 7:50 AM    [address], [place], [place]  [address] Phone: [phone]; Fax: [phone]

## 2021-11-25 NOTE — Patient Instructions (Addendum)
Medication Instructions:  Your physician has recommended you make the following change in your medication:  1) STOP taking amlodipine 2) STOP taking metoprolol  3) STOP taking simvastatin 4) START taking Cardizem (diltiazem) 180 mg daily  5) START taking atorvastatin 40 mg  *If you need a refill on your cardiac medications before your next appointment, please call your pharmacy*  Labs: Fasting Lipids and ALT in 6 weeks   Testing/Procedures: Your physician has requested that you have an echocardiogram. Echocardiography is a painless test that uses sound waves to create images of your heart. It provides your doctor with information about the size and shape of your heart and how well your heart's chambers and valves are working. This procedure takes approximately one hour. There are no restrictions for this procedure.  Your physician has requested that you have a calcium score CT scan.    Follow-Up: At [facility], you and your health needs are our priority.  As part of our continuing mission to provide you with exceptional heart care, we have created designated Provider Care Teams.  These Care Teams include your primary Cardiologist (physician) and Advanced Practice Providers (APPs -  Physician Assistants and Nurse Practitioners) who all work together to provide you with the care you need, when you need it.  1 week for a nurse visit for an EKG  Your next appointment:   8/16 at 2:40pm   The format for your next appointment:   In Person  Provider:   Fransico Him, MD     Important Information About Sugar

## 2021-11-25 NOTE — Addendum Note (Signed)
Addended by: Antonieta Iba on: 11/25/2021 09:04 AM   Modules accepted: Orders

## 2021-12-04 ENCOUNTER — Ambulatory Visit (INDEPENDENT_AMBULATORY_CARE_PROVIDER_SITE_OTHER): Payer: Medicare Other | Admitting: *Deleted

## 2021-12-04 VITALS — BP 128/82 | HR 83 | Wt 221.6 lb

## 2021-12-04 DIAGNOSIS — I4891 Unspecified atrial fibrillation: Secondary | ICD-10-CM

## 2021-12-04 NOTE — Progress Notes (Signed)
   Nurse Visit   Date of Encounter: 12/04/2021 ID: Chad Huang, DOB [birth year], MRN [id number]  PCP:  [name], NP   [facility] Providers Cardiologist:  Fransico Him, MD      Visit Details   VS:  BP 128/82 (BP Location: Left Arm, Patient Position: Sitting, Cuff Size: Normal)   Pulse 83   Wt 221 lb 9.6 oz (100.5 kg)   BMI 31.80 kg/m  , BMI Body mass index is 31.8 kg/m.  Wt Readings from Last 3 Encounters:  12/04/21 221 lb 9.6 oz (100.5 kg)  11/25/21 214 lb 9.6 oz (97.3 kg)  11/20/21 217 lb (98.4 kg)     Reason for visit: EKG and BP check Performed today:   , Vitals, EKG, Provider consulted:Dr. Radford Pax (DOD), and Education Changes (medications, testing, etc.) : NO CHANGES Length of Visit: 15 minutes  Pt seen previously for new afib, was placed on Eliquis by PCP.  Metoprolol, amlodipine were stopped.  Started Cardizem CD 180 mg daily.  He reports feeling fine on the medication change.  EKG today is afib HR 83.  BP 128/82. Has follow up with Dr. Radford Pax 12/30/21.  Medications Adjustments/Labs and Tests Ordered: No orders of the defined types were placed in this encounter.  No orders of the defined types were placed in this encounter.    Alda Lea, RN  12/04/2021 11:19 AM

## 2021-12-10 ENCOUNTER — Other Ambulatory Visit: Payer: Self-pay | Admitting: Primary Care

## 2021-12-10 DIAGNOSIS — K219 Gastro-esophageal reflux disease without esophagitis: Secondary | ICD-10-CM

## 2021-12-11 ENCOUNTER — Ambulatory Visit (HOSPITAL_BASED_OUTPATIENT_CLINIC_OR_DEPARTMENT_OTHER): Payer: Medicare Other

## 2021-12-11 ENCOUNTER — Other Ambulatory Visit (HOSPITAL_COMMUNITY): Payer: Medicare Other

## 2021-12-11 ENCOUNTER — Ambulatory Visit (HOSPITAL_BASED_OUTPATIENT_CLINIC_OR_DEPARTMENT_OTHER)
Admission: RE | Admit: 2021-12-11 | Discharge: 2021-12-11 | Disposition: A | Discharge status: Home / Self Care | Payer: Medicare Other | Source: Ambulatory Visit | Attending: Cardiology | Admitting: Cardiology

## 2021-12-11 DIAGNOSIS — I4891 Unspecified atrial fibrillation: Secondary | ICD-10-CM

## 2021-12-11 DIAGNOSIS — I10 Essential (primary) hypertension: Secondary | ICD-10-CM | POA: Diagnosis not present

## 2021-12-11 DIAGNOSIS — Z9189 Other specified personal risk factors, not elsewhere classified: Secondary | ICD-10-CM | POA: Insufficient documentation

## 2021-12-11 DIAGNOSIS — E7800 Pure hypercholesterolemia, unspecified: Secondary | ICD-10-CM

## 2021-12-11 LAB — ECHOCARDIOGRAM COMPLETE: S' Lateral: 3.2 cm

## 2021-12-12 ENCOUNTER — Encounter: Payer: Self-pay | Admitting: Cardiology

## 2021-12-12 DIAGNOSIS — I77810 Thoracic aortic ectasia: Secondary | ICD-10-CM | POA: Insufficient documentation

## 2021-12-30 ENCOUNTER — Ambulatory Visit: Payer: Medicare Other | Admitting: Cardiology

## 2021-12-30 ENCOUNTER — Encounter: Payer: Self-pay | Admitting: Cardiology

## 2021-12-30 VITALS — BP 130/98 | HR 76 | Ht 70.0 in | Wt 222.0 lb

## 2021-12-30 DIAGNOSIS — E7800 Pure hypercholesterolemia, unspecified: Secondary | ICD-10-CM

## 2021-12-30 DIAGNOSIS — Z9189 Other specified personal risk factors, not elsewhere classified: Secondary | ICD-10-CM | POA: Diagnosis not present

## 2021-12-30 DIAGNOSIS — I10 Essential (primary) hypertension: Secondary | ICD-10-CM | POA: Diagnosis not present

## 2021-12-30 DIAGNOSIS — I4891 Unspecified atrial fibrillation: Secondary | ICD-10-CM | POA: Diagnosis not present

## 2021-12-30 NOTE — Progress Notes (Signed)
156/50 complete that and that great he is still on  Cardiology office note    Date:  12/30/2021   ID:  Chad Huang, DOB 1941-01-26, MRN [id number]  PCP:  Chad Koch, NP  Cardiologist:  Chad Him, MD   Chief Complaint  Patient presents with   Atrial Fibrillation   Hypertension   Hyperlipidemia    History of Present Illness:  Chad Huang is a 81 y.o. male with a hx of GERD, ETOH abuse, anxiety and depression and prostate CA who was recently seen by his PCP and found to be in atrial fibrillation  He was not aware of his arrhythmia.  He was placed on Toprol Xl '25mg'$  daily and Eliquis and referred for further evaluation with cardiology.  He was asymptomatic when I initially saw Huang but was not tolerating his Lopressor due to dizzy spells.  I stopped his Lopressor as well as amlodipine and started Cardizem CD 180 mg daily.  We also stopped his simvastatin and started atorvastatin since there is interaction between simvastatin and Cardizem.  Coronary calcium score was done which was 0.   2D echo showed normal LV function with EF 55 to 60% with mild LVH.  Diastolic function could not be assessed due to underlying atrial fibrillation.  The aortic root was mildly dilated at 40 mm.  He is now back today for follow-up.  He is here today for followup and is doing well.  He denies any chest pain or pressure, SOB, DOE, PND, orthopnea, LE edema, dizziness, palpitations or syncope. He is compliant with his meds and is tolerating meds with no SE.    Past Medical History:  Diagnosis Date   Alcohol abuse    Anxiety and depression    charter, admission   Back pain    ([place]), microdiskectomy-great result   Benign prostatic hypertrophy    per Dr Jeffie Pollock with uro   Cataracts, bilateral    Cerumen impaction    left   Chest pain    Dilated aortic root (HCC)    40 mm by echo 11/2021   GERD (gastroesophageal reflux disease)    Hearing loss    Hiatal hernia    nodule   HTN (hypertension)     Hyperlipidemia, mixed    Inguinal hernia    left, hx of, repair   Internal hemorrhoids    Left ear pain    Muscle disorder    disorder, muscle/ligaminet nis, rt lower extremity   Prostate cancer ([place]) 05/17/2008   Dr Jeffie Pollock   Skin cancer    hx, left ear    Past Surgical History:  Procedure Laterality Date   back microdiskectomy  10/01   [place]   BACK SURGERY     CARDIOVASCULAR STRESS TEST  2009   [place]   COLONOSCOPY  5/05   polyps int hemorrhoids, repeat 10 yrs   ESOPHAGOGASTRODUODENOSCOPY  03/02/00   gastritis, chronic   ESOPHAGOGASTRODUODENOSCOPY  5/05   polyps in antrum, neg H Pylori   ETT  05/19/07   Adenosine Muoview   H Pylori positive  8/98   HERNIA REPAIR     LAPAROSCOPIC INGUINAL HERNIA REPAIR  10/96   levitin eval  02/05/99   hip pain   LUMBAR SPINE SURGERY  5/00   MRI  10/07/98   degen changes 9Dr. French Ana) steroid injections   MYELOGRAM  9/01   stenosis L4-5    Current Medications: Current Meds  Medication Sig   apixaban (ELIQUIS) 5 MG  TABS tablet Take 1 tablet (5 mg total) by mouth 2 (two) times daily.   Ascorbic Acid (VITAMIN C) 1000 MG tablet Take 1,000 mg by mouth daily.   atorvastatin (LIPITOR) 20 MG tablet Take 1 tablet (20 mg total) by mouth daily.   fluticasone (FLONASE) 50 MCG/ACT nasal spray PLACE 1 SPRAY INTO BOTH NOSTRILS 2 TIMES DAILY AS NEEDED FOR ALLERGIES OR RHINITIS.   pantoprazole (PROTONIX) 40 MG tablet TAKE 1 TABLET BY MOUTH  DAILY FOR HEARTBURN   tamsulosin (FLOMAX) 0.4 MG CAPS capsule Take 0.4 mg by mouth daily.   [DISCONTINUED] diltiazem (CARDIZEM CD) 180 MG 24 hr capsule Take 1 capsule (180 mg total) by mouth daily.    Allergies:   Augmentin [amoxicillin-pot clavulanate], Clarithromycin, Codeine phosphate, and Nabumetone   Social History   Socioeconomic History   Marital status: Widowed    Spouse name: Not on file   Number of children: Not on file   Years of education: Not on file   Highest education level: Not on file   Occupational History   Not on file  Tobacco Use   Smoking status: Former    Packs/day: 1.00    Years: 20.00    Total pack years: 20.00    Types: Cigarettes   Smokeless tobacco: Current    Types: Chew   Tobacco comments:    quit in 1973  Vaping Use   Vaping Use: Never used  Substance and Sexual Activity   Alcohol use: No    Comment: none since 1996   Drug use: No   Sexual activity: Not Currently  Other Topics Concern   Not on file  Social History Narrative   Married,    One daughter.    1 granddaughter.   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      [phone] (c?)   Social Determinants of Health   Financial Resource Strain: Low Risk  (10/16/2021)   Overall Financial Resource Strain (CARDIA)    Difficulty of Paying Living Expenses: Not hard at all  Food Insecurity: No Food Insecurity (10/16/2021)   Hunger Vital Sign    Worried About Running Out of Food in the Last Year: Never true    Ran Out of Food in the Last Year: Never true  Transportation Needs: No Transportation Needs (10/16/2021)   PRAPARE - Hydrologist (Medical): No    Lack of Transportation (Non-Medical): No  Physical Activity: Insufficiently Active (10/16/2021)   Exercise Vital Sign    Days of Exercise per Week: 2 days    Minutes of Exercise per Session: 20 min  Stress: No Stress Concern Present (10/16/2021)   [place]    Feeling of Stress : Not at all  Social Connections: Moderately Isolated (10/16/2021)   Social Connection and Isolation Panel [NHANES]    Frequency of Communication with Friends and Family: More than three times a week    Frequency of Social Gatherings with Friends and Family: More than three times a week    Attends Religious Services: More than 4 times per year    Active Member of [employer] or Organizations: No    Attends Archivist Meetings: Never    Marital Status: Widowed      Family History:  The patient's family history includes Heart disease in his brother; Lung cancer in his sister; Prostate cancer in his father; Stroke in an other family member.   ROS:  Please see the history of present illness.    ROS All other systems reviewed and are negative.      No data to display             PHYSICAL EXAM:   VS:  BP (!) 130/98   Pulse 76   Ht '5\' 10"'$  (1.778 m)   Wt 222 lb (100.7 kg)   BMI 31.85 kg/m     Today's Vitals   12/30/21 1431 12/30/21 1439  BP: (!) 150/100 (!) 130/98  Pulse: 76   Weight: 222 lb (100.7 kg)   Height: '5\' 10"'$  (1.778 m)    Body mass index is 31.85 kg/m.   GEN: Well nourished, well developed in no acute distress HEENT: Normal NECK: No JVD; No carotid bruits LYMPHATICS: No lymphadenopathy CARDIAC: Irregularly irregular, no murmurs, rubs, gallops RESPIRATORY:  Clear to auscultation without rales, wheezing or rhonchi  ABDOMEN: Soft, non-tender, non-distended MUSCULOSKELETAL:  No edema; No deformity  SKIN: Warm and dry NEUROLOGIC:  Alert and oriented x 3 PSYCHIATRIC:  Normal affect   Wt Readings from Last 3 Encounters:  12/30/21 222 lb (100.7 kg)  12/04/21 221 lb 9.6 oz (100.5 kg)  11/25/21 214 lb 9.6 oz (97.3 kg)      Studies/Labs Reviewed:   EKG:  EKG is not ordered today.    Recent Labs: 11/20/2021: ALT 10; BUN 16; Creatinine, Ser 1.28; Hemoglobin 16.1; Platelets 169.0; Potassium 4.0; Sodium 140; TSH 1.69   Lipid Panel    Component Value Date/Time   CHOL 134 11/20/2021 0904   TRIG 119.0 11/20/2021 0904   HDL 32.60 (L) 11/20/2021 0904   CHOLHDL 4 11/20/2021 0904   VLDL 23.8 11/20/2021 0904   LDLCALC 78 11/20/2021 0904     CHA2DS2-VASc Score = 2  This indicates a 2.2% annual risk of stroke. The patient's score is based upon: CHF History: 0 HTN History: 0 Diabetes History: 0 Stroke History: 0 Vascular Disease History: 0 Age Score: 2 Gender Score: 0    Additional studies/ records that were  reviewed today include:  OV notes from PCP    ASSESSMENT:    1. New onset atrial fibrillation ([place])   2. Essential hypertension   3. Pure hypercholesterolemia   4. Multiple risk factors for coronary artery disease      PLAN:  In order of problems listed above:  New onset atrial fibrillation -he is unaware of his arrhythmia so onset is unknown -he denies any palpitations and his HR is controlled -He did not tolerate Lopressor due to dizziness so at last office visit his Lopressor was stopped as well as amlodipine and he was changed to Cardizem CD 180 mg daily which he is tolerating -2D echo showed normal LV function -Continue prescription drug management with Cardizem CD 180 mg daily and Eliquis 5 mg twice daily with as needed refills -I have personally reviewed and interpreted outside labs performed by patient's PCP which showed hemoglobin 16.1 and creatinine 1.28 on 11/20/2021 -He remains in atrial fibrillation on exam today and has not missed any doses of Eliquis so we will set Huang up for outpatient cardioversion -Shared Decision Making/Informed Consent The risks (stroke, cardiac arrhythmias rarely resulting in the need for a temporary or permanent pacemaker, skin irritation or burns and complications associated with conscious sedation including aspiration, arrhythmia, respiratory failure and death), benefits (restoration of normal sinus rhythm) and alternatives of a direct current cardioversion were explained in detail to Mr. Higbie and he agrees to proceed.  2.  HTN -BP is poorly controlled on exam today -he is using a lot of table salt and salty snacks -I had a long discussion today with Huang and his daughter about following a strict <2gm Na diet and how to read labels for Na.  I also instructed Huang to cut out all added salt in his diet and use Mrs. Dash salt substitute -continue prescription drug therapy with Cardizem CD '180mg'$  daily -will get a 48 hour BP monitor to assess BP  control with dietary changes with Na prior to increasing Cardizem further  3.  HLD -LDL goal < 100 >> coronary calcium score was 0 -I have personally reviewed and interpreted outside labs performed by patient's PCP which showed LDL 78, HDL 32, TAG 119, ALT 10 on 11/20/21 -At last office visit his simvastatin was changed to atorvastatin 20 mg daily because of the interaction between simvastatin and Cardizem -He has a repeat FLP and ALT coming up soon  4.  Cardiac risk factors for CAD -he has a hx of HTN, HLD, remote tobacco use and fm hx of CAD -Coronary Score was 0   Time Spent: 20 minutes total time of encounter, including 15 minutes spent in face-to-face patient care on the date of this encounter. This time includes coordination of care and counseling regarding above mentioned problem list. Remainder of non-face-to-face time involved reviewing chart documents/testing relevant to the patient encounter and documentation in the medical record. I have independently reviewed documentation from referring provider  Medication Adjustments/Labs and Tests Ordered: Current medicines are reviewed at length with the patient today.  Concerns regarding medicines are outlined above.  Medication changes, Labs and Tests ordered today are listed in the Patient Instructions below.  Patient Instructions  Medication Instructions:  Your physician recommends that you continue on your current medications as directed. Please refer to the Current Medication list given to you today.  *If you need a refill on your cardiac medications before your next appointment, please call your pharmacy*   Lab Work: BMET and CBC If you have labs (blood work) drawn today and your tests are completely normal, you will receive your results only by: [place] (if you have MyChart) OR A paper copy in the mail If you have any lab test that is abnormal or we need to change your treatment, we will call you to review the  results.   Testing/Procedures: Your physician has recommended that you have a Cardioversion (DCCV). Electrical Cardioversion uses a jolt of electricity to your heart either through paddles or wired patches attached to your chest. This is a controlled, usually prescheduled, procedure. Defibrillation is done under light anesthesia in the hospital, and you usually go home the day of the procedure. This is done to get your heart back into a normal rhythm. You are not awake for the procedure. Please see the instruction sheet given to you today.   Follow-Up: At [facility], you and your health needs are our priority.  As part of our continuing mission to provide you with exceptional heart care, we have created designated Provider Care Teams.  These Care Teams include your primary Cardiologist (physician) and Advanced Practice Providers (APPs -  Physician Assistants and Nurse Practitioners) who all work together to provide you with the care you need, when you need it.  Your next appointment:     The format for your next appointment:   In Person  Provider:   Fransico Him, MD {   Cardioversion Instructions: You are  scheduled for a Cardioversion on 7/28 with Dr. Radford Pax. Please arrive at the [facility] (Main Entrance A) at [facility]: [address] [place], [place] [address] at 6:30am. (1 hour prior to procedure unless lab work is needed; if lab work is needed arrive 1.5 hours ahead)  DIET: Nothing to eat or drink after midnight except a sip of water with medications (see medication instructions below)  FYI: For your safety, and to allow [place] to monitor your vital signs accurately during the surgery/procedure we request that   if you have artificial nails, gel coating, SNS etc. Please have those removed prior to your surgery/procedure. Not having the nail coverings /polish removed may result in cancellation or delay of your surgery/procedure.  Medication Instructions:  Continue your  anticoagulant: Eliquis You will need to continue your anticoagulant after your procedure until you are told by your Provider that it is safe to stop  Labs: Come to: Come to the lab at [facility] between the hours of 7:15 am and 5:00 pm.   You must have a responsible person to drive you home and stay in the waiting area during your procedure. Failure to do so could result in cancellation.  Bring your insurance cards.  *Special Note: Every effort is made to have your procedure done on time. Occasionally there are emergencies that occur at the hospital that may cause delays. Please be patient if a delay does occur.      Low-Sodium Eating Plan Sodium, which is an element that makes up salt, helps you maintain a healthy balance of fluids in your body. Too much sodium can increase your blood pressure and cause fluid and waste to be held in your body. Your health care provider or dietitian may recommend following this plan if you have high blood pressure (hypertension), kidney disease, liver disease, or heart failure. Eating less sodium can help lower your blood pressure, reduce swelling, and protect your heart, liver, and kidneys. What are tips for following this plan? Reading food labels The Nutrition Facts label lists the amount of sodium in one serving of the food. If you eat more than one serving, you must multiply the listed amount of sodium by the number of servings. Choose foods with less than 140 mg of sodium per serving. Avoid foods with 300 mg of sodium or more per serving. Shopping  Look for lower-sodium products, often labeled as "low-sodium" or "no salt added." Always check the sodium content, even if foods are labeled as "unsalted" or "no salt added." Buy fresh foods. Avoid canned foods and pre-made or frozen meals. Avoid canned, cured, or processed meats. Buy breads that have less than 80 mg of sodium per slice. Cooking  Eat more home-cooked food and less restaurant,  buffet, and fast food. Avoid adding salt when cooking. Use salt-free seasonings or herbs instead of table salt or sea salt. Check with your health care provider or pharmacist before using salt substitutes. Cook with plant-based oils, such as canola, sunflower, or olive oil. Meal planning When eating at a restaurant, ask that your food be prepared with less salt or no salt, if possible. Avoid dishes labeled as brined, pickled, cured, smoked, or made with soy sauce, miso, or teriyaki sauce. Avoid foods that contain MSG (monosodium glutamate). MSG is sometimes added to [place] food, bouillon, and some canned foods. Make meals that can be grilled, baked, poached, roasted, or steamed. These are generally made with less sodium. General information Most people on this  plan should limit their sodium intake to 1,500-2,000 mg (milligrams) of sodium each day. What foods should I eat? Fruits Fresh, frozen, or canned fruit. Fruit juice. Vegetables Fresh or frozen vegetables. "No salt added" canned vegetables. "No salt added" tomato sauce and paste. Low-sodium or reduced-sodium tomato and vegetable juice. Grains Low-sodium cereals, including oats, puffed wheat and rice, and shredded wheat. Low-sodium crackers. Unsalted rice. Unsalted pasta. Low-sodium bread. Whole-grain breads and whole-grain pasta. Meats and other proteins Fresh or frozen (no salt added) meat, poultry, seafood, and fish. Low-sodium canned tuna and salmon. Unsalted nuts. Dried peas, beans, and lentils without added salt. Unsalted canned beans. Eggs. Unsalted nut butters. Dairy Milk. Soy milk. Cheese that is naturally low in sodium, such as ricotta cheese, fresh mozzarella, or Swiss cheese. Low-sodium or reduced-sodium cheese. Cream cheese. Yogurt. Seasonings and condiments Fresh and dried herbs and spices. Salt-free seasonings. Low-sodium mustard and ketchup. Sodium-free salad dressing. Sodium-free light mayonnaise. Fresh or refrigerated  horseradish. Lemon juice. Vinegar. Other foods Homemade, reduced-sodium, or low-sodium soups. Unsalted popcorn and pretzels. Low-salt or salt-free chips. The items listed above may not be a complete list of foods and beverages you can eat. Contact a dietitian for more information. What foods should I avoid? Vegetables Sauerkraut, pickled vegetables, and relishes. Olives. [place] fries. Onion rings. Regular canned vegetables (not low-sodium or reduced-sodium). Regular canned tomato sauce and paste (not low-sodium or reduced-sodium). Regular tomato and vegetable juice (not low-sodium or reduced-sodium). Frozen vegetables in sauces. Grains Instant hot cereals. Bread stuffing, pancake, and biscuit mixes. Croutons. Seasoned rice or pasta mixes. Noodle soup cups. Boxed or frozen macaroni and cheese. Regular salted crackers. Self-rising flour. Meats and other proteins Meat or fish that is salted, canned, smoked, spiced, or pickled. Precooked or cured meat, such as sausages or meat loaves. Berniece Salines. Ham. Pepperoni. Hot dogs. Corned beef. Chipped beef. Salt pork. Jerky. Pickled herring. Anchovies and sardines. Regular canned tuna. Salted nuts. Dairy Processed cheese and cheese spreads. Hard cheeses. Cheese curds. Blue cheese. Feta cheese. String cheese. Regular cottage cheese. Buttermilk. Canned milk. Fats and oils Salted butter. Regular margarine. Ghee. Bacon fat. Seasonings and condiments Onion salt, garlic salt, seasoned salt, table salt, and sea salt. Canned and packaged gravies. Worcestershire sauce. Tartar sauce. Barbecue sauce. Teriyaki sauce. Soy sauce, including reduced-sodium. Steak sauce. Fish sauce. Oyster sauce. Cocktail sauce. Horseradish that you find on the shelf. Regular ketchup and mustard. Meat flavorings and tenderizers. Bouillon cubes. Hot sauce. Pre-made or packaged marinades. Pre-made or packaged taco seasonings. Relishes. Regular salad dressings. Salsa. Other foods Salted popcorn and  pretzels. Corn chips and puffs. Potato and tortilla chips. Canned or dried soups. Pizza. Frozen entrees and pot pies. The items listed above may not be a complete list of foods and beverages you should avoid. Contact a dietitian for more information. Summary Eating less sodium can help lower your blood pressure, reduce swelling, and protect your heart, liver, and kidneys. Most people on this plan should limit their sodium intake to 1,500-2,000 mg (milligrams) of sodium each day. Canned, boxed, and frozen foods are high in sodium. Restaurant foods, fast foods, and pizza are also very high in sodium. You also get sodium by adding salt to food. Try to cook at home, eat more fresh fruits and vegetables, and eat less fast food and canned, processed, or prepared foods. This information is not intended to replace advice given to you by your health care provider. Make sure you discuss any questions you have with your health care provider. Document  Revised: 06/08/2019 Document Reviewed: 04/04/2019 Elsevier Patient Education  [place], Chad Him, MD  12/30/2021 4:25 PM    [facility] [place], [place], [place]  [address] Phone: [phone]; Fax: [phone]

## 2021-12-30 NOTE — Patient Instructions (Addendum)
Medication Instructions:  Your physician recommends that you continue on your current medications as directed. Please refer to the Current Medication list given to you today.  *If you need a refill on your cardiac medications before your next appointment, please call your pharmacy*   Lab Work: BMET and CBC If you have labs (blood work) drawn today and your tests are completely normal, you will receive your results only by: [place] (if you have MyChart) OR A paper copy in the mail If you have any lab test that is abnormal or we need to change your treatment, we will call you to review the results.   Testing/Procedures: Your physician has recommended that you have a Cardioversion (DCCV). Electrical Cardioversion uses a jolt of electricity to your heart either through paddles or wired patches attached to your chest. This is a controlled, usually prescheduled, procedure. Defibrillation is done under light anesthesia in the hospital, and you usually go home the day of the procedure. This is done to get your heart back into a normal rhythm. You are not awake for the procedure. Please see the instruction sheet given to you today.   Follow-Up: At [facility], you and your health needs are our priority.  As part of our continuing mission to provide you with exceptional heart care, we have created designated Provider Care Teams.  These Care Teams include your primary Cardiologist (physician) and Advanced Practice Providers (APPs -  Physician Assistants and Nurse Practitioners) who all work together to provide you with the care you need, when you need it.  Your next appointment:     The format for your next appointment:   In Person  Provider:   Fransico Him, MD {   Cardioversion Instructions: You are scheduled for a Cardioversion on 7/28 with Dr. Radford Pax. Please arrive at the [facility] (Main Entrance A) at [facility]: [address] [place], [place] [address] at 6:30am. (1  hour prior to procedure unless lab work is needed; if lab work is needed arrive 1.5 hours ahead)  DIET: Nothing to eat or drink after midnight except a sip of water with medications (see medication instructions below)  FYI: For your safety, and to allow [place] to monitor your vital signs accurately during the surgery/procedure we request that   if you have artificial nails, gel coating, SNS etc. Please have those removed prior to your surgery/procedure. Not having the nail coverings /polish removed may result in cancellation or delay of your surgery/procedure.  Medication Instructions:  Continue your anticoagulant: Eliquis You will need to continue your anticoagulant after your procedure until you are told by your Provider that it is safe to stop  Labs: Come to: Come to the lab at [facility] between the hours of 7:15 am and 5:00 pm.   You must have a responsible person to drive you home and stay in the waiting area during your procedure. Failure to do so could result in cancellation.  Bring your insurance cards.  *Special Note: Every effort is made to have your procedure done on time. Occasionally there are emergencies that occur at the hospital that may cause delays. Please be patient if a delay does occur.      Low-Sodium Eating Plan Sodium, which is an element that makes up salt, helps you maintain a healthy balance of fluids in your body. Too much sodium can increase your blood pressure and cause fluid and waste to be held in your body. Your health care provider  or dietitian may recommend following this plan if you have high blood pressure (hypertension), kidney disease, liver disease, or heart failure. Eating less sodium can help lower your blood pressure, reduce swelling, and protect your heart, liver, and kidneys. What are tips for following this plan? Reading food labels The Nutrition Facts label lists the amount of sodium in one serving of the food. If you eat more than one  serving, you must multiply the listed amount of sodium by the number of servings. Choose foods with less than 140 mg of sodium per serving. Avoid foods with 300 mg of sodium or more per serving. Shopping  Look for lower-sodium products, often labeled as "low-sodium" or "no salt added." Always check the sodium content, even if foods are labeled as "unsalted" or "no salt added." Buy fresh foods. Avoid canned foods and pre-made or frozen meals. Avoid canned, cured, or processed meats. Buy breads that have less than 80 mg of sodium per slice. Cooking  Eat more home-cooked food and less restaurant, buffet, and fast food. Avoid adding salt when cooking. Use salt-free seasonings or herbs instead of table salt or sea salt. Check with your health care provider or pharmacist before using salt substitutes. Cook with plant-based oils, such as canola, sunflower, or olive oil. Meal planning When eating at a restaurant, ask that your food be prepared with less salt or no salt, if possible. Avoid dishes labeled as brined, pickled, cured, smoked, or made with soy sauce, miso, or teriyaki sauce. Avoid foods that contain MSG (monosodium glutamate). MSG is sometimes added to [place] food, bouillon, and some canned foods. Make meals that can be grilled, baked, poached, roasted, or steamed. These are generally made with less sodium. General information Most people on this plan should limit their sodium intake to 1,500-2,000 mg (milligrams) of sodium each day. What foods should I eat? Fruits Fresh, frozen, or canned fruit. Fruit juice. Vegetables Fresh or frozen vegetables. "No salt added" canned vegetables. "No salt added" tomato sauce and paste. Low-sodium or reduced-sodium tomato and vegetable juice. Grains Low-sodium cereals, including oats, puffed wheat and rice, and shredded wheat. Low-sodium crackers. Unsalted rice. Unsalted pasta. Low-sodium bread. Whole-grain breads and whole-grain pasta. Meats and  other proteins Fresh or frozen (no salt added) meat, poultry, seafood, and fish. Low-sodium canned tuna and salmon. Unsalted nuts. Dried peas, beans, and lentils without added salt. Unsalted canned beans. Eggs. Unsalted nut butters. Dairy Milk. Soy milk. Cheese that is naturally low in sodium, such as ricotta cheese, fresh mozzarella, or Swiss cheese. Low-sodium or reduced-sodium cheese. Cream cheese. Yogurt. Seasonings and condiments Fresh and dried herbs and spices. Salt-free seasonings. Low-sodium mustard and ketchup. Sodium-free salad dressing. Sodium-free light mayonnaise. Fresh or refrigerated horseradish. Lemon juice. Vinegar. Other foods Homemade, reduced-sodium, or low-sodium soups. Unsalted popcorn and pretzels. Low-salt or salt-free chips. The items listed above may not be a complete list of foods and beverages you can eat. Contact a dietitian for more information. What foods should I avoid? Vegetables Sauerkraut, pickled vegetables, and relishes. Olives. [place] fries. Onion rings. Regular canned vegetables (not low-sodium or reduced-sodium). Regular canned tomato sauce and paste (not low-sodium or reduced-sodium). Regular tomato and vegetable juice (not low-sodium or reduced-sodium). Frozen vegetables in sauces. Grains Instant hot cereals. Bread stuffing, pancake, and biscuit mixes. Croutons. Seasoned rice or pasta mixes. Noodle soup cups. Boxed or frozen macaroni and cheese. Regular salted crackers. Self-rising flour. Meats and other proteins Meat or fish that is salted, canned, smoked, spiced, or pickled. Precooked or  cured meat, such as sausages or meat loaves. Berniece Salines. Ham. Pepperoni. Hot dogs. Corned beef. Chipped beef. Salt pork. Jerky. Pickled herring. Anchovies and sardines. Regular canned tuna. Salted nuts. Dairy Processed cheese and cheese spreads. Hard cheeses. Cheese curds. Blue cheese. Feta cheese. String cheese. Regular cottage cheese. Buttermilk. Canned milk. Fats and  oils Salted butter. Regular margarine. Ghee. Bacon fat. Seasonings and condiments Onion salt, garlic salt, seasoned salt, table salt, and sea salt. Canned and packaged gravies. Worcestershire sauce. Tartar sauce. Barbecue sauce. Teriyaki sauce. Soy sauce, including reduced-sodium. Steak sauce. Fish sauce. Oyster sauce. Cocktail sauce. Horseradish that you find on the shelf. Regular ketchup and mustard. Meat flavorings and tenderizers. Bouillon cubes. Hot sauce. Pre-made or packaged marinades. Pre-made or packaged taco seasonings. Relishes. Regular salad dressings. Salsa. Other foods Salted popcorn and pretzels. Corn chips and puffs. Potato and tortilla chips. Canned or dried soups. Pizza. Frozen entrees and pot pies. The items listed above may not be a complete list of foods and beverages you should avoid. Contact a dietitian for more information. Summary Eating less sodium can help lower your blood pressure, reduce swelling, and protect your heart, liver, and kidneys. Most people on this plan should limit their sodium intake to 1,500-2,000 mg (milligrams) of sodium each day. Canned, boxed, and frozen foods are high in sodium. Restaurant foods, fast foods, and pizza are also very high in sodium. You also get sodium by adding salt to food. Try to cook at home, eat more fresh fruits and vegetables, and eat less fast food and canned, processed, or prepared foods. This information is not intended to replace advice given to you by your health care provider. Make sure you discuss any questions you have with your health care provider. Document Revised: 06/08/2019 Document Reviewed: 04/04/2019 Elsevier Patient Education  [place].

## 2021-12-30 NOTE — H&P (View-Only) (Signed)
156/50 complete that and that great he is still on  Cardiology office note    Date:  12/30/2021   ID:  Chad Huang, DOB Jun 13, 1940, MRN [id number]  PCP:  Chad Koch, NP  Cardiologist:  Chad Him, MD   Chief Complaint  Patient presents with   Atrial Fibrillation   Hypertension   Hyperlipidemia    History of Present Illness:  Chad Huang is a 81 y.o. male with a hx of GERD, ETOH abuse, anxiety and depression and prostate CA who was recently seen by his PCP and found to be in atrial fibrillation  He was not aware of his arrhythmia.  He was placed on Toprol Xl '25mg'$  daily and Eliquis and referred for further evaluation with cardiology.  He was asymptomatic when I initially saw Huang but was not tolerating his Lopressor due to dizzy spells.  I stopped his Lopressor as well as amlodipine and started Cardizem CD 180 mg daily.  We also stopped his simvastatin and started atorvastatin since there is interaction between simvastatin and Cardizem.  Coronary calcium score was done which was 0.   2D echo showed normal LV function with EF 55 to 60% with mild LVH.  Diastolic function could not be assessed due to underlying atrial fibrillation.  The aortic root was mildly dilated at 40 mm.  He is now back today for follow-up.  He is here today for followup and is doing well.  He denies any chest pain or pressure, SOB, DOE, PND, orthopnea, LE edema, dizziness, palpitations or syncope. He is compliant with his meds and is tolerating meds with no SE.    Past Medical History:  Diagnosis Date   Alcohol abuse    Anxiety and depression    charter, admission   Back pain    ([place]), microdiskectomy-great result   Benign prostatic hypertrophy    per Dr Jeffie Pollock with uro   Cataracts, bilateral    Cerumen impaction    left   Chest pain    Dilated aortic root (HCC)    40 mm by echo 11/2021   GERD (gastroesophageal reflux disease)    Hearing loss    Hiatal hernia    nodule   HTN (hypertension)     Hyperlipidemia, mixed    Inguinal hernia    left, hx of, repair   Internal hemorrhoids    Left ear pain    Muscle disorder    disorder, muscle/ligaminet nis, rt lower extremity   Prostate cancer ([place]) 05/17/2008   Dr Jeffie Pollock   Skin cancer    hx, left ear    Past Surgical History:  Procedure Laterality Date   back microdiskectomy  10/01   [place]   BACK SURGERY     CARDIOVASCULAR STRESS TEST  2009   [place]   COLONOSCOPY  5/05   polyps int hemorrhoids, repeat 10 yrs   ESOPHAGOGASTRODUODENOSCOPY  03/02/00   gastritis, chronic   ESOPHAGOGASTRODUODENOSCOPY  5/05   polyps in antrum, neg H Pylori   ETT  05/19/07   Adenosine Muoview   H Pylori positive  8/98   HERNIA REPAIR     LAPAROSCOPIC INGUINAL HERNIA REPAIR  10/96   levitin eval  02/05/99   hip pain   LUMBAR SPINE SURGERY  5/00   MRI  10/07/98   degen changes 9Dr. French Ana) steroid injections   MYELOGRAM  9/01   stenosis L4-5    Current Medications: Current Meds  Medication Sig   apixaban (ELIQUIS) 5 MG  TABS tablet Take 1 tablet (5 mg total) by mouth 2 (two) times daily.   Ascorbic Acid (VITAMIN C) 1000 MG tablet Take 1,000 mg by mouth daily.   atorvastatin (LIPITOR) 20 MG tablet Take 1 tablet (20 mg total) by mouth daily.   fluticasone (FLONASE) 50 MCG/ACT nasal spray PLACE 1 SPRAY INTO BOTH NOSTRILS 2 TIMES DAILY AS NEEDED FOR ALLERGIES OR RHINITIS.   pantoprazole (PROTONIX) 40 MG tablet TAKE 1 TABLET BY MOUTH  DAILY FOR HEARTBURN   tamsulosin (FLOMAX) 0.4 MG CAPS capsule Take 0.4 mg by mouth daily.   [DISCONTINUED] diltiazem (CARDIZEM CD) 180 MG 24 hr capsule Take 1 capsule (180 mg total) by mouth daily.    Allergies:   Augmentin [amoxicillin-pot clavulanate], Clarithromycin, Codeine phosphate, and Nabumetone   Social History   Socioeconomic History   Marital status: Widowed    Spouse name: Not on file   Number of children: Not on file   Years of education: Not on file   Highest education level: Not on file   Occupational History   Not on file  Tobacco Use   Smoking status: Former    Packs/day: 1.00    Years: 20.00    Total pack years: 20.00    Types: Cigarettes   Smokeless tobacco: Current    Types: Chew   Tobacco comments:    quit in 1973  Vaping Use   Vaping Use: Never used  Substance and Sexual Activity   Alcohol use: No    Comment: none since 1996   Drug use: No   Sexual activity: Not Currently  Other Topics Concern   Not on file  Social History Narrative   Married,    One daughter.    1 granddaughter.   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      [phone] (c?)   Social Determinants of Health   Financial Resource Strain: Low Risk  (10/16/2021)   Overall Financial Resource Strain (CARDIA)    Difficulty of Paying Living Expenses: Not hard at all  Food Insecurity: No Food Insecurity (10/16/2021)   Hunger Vital Sign    Worried About Running Out of Food in the Last Year: Never true    Ran Out of Food in the Last Year: Never true  Transportation Needs: No Transportation Needs (10/16/2021)   PRAPARE - Hydrologist (Medical): No    Lack of Transportation (Non-Medical): No  Physical Activity: Insufficiently Active (10/16/2021)   Exercise Vital Sign    Days of Exercise per Week: 2 days    Minutes of Exercise per Session: 20 min  Stress: No Stress Concern Present (10/16/2021)   [place]    Feeling of Stress : Not at all  Social Connections: Moderately Isolated (10/16/2021)   Social Connection and Isolation Panel [NHANES]    Frequency of Communication with Friends and Family: More than three times a week    Frequency of Social Gatherings with Friends and Family: More than three times a week    Attends Religious Services: More than 4 times per year    Active Member of [employer] or Organizations: No    Attends Archivist Meetings: Never    Marital Status: Widowed      Family History:  The patient's family history includes Heart disease in his brother; Lung cancer in his sister; Prostate cancer in his father; Stroke in an other family member.   ROS:  Please see the history of present illness.    ROS All other systems reviewed and are negative.      No data to display             PHYSICAL EXAM:   VS:  BP (!) 130/98   Pulse 76   Ht '5\' 10"'$  (1.778 m)   Wt 222 lb (100.7 kg)   BMI 31.85 kg/m     Today's Vitals   12/30/21 1431 12/30/21 1439  BP: (!) 150/100 (!) 130/98  Pulse: 76   Weight: 222 lb (100.7 kg)   Height: '5\' 10"'$  (1.778 m)    Body mass index is 31.85 kg/m.   GEN: Well nourished, well developed in no acute distress HEENT: Normal NECK: No JVD; No carotid bruits LYMPHATICS: No lymphadenopathy CARDIAC: Irregularly irregular, no murmurs, rubs, gallops RESPIRATORY:  Clear to auscultation without rales, wheezing or rhonchi  ABDOMEN: Soft, non-tender, non-distended MUSCULOSKELETAL:  No edema; No deformity  SKIN: Warm and dry NEUROLOGIC:  Alert and oriented x 3 PSYCHIATRIC:  Normal affect   Wt Readings from Last 3 Encounters:  12/30/21 222 lb (100.7 kg)  12/04/21 221 lb 9.6 oz (100.5 kg)  11/25/21 214 lb 9.6 oz (97.3 kg)      Studies/Labs Reviewed:   EKG:  EKG is not ordered today.    Recent Labs: 11/20/2021: ALT 10; BUN 16; Creatinine, Ser 1.28; Hemoglobin 16.1; Platelets 169.0; Potassium 4.0; Sodium 140; TSH 1.69   Lipid Panel    Component Value Date/Time   CHOL 134 11/20/2021 0904   TRIG 119.0 11/20/2021 0904   HDL 32.60 (L) 11/20/2021 0904   CHOLHDL 4 11/20/2021 0904   VLDL 23.8 11/20/2021 0904   LDLCALC 78 11/20/2021 0904     CHA2DS2-VASc Score = 2  This indicates a 2.2% annual risk of stroke. The patient's score is based upon: CHF History: 0 HTN History: 0 Diabetes History: 0 Stroke History: 0 Vascular Disease History: 0 Age Score: 2 Gender Score: 0    Additional studies/ records that were  reviewed today include:  OV notes from PCP    ASSESSMENT:    1. New onset atrial fibrillation ([place])   2. Essential hypertension   3. Pure hypercholesterolemia   4. Multiple risk factors for coronary artery disease      PLAN:  In order of problems listed above:  New onset atrial fibrillation -he is unaware of his arrhythmia so onset is unknown -he denies any palpitations and his HR is controlled -He did not tolerate Lopressor due to dizziness so at last office visit his Lopressor was stopped as well as amlodipine and he was changed to Cardizem CD 180 mg daily which he is tolerating -2D echo showed normal LV function -Continue prescription drug management with Cardizem CD 180 mg daily and Eliquis 5 mg twice daily with as needed refills -I have personally reviewed and interpreted outside labs performed by patient's PCP which showed hemoglobin 16.1 and creatinine 1.28 on 11/20/2021 -He remains in atrial fibrillation on exam today and has not missed any doses of Eliquis so we will set Huang up for outpatient cardioversion -Shared Decision Making/Informed Consent The risks (stroke, cardiac arrhythmias rarely resulting in the need for a temporary or permanent pacemaker, skin irritation or burns and complications associated with conscious sedation including aspiration, arrhythmia, respiratory failure and death), benefits (restoration of normal sinus rhythm) and alternatives of a direct current cardioversion were explained in detail to Chad Huang and he agrees to proceed.  2.  HTN -BP is poorly controlled on exam today -he is using a lot of table salt and salty snacks -I had a long discussion today with Huang and his daughter about following a strict <2gm Na diet and how to read labels for Na.  I also instructed Huang to cut out all added salt in his diet and use Mrs. Dash salt substitute -continue prescription drug therapy with Cardizem CD '180mg'$  daily -will get a 48 hour BP monitor to assess BP  control with dietary changes with Na prior to increasing Cardizem further  3.  HLD -LDL goal < 100 >> coronary calcium score was 0 -I have personally reviewed and interpreted outside labs performed by patient's PCP which showed LDL 78, HDL 32, TAG 119, ALT 10 on 11/20/21 -At last office visit his simvastatin was changed to atorvastatin 20 mg daily because of the interaction between simvastatin and Cardizem -He has a repeat FLP and ALT coming up soon  4.  Cardiac risk factors for CAD -he has a hx of HTN, HLD, remote tobacco use and fm hx of CAD -Coronary Score was 0   Time Spent: 20 minutes total time of encounter, including 15 minutes spent in face-to-face patient care on the date of this encounter. This time includes coordination of care and counseling regarding above mentioned problem list. Remainder of non-face-to-face time involved reviewing chart documents/testing relevant to the patient encounter and documentation in the medical record. I have independently reviewed documentation from referring provider  Medication Adjustments/Labs and Tests Ordered: Current medicines are reviewed at length with the patient today.  Concerns regarding medicines are outlined above.  Medication changes, Labs and Tests ordered today are listed in the Patient Instructions below.  Patient Instructions  Medication Instructions:  Your physician recommends that you continue on your current medications as directed. Please refer to the Current Medication list given to you today.  *If you need a refill on your cardiac medications before your next appointment, please call your pharmacy*   Lab Work: BMET and CBC If you have labs (blood work) drawn today and your tests are completely normal, you will receive your results only by: [place] (if you have MyChart) OR A paper copy in the mail If you have any lab test that is abnormal or we need to change your treatment, we will call you to review the  results.   Testing/Procedures: Your physician has recommended that you have a Cardioversion (DCCV). Electrical Cardioversion uses a jolt of electricity to your heart either through paddles or wired patches attached to your chest. This is a controlled, usually prescheduled, procedure. Defibrillation is done under light anesthesia in the hospital, and you usually go home the day of the procedure. This is done to get your heart back into a normal rhythm. You are not awake for the procedure. Please see the instruction sheet given to you today.   Follow-Up: At [facility], you and your health needs are our priority.  As part of our continuing mission to provide you with exceptional heart care, we have created designated Provider Care Teams.  These Care Teams include your primary Cardiologist (physician) and Advanced Practice Providers (APPs -  Physician Assistants and Nurse Practitioners) who all work together to provide you with the care you need, when you need it.  Your next appointment:     The format for your next appointment:   In Person  Provider:   Fransico Him, MD {   Cardioversion Instructions: You are  scheduled for a Cardioversion on 7/28 with Dr. Radford Pax. Please arrive at the [facility] (Main Entrance A) at [facility]: [address] [place], [place] [address] at 6:30am. (1 hour prior to procedure unless lab work is needed; if lab work is needed arrive 1.5 hours ahead)  DIET: Nothing to eat or drink after midnight except a sip of water with medications (see medication instructions below)  FYI: For your safety, and to allow [place] to monitor your vital signs accurately during the surgery/procedure we request that   if you have artificial nails, gel coating, SNS etc. Please have those removed prior to your surgery/procedure. Not having the nail coverings /polish removed may result in cancellation or delay of your surgery/procedure.  Medication Instructions:  Continue your  anticoagulant: Eliquis You will need to continue your anticoagulant after your procedure until you are told by your Provider that it is safe to stop  Labs: Come to: Come to the lab at [facility] between the hours of 7:15 am and 5:00 pm.   You must have a responsible person to drive you home and stay in the waiting area during your procedure. Failure to do so could result in cancellation.  Bring your insurance cards.  *Special Note: Every effort is made to have your procedure done on time. Occasionally there are emergencies that occur at the hospital that may cause delays. Please be patient if a delay does occur.      Low-Sodium Eating Plan Sodium, which is an element that makes up salt, helps you maintain a healthy balance of fluids in your body. Too much sodium can increase your blood pressure and cause fluid and waste to be held in your body. Your health care provider or dietitian may recommend following this plan if you have high blood pressure (hypertension), kidney disease, liver disease, or heart failure. Eating less sodium can help lower your blood pressure, reduce swelling, and protect your heart, liver, and kidneys. What are tips for following this plan? Reading food labels The Nutrition Facts label lists the amount of sodium in one serving of the food. If you eat more than one serving, you must multiply the listed amount of sodium by the number of servings. Choose foods with less than 140 mg of sodium per serving. Avoid foods with 300 mg of sodium or more per serving. Shopping  Look for lower-sodium products, often labeled as "low-sodium" or "no salt added." Always check the sodium content, even if foods are labeled as "unsalted" or "no salt added." Buy fresh foods. Avoid canned foods and pre-made or frozen meals. Avoid canned, cured, or processed meats. Buy breads that have less than 80 mg of sodium per slice. Cooking  Eat more home-cooked food and less restaurant,  buffet, and fast food. Avoid adding salt when cooking. Use salt-free seasonings or herbs instead of table salt or sea salt. Check with your health care provider or pharmacist before using salt substitutes. Cook with plant-based oils, such as canola, sunflower, or olive oil. Meal planning When eating at a restaurant, ask that your food be prepared with less salt or no salt, if possible. Avoid dishes labeled as brined, pickled, cured, smoked, or made with soy sauce, miso, or teriyaki sauce. Avoid foods that contain MSG (monosodium glutamate). MSG is sometimes added to [place] food, bouillon, and some canned foods. Make meals that can be grilled, baked, poached, roasted, or steamed. These are generally made with less sodium. General information Most people on this  plan should limit their sodium intake to 1,500-2,000 mg (milligrams) of sodium each day. What foods should I eat? Fruits Fresh, frozen, or canned fruit. Fruit juice. Vegetables Fresh or frozen vegetables. "No salt added" canned vegetables. "No salt added" tomato sauce and paste. Low-sodium or reduced-sodium tomato and vegetable juice. Grains Low-sodium cereals, including oats, puffed wheat and rice, and shredded wheat. Low-sodium crackers. Unsalted rice. Unsalted pasta. Low-sodium bread. Whole-grain breads and whole-grain pasta. Meats and other proteins Fresh or frozen (no salt added) meat, poultry, seafood, and fish. Low-sodium canned tuna and salmon. Unsalted nuts. Dried peas, beans, and lentils without added salt. Unsalted canned beans. Eggs. Unsalted nut butters. Dairy Milk. Soy milk. Cheese that is naturally low in sodium, such as ricotta cheese, fresh mozzarella, or Swiss cheese. Low-sodium or reduced-sodium cheese. Cream cheese. Yogurt. Seasonings and condiments Fresh and dried herbs and spices. Salt-free seasonings. Low-sodium mustard and ketchup. Sodium-free salad dressing. Sodium-free light mayonnaise. Fresh or refrigerated  horseradish. Lemon juice. Vinegar. Other foods Homemade, reduced-sodium, or low-sodium soups. Unsalted popcorn and pretzels. Low-salt or salt-free chips. The items listed above may not be a complete list of foods and beverages you can eat. Contact a dietitian for more information. What foods should I avoid? Vegetables Sauerkraut, pickled vegetables, and relishes. Olives. [place] fries. Onion rings. Regular canned vegetables (not low-sodium or reduced-sodium). Regular canned tomato sauce and paste (not low-sodium or reduced-sodium). Regular tomato and vegetable juice (not low-sodium or reduced-sodium). Frozen vegetables in sauces. Grains Instant hot cereals. Bread stuffing, pancake, and biscuit mixes. Croutons. Seasoned rice or pasta mixes. Noodle soup cups. Boxed or frozen macaroni and cheese. Regular salted crackers. Self-rising flour. Meats and other proteins Meat or fish that is salted, canned, smoked, spiced, or pickled. Precooked or cured meat, such as sausages or meat loaves. Berniece Salines. Ham. Pepperoni. Hot dogs. Corned beef. Chipped beef. Salt pork. Jerky. Pickled herring. Anchovies and sardines. Regular canned tuna. Salted nuts. Dairy Processed cheese and cheese spreads. Hard cheeses. Cheese curds. Blue cheese. Feta cheese. String cheese. Regular cottage cheese. Buttermilk. Canned milk. Fats and oils Salted butter. Regular margarine. Ghee. Bacon fat. Seasonings and condiments Onion salt, garlic salt, seasoned salt, table salt, and sea salt. Canned and packaged gravies. Worcestershire sauce. Tartar sauce. Barbecue sauce. Teriyaki sauce. Soy sauce, including reduced-sodium. Steak sauce. Fish sauce. Oyster sauce. Cocktail sauce. Horseradish that you find on the shelf. Regular ketchup and mustard. Meat flavorings and tenderizers. Bouillon cubes. Hot sauce. Pre-made or packaged marinades. Pre-made or packaged taco seasonings. Relishes. Regular salad dressings. Salsa. Other foods Salted popcorn and  pretzels. Corn chips and puffs. Potato and tortilla chips. Canned or dried soups. Pizza. Frozen entrees and pot pies. The items listed above may not be a complete list of foods and beverages you should avoid. Contact a dietitian for more information. Summary Eating less sodium can help lower your blood pressure, reduce swelling, and protect your heart, liver, and kidneys. Most people on this plan should limit their sodium intake to 1,500-2,000 mg (milligrams) of sodium each day. Canned, boxed, and frozen foods are high in sodium. Restaurant foods, fast foods, and pizza are also very high in sodium. You also get sodium by adding salt to food. Try to cook at home, eat more fresh fruits and vegetables, and eat less fast food and canned, processed, or prepared foods. This information is not intended to replace advice given to you by your health care provider. Make sure you discuss any questions you have with your health care provider. Document  Revised: 06/08/2019 Document Reviewed: 04/04/2019 Elsevier Patient Education  [place], Chad Him, MD  12/30/2021 4:25 PM    [facility] [place], [place], [place]  [address] Phone: [phone]; Fax: [phone]

## 2022-01-01 ENCOUNTER — Encounter (HOSPITAL_COMMUNITY): Payer: Self-pay | Admitting: Internal Medicine

## 2022-01-07 ENCOUNTER — Other Ambulatory Visit: Payer: Self-pay | Admitting: Cardiology

## 2022-01-07 ENCOUNTER — Other Ambulatory Visit: Payer: Medicare Other

## 2022-01-07 DIAGNOSIS — I4891 Unspecified atrial fibrillation: Secondary | ICD-10-CM

## 2022-01-07 DIAGNOSIS — I10 Essential (primary) hypertension: Secondary | ICD-10-CM

## 2022-01-07 DIAGNOSIS — I4819 Other persistent atrial fibrillation: Secondary | ICD-10-CM

## 2022-01-07 DIAGNOSIS — Z9189 Other specified personal risk factors, not elsewhere classified: Secondary | ICD-10-CM | POA: Diagnosis not present

## 2022-01-07 DIAGNOSIS — E7800 Pure hypercholesterolemia, unspecified: Secondary | ICD-10-CM

## 2022-01-08 ENCOUNTER — Ambulatory Visit (HOSPITAL_COMMUNITY)
Admission: RE | Admit: 2022-01-08 | Discharge: 2022-01-08 | Disposition: A | Discharge status: Home / Self Care | Payer: Medicare Other | Attending: Internal Medicine | Admitting: Internal Medicine

## 2022-01-08 ENCOUNTER — Other Ambulatory Visit: Payer: Self-pay

## 2022-01-08 ENCOUNTER — Encounter (HOSPITAL_COMMUNITY)
Admission: RE | Disposition: A | Discharge status: Home / Self Care | Payer: Self-pay | Source: Home / Self Care | Attending: Internal Medicine

## 2022-01-08 ENCOUNTER — Ambulatory Visit (HOSPITAL_COMMUNITY): Payer: Medicare Other | Admitting: Certified Registered"

## 2022-01-08 ENCOUNTER — Ambulatory Visit (HOSPITAL_BASED_OUTPATIENT_CLINIC_OR_DEPARTMENT_OTHER): Payer: Medicare Other | Admitting: Certified Registered"

## 2022-01-08 DIAGNOSIS — Z87891 Personal history of nicotine dependence: Secondary | ICD-10-CM | POA: Insufficient documentation

## 2022-01-08 DIAGNOSIS — K219 Gastro-esophageal reflux disease without esophagitis: Secondary | ICD-10-CM | POA: Insufficient documentation

## 2022-01-08 DIAGNOSIS — I10 Essential (primary) hypertension: Secondary | ICD-10-CM | POA: Diagnosis not present

## 2022-01-08 DIAGNOSIS — I4819 Other persistent atrial fibrillation: Secondary | ICD-10-CM

## 2022-01-08 DIAGNOSIS — Z7901 Long term (current) use of anticoagulants: Secondary | ICD-10-CM | POA: Insufficient documentation

## 2022-01-08 DIAGNOSIS — Z79899 Other long term (current) drug therapy: Secondary | ICD-10-CM | POA: Diagnosis not present

## 2022-01-08 DIAGNOSIS — I4891 Unspecified atrial fibrillation: Secondary | ICD-10-CM | POA: Insufficient documentation

## 2022-01-08 DIAGNOSIS — E7800 Pure hypercholesterolemia, unspecified: Secondary | ICD-10-CM | POA: Insufficient documentation

## 2022-01-08 DIAGNOSIS — E782 Mixed hyperlipidemia: Secondary | ICD-10-CM | POA: Diagnosis not present

## 2022-01-08 DIAGNOSIS — G709 Myoneural disorder, unspecified: Secondary | ICD-10-CM

## 2022-01-08 DIAGNOSIS — I2510 Atherosclerotic heart disease of native coronary artery without angina pectoris: Secondary | ICD-10-CM | POA: Diagnosis not present

## 2022-01-08 DIAGNOSIS — Z8546 Personal history of malignant neoplasm of prostate: Secondary | ICD-10-CM | POA: Insufficient documentation

## 2022-01-08 DIAGNOSIS — K449 Diaphragmatic hernia without obstruction or gangrene: Secondary | ICD-10-CM | POA: Diagnosis not present

## 2022-01-08 DIAGNOSIS — Z8249 Family history of ischemic heart disease and other diseases of the circulatory system: Secondary | ICD-10-CM | POA: Insufficient documentation

## 2022-01-08 HISTORY — PX: CARDIOVERSION: SHX1299

## 2022-01-08 LAB — BASIC METABOLIC PANEL
BUN/Creatinine Ratio: 10 (ref 10–24)
BUN: 12 mg/dL (ref 8–27)
CO2: 21 mmol/L (ref 20–29)
Calcium: 8.9 mg/dL (ref 8.6–10.2)
Chloride: 104 mmol/L (ref 96–106)
Creatinine, Ser: 1.2 mg/dL (ref 0.76–1.27)
Glucose: 103 mg/dL — ABNORMAL HIGH (ref 70–99)
Potassium: 3.9 mmol/L (ref 3.5–5.2)
Sodium: 143 mmol/L (ref 134–144)
eGFR: 61 mL/min/{1.73_m2} (ref >59)

## 2022-01-08 LAB — ALT: ALT: 13 IU/L (ref 0–44)

## 2022-01-08 LAB — CBC
Hematocrit: 49.3 % (ref 37.5–51.0)
Hemoglobin: 16.3 g/dL (ref 13.0–17.7)
MCH: 28.7 pg (ref 26.6–33.0)
MCHC: 33.1 g/dL (ref 31.5–35.7)
MCV: 87 fL (ref 79–97)
Platelets: 197 10*3/uL (ref 150–450)
RBC: 5.67 x10E6/uL (ref 4.14–5.80)
RDW: 13.1 % (ref 11.6–15.4)
WBC: 8 10*3/uL (ref 3.4–10.8)

## 2022-01-08 LAB — LIPID PANEL
Chol/HDL Ratio: 3.5 ratio (ref 0.0–5.0)
Cholesterol, Total: 118 mg/dL (ref 100–199)
HDL: 34 mg/dL — ABNORMAL LOW (ref >39)
LDL Chol Calc (NIH): 61 mg/dL (ref 0–99)
Triglycerides: 125 mg/dL (ref 0–149)
VLDL Cholesterol Cal: 23 mg/dL (ref 5–40)

## 2022-01-08 SURGERY — CARDIOVERSION
Anesthesia: Monitor Anesthesia Care

## 2022-01-08 MED ORDER — PROPOFOL 10 MG/ML IV BOLUS
INTRAVENOUS | Status: DC | PRN
  Administered 2022-01-08: 80 mg via INTRAVENOUS

## 2022-01-08 MED ORDER — SODIUM CHLORIDE 0.9 % IV SOLN: INTRAVENOUS | Status: DC

## 2022-01-08 MED ORDER — LIDOCAINE HCL (CARDIAC) PF 100 MG/5ML IV SOSY
PREFILLED_SYRINGE | INTRAVENOUS | Status: DC | PRN
  Administered 2022-01-08: 40 mg via INTRATRACHEAL

## 2022-01-08 MED ORDER — SODIUM CHLORIDE 0.9 % IV SOLN: INTRAVENOUS | Status: DC | PRN

## 2022-01-08 MED ORDER — HYDROCORTISONE 1 % EX CREA: 1.0000 | TOPICAL_CREAM | Freq: Three times a day (TID) | CUTANEOUS | Status: DC | PRN

## 2022-01-08 NOTE — Anesthesia Preprocedure Evaluation (Signed)
Anesthesia Evaluation  Patient identified by MRN, date of birth, ID band Patient awake    Reviewed: Allergy & Precautions, NPO status   Airway Mallampati: II  TM Distance: >3 FB     Dental   Pulmonary former smoker,    breath sounds clear to auscultation       Cardiovascular hypertension,  Rhythm:Regular Rate:Normal     Neuro/Psych  Neuromuscular disease    GI/Hepatic Neg liver ROS, hiatal hernia, GERD  ,  Endo/Other    Renal/GU negative Renal ROS     Musculoskeletal   Abdominal   Peds  Hematology   Anesthesia Other Findings   Reproductive/Obstetrics                             Anesthesia Physical Anesthesia Plan  ASA: 3  Anesthesia Plan: MAC   Post-op Pain Management:    Induction: Intravenous  PONV Risk Score and Plan: Treatment may vary due to age or medical condition and Propofol infusion  Airway Management Planned: Nasal Cannula and Simple Face Mask  Additional Equipment:   Intra-op Plan:   Post-operative Plan:   Informed Consent: I have reviewed the patients History and Physical, chart, labs and discussed the procedure including the risks, benefits and alternatives for the proposed anesthesia with the patient or authorized representative who has indicated his/her understanding and acceptance.     Dental advisory given  Plan Discussed with: CRNA and Anesthesiologist  Anesthesia Plan Comments:         Anesthesia Quick Evaluation

## 2022-01-08 NOTE — Anesthesia Procedure Notes (Signed)
Procedure Name: General with mask airway Date/Time: 01/08/2022 8:31 AM  Performed by: Gaylene Brooks, CRNAPre-anesthesia Checklist: Patient identified, Emergency Drugs available, Suction available, Patient being monitored and Timeout performed Patient Re-evaluated:Patient Re-evaluated prior to induction Oxygen Delivery Method: Ambu bag Preoxygenation: Pre-oxygenation with 100% oxygen Induction Type: IV induction Dental Injury: Teeth and Oropharynx as per pre-operative assessment

## 2022-01-08 NOTE — Interval H&P Note (Signed)
History and Physical Interval Note:  01/08/2022 8:29 AM  Chad Huang  has presented today for surgery, with the diagnosis of AFIB.  The various methods of treatment have been discussed with the patient and family. After consideration of risks, benefits and other options for treatment, the patient has consented to  Procedure(s): CARDIOVERSION (N/A) as a surgical intervention.  The patient's history has been reviewed, patient examined, no change in status, stable for surgery.  I have reviewed the patient's chart and labs.  Questions were answered to the patient's satisfaction.     Dorris Carnes

## 2022-01-08 NOTE — Discharge Instructions (Signed)

## 2022-01-08 NOTE — CV Procedure (Signed)
CARDIOVERSION  Indication:  Atrial fibrillation  Patient anesthestized with 80 mg propofol and 40 mg  lidocaine by anesthesia  With pads in AP position, patient cardioverted to SR with 200 J synchronized biphasic energy     12 lead EKG pending     Procedure was without complication  Dorris Carnes MD

## 2022-01-08 NOTE — Transfer of Care (Signed)
Immediate Anesthesia Transfer of Care Note  Patient: Chad Huang  Procedure(s) Performed: CARDIOVERSION  Patient Location: Endoscopy Unit  Anesthesia Type:General  Level of Consciousness: awake, drowsy and patient cooperative  Airway & Oxygen Therapy: Patient Spontanous Breathing  Post-op Assessment: Report given to RN, Post -op Vital signs reviewed and stable and Patient moving all extremities X 4  Post vital signs: Reviewed and stable  Last Vitals:  Vitals Value Taken Time  BP    Temp    Pulse    Resp    SpO2      Last Pain:  Vitals:   01/08/22 0728  TempSrc: Temporal  PainSc: 0-No pain         Complications: No notable events documented.

## 2022-01-08 NOTE — Anesthesia Postprocedure Evaluation (Signed)
Anesthesia Post Note  Patient: Chad Huang  Procedure(s) Performed: CARDIOVERSION     Patient location during evaluation: PACU Anesthesia Type: MAC Level of consciousness: awake Pain management: pain level controlled Vital Signs Assessment: post-procedure vital signs reviewed and stable Respiratory status: spontaneous breathing Cardiovascular status: stable Postop Assessment: no apparent nausea or vomiting Anesthetic complications: no   No notable events documented.  Last Vitals:  Vitals:   01/08/22 0853 01/08/22 0908  BP: 114/86 120/80  Pulse: 72 70  Resp:  15  Temp:    SpO2: 97% 96%    Last Pain:  Vitals:   01/08/22 0843  TempSrc:   PainSc: 0-No pain                 Jeily Guthridge

## 2022-01-11 ENCOUNTER — Encounter (HOSPITAL_COMMUNITY): Payer: Self-pay | Admitting: Internal Medicine

## 2022-01-21 ENCOUNTER — Ambulatory Visit: Payer: Medicare Other | Admitting: Cardiology

## 2022-01-22 ENCOUNTER — Ambulatory Visit: Payer: Medicare Other | Admitting: Cardiology

## 2022-01-29 ENCOUNTER — Other Ambulatory Visit: Payer: Self-pay | Admitting: Primary Care

## 2022-01-29 DIAGNOSIS — R0981 Nasal congestion: Secondary | ICD-10-CM

## 2022-02-03 ENCOUNTER — Ambulatory Visit: Payer: Medicare Other | Admitting: Cardiology

## 2022-02-04 ENCOUNTER — Other Ambulatory Visit: Payer: Self-pay | Admitting: Cardiology

## 2022-02-04 DIAGNOSIS — R030 Elevated blood-pressure reading, without diagnosis of hypertension: Secondary | ICD-10-CM

## 2022-02-04 DIAGNOSIS — I10 Essential (primary) hypertension: Secondary | ICD-10-CM

## 2022-02-04 DIAGNOSIS — I4891 Unspecified atrial fibrillation: Secondary | ICD-10-CM

## 2022-02-04 DIAGNOSIS — Z9189 Other specified personal risk factors, not elsewhere classified: Secondary | ICD-10-CM

## 2022-02-04 DIAGNOSIS — E7800 Pure hypercholesterolemia, unspecified: Secondary | ICD-10-CM

## 2022-02-15 ENCOUNTER — Other Ambulatory Visit: Payer: Self-pay | Admitting: Primary Care

## 2022-02-15 DIAGNOSIS — I4891 Unspecified atrial fibrillation: Secondary | ICD-10-CM

## 2022-02-16 ENCOUNTER — Other Ambulatory Visit: Payer: Self-pay | Admitting: *Deleted

## 2022-02-16 DIAGNOSIS — I4891 Unspecified atrial fibrillation: Secondary | ICD-10-CM

## 2022-02-16 MED ORDER — APIXABAN 5 MG PO TABS: 5.0000 mg | ORAL_TABLET | Freq: Two times a day (BID) | ORAL | 1 refills | Status: DC

## 2022-02-16 NOTE — Telephone Encounter (Signed)
Prescription refill request for Eliquis received. Indication:Afib  Last office visit:12/30/21 ([facility])  Scr: 1.20 (01/07/22)  Age: 81 Weight: 98.4kg  Appropriate dose and refill sent to requested pharmacy.

## 2022-02-16 NOTE — Telephone Encounter (Signed)
Received call from pt's daughter on the refill line.  Requesting refill for Eliquis to be sent to [facility].  Will send over to the [facility] pool.

## 2022-02-25 ENCOUNTER — Ambulatory Visit: Payer: Medicare Other | Attending: Cardiology

## 2022-02-25 DIAGNOSIS — I10 Essential (primary) hypertension: Secondary | ICD-10-CM

## 2022-02-25 DIAGNOSIS — E7800 Pure hypercholesterolemia, unspecified: Secondary | ICD-10-CM | POA: Diagnosis not present

## 2022-02-25 DIAGNOSIS — I4891 Unspecified atrial fibrillation: Secondary | ICD-10-CM | POA: Diagnosis not present

## 2022-02-25 DIAGNOSIS — Z9189 Other specified personal risk factors, not elsewhere classified: Secondary | ICD-10-CM | POA: Diagnosis not present

## 2022-02-25 DIAGNOSIS — R030 Elevated blood-pressure reading, without diagnosis of hypertension: Secondary | ICD-10-CM | POA: Diagnosis not present

## 2022-02-25 NOTE — Progress Notes (Signed)
Cardiology Office Note:    Date:  02/26/2022   ID:  Chad Huang, DOB 05/26/1940, MRN [id number]  PCP:  [name], NP  [place] Prov[facility] Cardiologist:  Fransico Him, MD    Referring MD: [name], NP   Chief Complaint:  F/u for Afib    Patient Profile: Persistent atrial fibrillation  Intol of metoprolol >> ? to Diltiazem  CAC Score 11/2021: 0 Echocardiogram 11/2021: EF 55-60 Dilated aortic root Echo 7/23: 40 mm Hypertension  Hyperlipidemia  Lung nodule CT 7/23: 30m RUL nodule >> managed by PCP GERD ETOH abuse Depression/anxiety Prostate CA BPH  Prior CV Studies: ECHO COMPLETE WO IMAGING ENHANCING AGENT 12/11/2021 EF 55-60, no RWMA, mild LVH, normal RVSF, trivial MR, trivial AI, borderline dilation of aortic root (40 mm), RAP 3   CT CARDIAC SCORING (SELF PAY ONLY) 12/11/2021 IMPRESSION: Coronary calcium score of 0. This was 0 percentile for age-, race-, and sex-matched controls.  IMPRESSION: No acute findings in the imaged extracardiac chest.  2 mm right upper lobe pulmonary nodule. No follow-up needed if patient is low-risk. Non-contrast chest CT can be considered in 12 months if patient is high-risk. This recommendation follows the consensus statement: Guidelines for Management of Incidental Pulmonary Nodules Detected on CT Images: From the Fleischner Society 2017; Radiology 2017; 284:228-243.  History of Present Illness:   Chad Huang a 81y.o. male with the above problem list.  He was last seen by Dr. TRadford Paxin Aug 2023. He was set up for DCCV which was done on 01/08/22. He converted to NSR with 1 shock. Of note, 48 Hr BP monitor is pending for assessment of BP control. He returns for f/u.  He is here with his daughter.  He has been doing well since cardioversion.  He had no symptoms prior to cardioversion.  He has felt well since.  He has not had chest pain, significant shortness of breath, orthopnea, leg edema or syncope.     Past Medical History:  Diagnosis Date   Alcohol abuse    Anxiety and depression    charter, admission   Back pain    ([place]rit[place]), microdiskectomy-great result   Benign prostatic hypertrophy    per Dr WJeffie Pollockwith uro   Cataracts, bilateral    Cerumen impaction    left   Chest pain    [place]    40 mm by echo 11/2021   GERD (gastroesophageal reflux disease)    Hearing loss    Hiatal hernia    nodule   HTN (hypertension)    Hyperlipidemia, mixed    Inguinal hernia    left, hx of, repair   Internal hemorrhoids    Left ear pain    Muscle disorder    disorder, muscle/ligaminet nis, rt lower extremity   [place] cancer [place] 05[place]   Dr WJeffie Pollock  Skin cancer    hx, left ear   Current Medications: Current Meds  Medication Sig   apixaban (ELIQUIS) 5 MG TABS tablet Take 1 tablet (5 mg total) by mouth 2 (two) times daily.   atorvastatin (LIPITOR) 20 MG tablet Take 1 tablet (20 mg total) by mouth daily.   diltiazem (CARDIZEM CD) 180 MG 24 hr capsule Take 180 mg by mouth daily.   fluticasone (FLONASE) 50 MCG/ACT nasal spray PLACE 1 SPRAY INTO BOTH NOSTRILS 2 TIMES DAILY AS NEEDED FOR ALLERGIES OR RHINITIS.   pantoprazole (PROTONIX) 40 MG tablet TAKE 1 TABLET BY MOUTH  DAILY FOR HEARTBURN   tamsulosin (FLOMAX) 0.4 MG CAPS capsule Take 0.4 mg by mouth daily as needed (Bladder problems).    Allergies:   Augmentin [amoxicillin-pot clavulanate], Clarithromycin, Codeine phosphate, and Nabumetone   Social History   Tobacco Use   Smoking status: Former    Packs/day: 1.00    Years: 20.00    Total pack years: 20.00    Types: Cigarettes   Smokeless tobacco: Current    Types: Chew   Tobacco comments:    quit in 1973  Vaping Use   Vaping Use: Never used  Substance Use Topics   Alcohol use: No    Comment: none since 1996   Drug use: No    Family Hx: The patient's family history includes Heart disease in his brother; Lung cancer in his sister; Prostate cancer in  his father; Stroke in an other family member.  Review of Systems  Gastrointestinal:  Negative for hematochezia and melena.  Genitourinary:  Negative for hematuria.     EKGs/Labs/Other Test Reviewed:    EKG:  EKG is  ordered today.  The ekg ordered today demonstrates NSR, HR 70, normal axis, no ST-T wave changes, QTc 429  Recent Labs: 11/20/2021: TSH 1.69 01/07/2022: ALT 13; BUN 12; Creatinine, Ser 1.20; Hemoglobin 16.3; Platelets 197; Potassium 3.9; Sodium 143   Recent Lipid Panel Recent Labs    11/20/21 0904 01/07/22 0735  CHOL 134 118  TRIG 119.0 125  HDL 32.60* 34*  VLDL 23.8  --   LDLCALC 78 61     Risk Assessment/Calculations/Metrics:    CHA2DS2-VASc Score = 3   This indicates a 3.2% annual risk of stroke. The patient's score is based upon: CHF History: 0 HTN History: 1 Diabetes History: 0 Stroke History: 0 Vascular Disease History: 0 Age Score: 2 Gender Score: 0             Physical Exam:    VS:  BP 124/60   Pulse 70   Ht 5' 10.5" (1.791 m)   Wt 222 lb (100.7 kg)   SpO2 97%   BMI 31.40 kg/m     Wt Readings from Last 3 Encounters:  02/26/22 222 lb (100.7 kg)  01/08/22 217 lb (98.4 kg)  12/30/21 222 lb (100.7 kg)    Constitutional:      Appearance: Healthy appearance. Not in distress.  Neck:     Vascular: JVD normal.  Pulmonary:     Effort: Pulmonary effort is normal.     Breath sounds: No wheezing. No rales.  Cardiovascular:     Normal rate. Regular rhythm. Normal S1. Normal S2.      Murmurs: There is no murmur.  Edema:    Peripheral edema absent.  Abdominal:     Palpations: Abdomen is soft.  Skin:    General: Skin is warm and dry.  Neurological:     General: No focal deficit present.     Mental Status: Alert and oriented to person, place and time.         ASSESSMENT & PLAN:   Persistent atrial fibrillation (HCC) CHA2DS2-VASc Score = 3 [CHF History: 0, HTN History: 1, Diabetes History: 0, Stroke History: 0, Vascular Disease History:  0, Age Score: 2, Gender Score: 0].  Therefore, the patient's annual risk of stroke is 3.2 %.  Continue anticoagulation with Eliquis 5 mg twice daily.  Follow-up 6 months.  Essential hypertension Blood pressure is controlled.  He just finished his ambulatory blood pressure monitor yesterday.  Blood pressures  at home are typically optimal.  The highest he has seen recently was 138/82.  Continue diltiazem 180 mg daily.  Dilated aortic root (HCC) 40 mm on echocardiogram in July 2023.  Follow-up echo has been arranged for 1 year.            Dispo:  Return in about 6 months (around 08/28/2022) for Routine Follow Up with Dr. Radford Pax.   Medication Adjustments/Labs and Tests Ordered: Current medicines are reviewed at length with the patient today.  Concerns regarding medicines are outlined above.  Tests Ordered: Orders Placed This Encounter  Procedures   EKG 12-Lead   Medication Changes: No orders of the defined types were placed in this encounter.  Signed, Richardson Dopp, PA-C  02/26/2022 8:27 AM    [facility] [address], [place], [place]  [address] Phone: [phone]; Fax: [phone]

## 2022-02-25 NOTE — Progress Notes (Unsigned)
24 Hour ambulatory blood pressure monitor applied to patients right arm using standard adult cuff.

## 2022-02-26 ENCOUNTER — Ambulatory Visit: Payer: Medicare Other | Attending: Physician Assistant | Admitting: Physician Assistant

## 2022-02-26 ENCOUNTER — Encounter: Payer: Self-pay | Admitting: Physician Assistant

## 2022-02-26 VITALS — BP 124/60 | HR 70 | Ht 70.5 in | Wt 222.0 lb

## 2022-02-26 DIAGNOSIS — I77810 Thoracic aortic ectasia: Secondary | ICD-10-CM | POA: Diagnosis not present

## 2022-02-26 DIAGNOSIS — I10 Essential (primary) hypertension: Secondary | ICD-10-CM

## 2022-02-26 DIAGNOSIS — I4819 Other persistent atrial fibrillation: Secondary | ICD-10-CM

## 2022-02-26 NOTE — Assessment & Plan Note (Signed)
40 mm on echocardiogram in July 2023.  Follow-up echo has been arranged for 1 year.

## 2022-02-26 NOTE — Patient Instructions (Signed)
Medication Instructions:  NO CHANGES MADE TODAY  *If you need a refill on your cardiac medications before your next appointment, please call your pharmacy*   Lab Work: NONE ORDERED If you have labs (blood work) drawn today and your tests are completely normal, you will receive your results only by: [place] (if you have MyChart) OR A paper copy in the mail If you have any lab test that is abnormal or we need to change your treatment, we will call you to review the results.   Testing/Procedures: Echo to be done in 1 year   Follow-Up: At [facility], you and your health needs are our priority.  As part of our continuing mission to provide you with exceptional heart care, we have created designated Provider Care Teams.  These Care Teams include your primary Cardiologist (physician) and Advanced Practice Providers (APPs -  Physician Assistants and Nurse Practitioners) who all work together to provide you with the care you need, when you need it.  We recommend signing up for the patient portal called "MyChart".  Sign up information is provided on this After Visit Summary.  MyChart is used to connect with patients for Virtual Visits (Telemedicine).  Patients are able to view lab/test results, encounter notes, upcoming appointments, etc.  Non-urgent messages can be sent to your provider as well.   To learn more about what you can do with MyChart, go to [email or web address].    Your next appointment:   6 month(s)  The format for your next appointment:   In Person  Provider:   Fransico Him, MD     Other Instructions   Important Information About Sugar

## 2022-02-26 NOTE — Assessment & Plan Note (Signed)
CHA2DS2-VASc Score = 3 [CHF History: 0, HTN History: 1, Diabetes History: 0, Stroke History: 0, Vascular Disease History: 0, Age Score: 2, Gender Score: 0].  Therefore, the patient's annual risk of stroke is 3.2 %.  Continue anticoagulation with Eliquis 5 mg twice daily.  Follow-up 6 months.

## 2022-02-26 NOTE — Assessment & Plan Note (Signed)
Blood pressure is controlled.  He just finished his ambulatory blood pressure monitor yesterday.  Blood pressures at home are typically optimal.  The highest he has seen recently was 138/82.  Continue diltiazem 180 mg daily.

## 2022-06-22 DIAGNOSIS — H52223 Regular astigmatism, bilateral: Secondary | ICD-10-CM | POA: Diagnosis not present

## 2022-06-22 DIAGNOSIS — Z9842 Cataract extraction status, left eye: Secondary | ICD-10-CM | POA: Diagnosis not present

## 2022-06-22 DIAGNOSIS — Z9841 Cataract extraction status, right eye: Secondary | ICD-10-CM | POA: Diagnosis not present

## 2022-06-22 DIAGNOSIS — H11042 Peripheral pterygium, stationary, left eye: Secondary | ICD-10-CM | POA: Diagnosis not present

## 2022-07-27 ENCOUNTER — Encounter: Payer: Self-pay | Admitting: Internal Medicine

## 2022-07-27 ENCOUNTER — Ambulatory Visit (INDEPENDENT_AMBULATORY_CARE_PROVIDER_SITE_OTHER): Payer: Medicare Other | Admitting: Internal Medicine

## 2022-07-27 VITALS — BP 112/70 | HR 75 | Temp 97.7°F | Ht 70.5 in | Wt 226.0 lb

## 2022-07-27 DIAGNOSIS — J3489 Other specified disorders of nose and nasal sinuses: Secondary | ICD-10-CM | POA: Diagnosis not present

## 2022-07-27 MED ORDER — TRIAMCINOLONE ACETONIDE 0.5 % EX OINT: 1.0000 | TOPICAL_OINTMENT | Freq: Two times a day (BID) | CUTANEOUS | 2 refills | Status: DC

## 2022-07-27 NOTE — Progress Notes (Signed)
Subjective:    Patient ID: Chad Huang, male    DOB: Oct 10, 1940, 82 y.o.   MRN: [id number]  HPI Here due to nose pain  Feels like he has "knots" on both sides of his nose Happened a couple of years ago Went to Dr Chad Huang triamcinolone 0.5% cream that he would swab inside Ran out of it though and it has been bothering him again for 2-3 months Gets painful when he hasn't treated it Also uses vicks  Current Outpatient Medications on File Prior to Visit  Medication Sig Dispense Refill   apixaban (ELIQUIS) 5 MG TABS tablet Take 1 tablet (5 mg total) by mouth 2 (two) times daily. 180 tablet 1   atorvastatin (LIPITOR) 20 MG tablet Take 1 tablet (20 mg total) by mouth daily. 90 tablet 3   diltiazem (CARDIZEM CD) 180 MG 24 hr capsule Take 180 mg by mouth daily.     fluticasone (FLONASE) 50 MCG/ACT nasal spray PLACE 1 SPRAY INTO BOTH NOSTRILS 2 TIMES DAILY AS NEEDED FOR ALLERGIES OR RHINITIS. 48 mL 2   pantoprazole (PROTONIX) 40 MG tablet TAKE 1 TABLET BY MOUTH  DAILY FOR HEARTBURN 90 tablet 3   tamsulosin (FLOMAX) 0.4 MG CAPS capsule Take 0.4 mg by mouth daily as needed (Bladder problems).     No current facility-administered medications on file prior to visit.    Allergies  Allergen Reactions   Augmentin [Amoxicillin-Pot Clavulanate] Diarrhea   Clarithromycin     REACTION: unspecified   Codeine Phosphate     REACTION: nausea   Nabumetone     REACTION: Muscle cramps    Past Medical History:  Diagnosis Date   Alcohol abuse    Anxiety and depression    charter, admission   Back pain    (Kritzer), microdiskectomy-great result   Benign prostatic hypertrophy    per Dr Chad Huang with uro   Cataracts, bilateral    Cerumen impaction    left   Chest pain    Dilated aortic root (HCC)    40 mm by echo 11/2021   GERD (gastroesophageal reflux disease)    Hearing loss    Hiatal hernia    nodule   HTN (hypertension)    Hyperlipidemia, mixed    Inguinal hernia    left, hx of,  repair   Internal hemorrhoids    Left ear pain    Muscle disorder    disorder, muscle/ligaminet nis, rt lower extremity   Prostate cancer ([place]) 05/17/2008   Dr Chad Huang   Skin cancer    hx, left ear    Past Surgical History:  Procedure Laterality Date   back microdiskectomy  10/01   [place]   BACK SURGERY     CARDIOVASCULAR STRESS TEST  2009   [place]   CARDIOVERSION N/A 01/08/2022   Procedure: CARDIOVERSION;  Surgeon: Chad Records, MD;  Location: [facility] ENDOSCOPY;  Service: Cardiovascular;  Laterality: N/A;   COLONOSCOPY  5/05   polyps int hemorrhoids, repeat 10 yrs   ESOPHAGOGASTRODUODENOSCOPY  03/02/00   gastritis, chronic   ESOPHAGOGASTRODUODENOSCOPY  5/05   polyps in antrum, neg H Pylori   ETT  05/19/07   Adenosine Muoview   H Pylori positive  8/98   HERNIA REPAIR     LAPAROSCOPIC INGUINAL HERNIA REPAIR  10/96   levitin eval  02/05/99   hip pain   LUMBAR SPINE SURGERY  5/00   MRI  10/07/98   degen changes 9Dr. French Huang) steroid injections   MYELOGRAM  9/01   stenosis L4-5    Family History  Problem Relation Age of Onset   Prostate cancer Father    Stroke Other        grandmother   Lung cancer Sister    Heart disease Brother     Social History   Socioeconomic History   Marital status: Widowed    Spouse name: Not on file   Number of children: Not on file   Years of education: Not on file   Highest education level: Not on file  Occupational History   Not on file  Tobacco Use   Smoking status: Former    Packs/day: 1.00    Years: 20.00    Total pack years: 20.00    Types: Cigarettes   Smokeless tobacco: Current    Types: Chew   Tobacco comments:    quit in 1973  Vaping Use   Vaping Use: Never used  Substance and Sexual Activity   Alcohol use: No    Comment: none since 1996   Drug use: No   Sexual activity: Not Currently  Other Topics Concern   Not on file  Social History Narrative   Married,    One daughter.    1 granddaughter.   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   Active.      [phone] (c?)   Social Determinants of Health   Financial Resource Strain: Low Risk  (10/16/2021)   Overall Financial Resource Strain (CARDIA)    Difficulty of Paying Living Expenses: Not hard at all  Food Insecurity: No Food Insecurity (10/16/2021)   Hunger Vital Sign    Worried About Running Out of Food in the Last Year: Never true    Ran Out of Food in the Last Year: Never true  Transportation Needs: No Transportation Needs (10/16/2021)   PRAPARE - Hydrologist (Medical): No    Lack of Transportation (Non-Medical): No  Physical Activity: Insufficiently Active (10/16/2021)   Exercise Vital Sign    Days of Exercise per Week: 2 days    Minutes of Exercise per Session: 20 min  Stress: No Stress Concern Present (10/16/2021)   [place]    Feeling of Stress : Not at all  Social Connections: Moderately Isolated (10/16/2021)   Social Connection and Isolation Panel [NHANES]    Frequency of Communication with Friends and Family: More than three times a week    Frequency of Social Gatherings with Friends and Family: More than three times a week    Attends Religious Services: More than 4 times per year    Active Member of [employer] or Organizations: No    Attends Archivist Meetings: Never    Marital Status: Widowed  Intimate Partner Violence: Not At Risk (10/16/2021)   Humiliation, Afraid, Rape, and Kick questionnaire    Fear of Current or Ex-Partner: No    Emotionally Abused: No    Physically Abused: No    Sexually Abused: No   Review of Systems PSA has been rising---has seen Dr Chad Huang. Worried about prostatitis but culture negative No urinary symptoms Is on flomax now--voiding okay on that (but has sig urgency still)    Objective:   Physical Exam Constitutional:      Appearance: Normal appearance.  HENT:     Nose:     Comments: No external lesions  or tenderness Internal swelling and redness--no polyp or worrisome lesion Neurological:  Mental Status: He is alert.            Assessment & Plan:

## 2022-07-27 NOTE — Assessment & Plan Note (Signed)
Has had good exam a while back---and triamcinolone topical helps (and he uses flonase bid) Will refill the ointment If persists, will need to go back to ENT (he will let me know)

## 2022-08-24 ENCOUNTER — Other Ambulatory Visit: Payer: Self-pay | Admitting: Primary Care

## 2022-08-24 ENCOUNTER — Telehealth: Payer: Self-pay | Admitting: Primary Care

## 2022-08-24 DIAGNOSIS — K219 Gastro-esophageal reflux disease without esophagitis: Secondary | ICD-10-CM

## 2022-08-24 NOTE — Telephone Encounter (Signed)
Patient scheduled.

## 2022-08-24 NOTE — Telephone Encounter (Signed)
Prescription Request  08/24/2022  LOV: 11/20/2021  What is the name of the medication or equipment? fluticasone (FLONASE) 50 MCG/ACT nasal spray pantoprazole (PROTONIX) 40 MG tablet   Have you contacted your pharmacy to request a refill? Yes   Which pharmacy would you like this sent to?   [facility] - [place], [place] - [address] [place] [address] Phone: [phone] Fax: [phone]   Patient notified that their request is being sent to the clinical staff for review and that they should receive a response within 2 business days.   Please advise at [phone] (daughter)

## 2022-08-24 NOTE — Telephone Encounter (Signed)
Called and advised patients daughter. She will check with pharmacies

## 2022-08-24 NOTE — Telephone Encounter (Signed)
He should have plenty of medication on hand.  Flonase was sent in September for 9 month supply, should have enough through June.  Pantoprazole was sent in July 2023 for 1 year supply, should have 3 months on hand.

## 2022-08-24 NOTE — Telephone Encounter (Signed)
Patient is due for CPE/follow up in mid July, this will be required prior to any further refills.  Please schedule, thank you!   

## 2022-08-25 NOTE — Telephone Encounter (Signed)
Called pharmacy they stated patient is on a auto refill and this medication will be sent on 08/28/22. Nothing further needed at this time.

## 2022-08-25 NOTE — Telephone Encounter (Signed)
Please call pharmacy to find out what's going on.

## 2022-08-25 NOTE — Telephone Encounter (Signed)
Patients daughter called back in and stated [facility] does not have the prescription on file for Pantoprazole 40 mg tab for him to get refills. She stated when she talked to the representative on the phone they were unsure what happened, but that a new Rx would need to be sent in.  Patient did get his CPE scheduled for July.

## 2022-08-25 NOTE — Telephone Encounter (Signed)
Patient daughter called to follow up on this refill request.

## 2022-08-25 NOTE — Telephone Encounter (Signed)
See phone encounter for further documentation

## 2022-09-16 NOTE — Progress Notes (Signed)
Office Visit    Patient Name: Chad Huang Date of Encounter: 09/16/2022  Primary Care Provider:  Doreene Nest, NP Primary Cardiologist:  Armanda Magic, MD Primary Electrophysiologist: None   Past Medical History    Past Medical History:  Diagnosis Date   Alcohol abuse    Anxiety and depression    charter, admission   Back pain    (Kritzer), microdiskectomy-great result   Benign prostatic hypertrophy    per Dr Annabell Howells with uro   Cataracts, bilateral    Cerumen impaction    left   Chest pain    Dilated aortic root (HCC)    40 mm by echo 11/2021   GERD (gastroesophageal reflux disease)    Hearing loss    Hiatal hernia    nodule   HTN (hypertension)    Hyperlipidemia, mixed    Inguinal hernia    left, hx of, repair   Internal hemorrhoids    Left ear pain    Muscle disorder    disorder, muscle/ligaminet nis, rt lower extremity   Prostate cancer (HCC) 05/17/2008   Dr Annabell Howells   Skin cancer    hx, left ear   Past Surgical History:  Procedure Laterality Date   back microdiskectomy  10/01   [place]   BACK SURGERY     CARDIOVASCULAR STRESS TEST  2009   [place]   CARDIOVERSION N/A 01/08/2022   Procedure: CARDIOVERSION;  Surgeon: Pricilla Riffle, MD;  Location: [facility] ENDOSCOPY;  Service: Cardiovascular;  Laterality: N/A;   COLONOSCOPY  5/05   polyps int hemorrhoids, repeat 10 yrs   ESOPHAGOGASTRODUODENOSCOPY  03/02/00   gastritis, chronic   ESOPHAGOGASTRODUODENOSCOPY  5/05   polyps in antrum, neg H Pylori   ETT  05/19/07   Adenosine Muoview   H Pylori positive  8/98   HERNIA REPAIR     LAPAROSCOPIC INGUINAL HERNIA REPAIR  10/96   levitin eval  02/05/99   hip pain   LUMBAR SPINE SURGERY  5/00   MRI  10/07/98   degen changes 9Dr. Madelon Lips) steroid injections   MYELOGRAM  9/01   stenosis L4-5    Allergies  Allergies  Allergen Reactions   Augmentin [Amoxicillin-Pot Clavulanate] Diarrhea   Clarithromycin     REACTION: unspecified   Codeine Phosphate      REACTION: nausea   Nabumetone     REACTION: Muscle cramps     History of Present Illness    Chad Huang  is a 82 year old with a PMH of persistent AF, HTN, HLD, lung nodule, GERD, EtOH abuse, prostate CA, BPH, dilated aortic root who presents today for 9-month follow-up of atrial fibrillation.  Chad Huang was seen initially by Dr. Mayford Knife for new onset atrial flutter after being discovered by PCP during exam.  He was asymptomatic and not aware of his flutter at that time.  He was started on Toprol-XL and Eliquis for anticoagulation.  He was having dizziness due to Toprol-XL and this was discontinued and patient was started on Cardizem 180 mg daily.  He underwent 4 weeks of uninterrupted OAC and was scheduled for DCCV which was completed on 01/08/2022.  He converted to sinus rhythm and reported doing well since conversion.  He was last seen by Tereso Newcomer, PA-C on 02/2022 for follow-up after undergoing DCCV on 01/08/2022.  During visit patient's blood pressure was controlled and he was continued on Cardizem and uninterrupted anticoagulation for the next 6 months.  Chad Huang presents today for  58-month follow-up with his daughter.  Since last being seen in the office patient reports that he has been doing well with no new cardiac complaints since his previous visit.  His blood pressure today is well-controlled at 120/78 and heart rate was 93 bpm.  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  He is also an church member and cuts his own grass.  He recently tripped over a rug and fell but did not strike his head.  He is advised and aware that if he strikes his head ever while on Eliquis 2 follow-up in the ED.  Patient denies chest pain, palpitations, dyspnea, PND, orthopnea, nausea, vomiting, dizziness, syncope, edema, weight gain, or early satiety.  Home Medications    Current Outpatient Medications  Medication Sig Dispense Refill   apixaban (ELIQUIS) 5 MG TABS tablet Take 1 tablet  (5 mg total) by mouth 2 (two) times daily. 180 tablet 1   atorvastatin (LIPITOR) 20 MG tablet Take 1 tablet (20 mg total) by mouth daily. 90 tablet 3   diltiazem (CARDIZEM CD) 180 MG 24 hr capsule Take 180 mg by mouth daily.     fluticasone (FLONASE) 50 MCG/ACT nasal spray PLACE 1 SPRAY INTO BOTH NOSTRILS 2 TIMES DAILY AS NEEDED FOR ALLERGIES OR RHINITIS. 48 mL 2   pantoprazole (PROTONIX) 40 MG tablet TAKE 1 TABLET BY MOUTH  DAILY FOR HEARTBURN 90 tablet 3   tamsulosin (FLOMAX) 0.4 MG CAPS capsule Take 0.4 mg by mouth daily as needed (Bladder problems).     triamcinolone ointment (KENALOG) 0.5 % Apply 1 Application topically 2 (two) times daily. 30 g 2   No current facility-administered medications for this visit.     Review of Systems  Please see the history of present illness.      All other systems reviewed and are otherwise negative except as noted above.  Physical Exam    Wt Readings from Last 3 Encounters:  07/27/22 226 lb (102.5 kg)  02/26/22 222 lb (100.7 kg)  01/08/22 217 lb (98.4 kg)   ZO:XWRUE were no vitals filed for this visit.,There is no height or weight on file to calculate BMI.  Constitutional:      Appearance: Healthy appearance. Not in distress.  Neck:     Vascular: JVD normal.  Pulmonary:     Effort: Pulmonary effort is normal.     Breath sounds: No wheezing. No rales. Diminished in the bases Cardiovascular:     Normal rate. Regular rhythm. Normal S1. Normal S2.      Murmurs: There is no murmur.  Edema:    Peripheral edema absent.  Abdominal:     Palpations: Abdomen is soft non tender. There is no hepatomegaly.  Skin:    General: Skin is warm and dry.  Neurological:     General: No focal deficit present.     Mental Status: Alert and oriented to person, place and time.     Cranial Nerves: Cranial nerves are intact.  EKG/LABS/ Recent Cardiac Studies    ECG personally reviewed by me today -none completed today  Cardiac Studies & Procedures        ECHOCARDIOGRAM  ECHOCARDIOGRAM COMPLETE 12/11/2021  Narrative ECHOCARDIOGRAM REPORT    Patient Name:   Chad Huang  Date of Exam: 12/11/2021 Medical Rec #:  [id number]     Height:       70.0 in Accession #:    [id number]    Weight:       221.6  lb Date of Birth:  [birth year]    BSA:          2.180 m Patient Age:    80 years      BP:           128/82 mmHg Patient Gender: M             HR:           94 bpm. Exam Location:  [address]  Procedure: 2D Echo, Cardiac Doppler and Color Doppler  Indications:    I48.91* Unspecified atrial fibrillation  History:        Patient has no prior history of Echocardiogram examinations. Signs/Symptoms:Chest Pain; Risk Factors:Hypertension and Dyslipidemia. Prediabetes.  Sonographer:    Cathie Beams RCS Referring Phys: Cornelious Bryant TURNER  IMPRESSIONS   1. Left ventricular ejection fraction, by estimation, is 55 to 60%. The left ventricle has normal function. The left ventricle has no regional wall motion abnormalities. There is mild left ventricular hypertrophy. Left ventricular diastolic parameters are indeterminate. 2. Right ventricular systolic function is normal. The right ventricular size is normal. Tricuspid regurgitation signal is inadequate for assessing PA pressure. 3. The mitral valve is normal in structure. Trivial mitral valve regurgitation. No evidence of mitral stenosis. 4. The aortic valve is grossly normal. Aortic valve regurgitation is trivial. No aortic stenosis is present. 5. Aortic dilatation noted. There is borderline dilatation of the aortic root, measuring 40 mm. 6. The inferior vena cava is normal in size with greater than 50% respiratory variability, suggesting right atrial pressure of 3 mmHg.  FINDINGS Left Ventricle: Left ventricular ejection fraction, by estimation, is 55 to 60%. The left ventricle has normal function. The left ventricle has no regional wall motion abnormalities. The left ventricular internal cavity  size was normal in size. There is mild left ventricular hypertrophy. Left ventricular diastolic parameters are indeterminate.  Right Ventricle: The right ventricular size is normal. No increase in right ventricular wall thickness. Right ventricular systolic function is normal. Tricuspid regurgitation signal is inadequate for assessing PA pressure.  Left Atrium: Left atrial size was normal in size.  Right Atrium: Right atrial size was normal in size.  Pericardium: There is no evidence of pericardial effusion.  Mitral Valve: The mitral valve is normal in structure. Trivial mitral valve regurgitation. No evidence of mitral valve stenosis.  Tricuspid Valve: The tricuspid valve is normal in structure. Tricuspid valve regurgitation is trivial. No evidence of tricuspid stenosis.  Aortic Valve: The aortic valve is grossly normal. Aortic valve regurgitation is trivial. No aortic stenosis is present.  Pulmonic Valve: The pulmonic valve was normal in structure. Pulmonic valve regurgitation is trivial. No evidence of pulmonic stenosis.  Aorta: Aortic dilatation noted. There is borderline dilatation of the aortic root, measuring 40 mm.  Venous: The inferior vena cava is normal in size with greater than 50% respiratory variability, suggesting right atrial pressure of 3 mmHg.  IAS/Shunts: The atrial septum is grossly normal.   LEFT VENTRICLE PLAX 2D LVIDd:         4.70 cm LVIDs:         3.20 cm LV PW:         1.20 cm LV IVS:        1.10 cm LVOT diam:     2.00 cm LV SV:         57 LV SV Index:   26 LVOT Area:     3.14 cm   RIGHT VENTRICLE RV Basal diam:  2.80  cm RV S prime:     12.10 cm/s TAPSE (M-mode): 1.8 cm  LEFT ATRIUM             Index        RIGHT ATRIUM           Index LA diam:        4.50 cm 2.06 cm/m   RA Area:     15.80 cm LA Vol (A2C):   64.1 ml 29.40 ml/m  RA Volume:   34.40 ml  15.78 ml/m LA Vol (A4C):   66.3 ml 30.41 ml/m LA Biplane Vol: 67.9 ml 31.14 ml/m AORTIC  VALVE LVOT Vmax:   94.80 cm/s LVOT Vmean:  61.867 cm/s LVOT VTI:    0.183 m  AORTA Ao Root diam: 4.00 cm Ao Asc diam:  3.10 cm   SHUNTS Systemic VTI:  0.18 m Systemic Diam: 2.00 cm  Weston Brass MD Electronically signed by Weston Brass MD Signature Date/Time: 12/11/2021/9:33:38 PM    Final     CT SCANS  CT CARDIAC SCORING (SELF PAY ONLY) 12/14/2021  Addendum 12/14/2021 12:28 PM ADDENDUM REPORT: 12/14/2021 12:26  CLINICAL DATA:  Cardiovascular Disease Risk stratification  EXAM: Coronary Calcium Score  TECHNIQUE: A gated, non-contrast computed tomography scan of the heart was performed using 3mm slice thickness. Axial images were analyzed on a dedicated workstation. Calcium scoring of the coronary arteries was performed using the Agatston method.  FINDINGS: Coronary arteries: Normal origins.  Coronary Calcium Score:  Left main: 0  Left anterior descending artery: 0  Left circumflex artery: 0  Right coronary artery: 0  Total: 0  Percentile: 0  Pericardium: Normal.  Aorta: Normal caliber of ascending aorta. No aortic atherosclerosis noted.  Non-cardiac: See separate report from [facility].  IMPRESSION: Coronary calcium score of 0. This was 0 percentile for age-, race-, and sex-matched controls.  RECOMMENDATIONS: Coronary artery calcium (CAC) score is a strong predictor of incident coronary heart disease (CHD) and provides predictive information beyond traditional risk factors. CAC scoring is reasonable to use in the decision to withhold, postpone, or initiate statin therapy in intermediate-risk or selected borderline-risk asymptomatic adults (age 22-75 years and LDL-C >=70 to <190 mg/dL) who do not have diabetes or established atherosclerotic cardiovascular disease (ASCVD).* In intermediate-risk (10-year ASCVD risk >=7.5% to <20%) adults or selected borderline-risk (10-year ASCVD risk >=5% to <7.5%) adults in whom a CAC score is  measured for the purpose of making a treatment decision the following recommendations have been made:  If CAC=0, it is reasonable to withhold statin therapy and reassess in 5 to 10 years, as long as higher risk conditions are absent (diabetes mellitus, family history of premature CHD in first degree relatives (males <55 years; females <65 years), cigarette smoking, or LDL >=190 mg/dL).  If CAC is 1 to 99, it is reasonable to initiate statin therapy for patients >=86 years of age.  If CAC is >=100 or >=75th percentile, it is reasonable to initiate statin therapy at any age.  Cardiology referral should be considered for patients with CAC scores >=400 or >=75th percentile.  *2018 AHA/ACC/AACVPR/AAPA/ABC/ACPM/ADA/AGS/APhA/ASPC/NLA/PCNA Guideline on the Management of Blood Cholesterol: A Report of the American College of Cardiology/American Heart Association Task Force on Clinical Practice Guidelines. J Am Coll Cardiol. 2019;73(24):3168-3209.  Jodelle Red, MD   Electronically Signed By: Jodelle Red M.D. On: 12/14/2021 12:26  Narrative EXAM: OVER-READ INTERPRETATION  CT CHEST  The following report is a limited chest CT over-read performed by radiologist Dr. Ronaldo Miyamoto  [facility], [place] on 12/11/2021. This over-read does not include interpretation of cardiac or coronary anatomy or pathology. The calcium score interpretation by the cardiologist is attached.  COMPARISON:  None Available.  FINDINGS: Vascular: Normal aortic caliber.  Mediastinum/Nodes: No imaged thoracic adenopathy.  Lungs/Pleura: No pleural fluid. 2 mm right upper lobe pulmonary nodule on 02/04.  Upper Abdomen: Normal imaged portions of the liver, spleen, stomach.  Musculoskeletal: No acute osseous abnormality.  IMPRESSION: No acute findings in the imaged extracardiac chest.  2 mm right upper lobe pulmonary nodule. No follow-up needed if patient is low-risk.  Non-contrast chest CT can be considered in 12 months if patient is high-risk. This recommendation follows the consensus statement: Guidelines for Management of Incidental Pulmonary Nodules Detected on CT Images: From the Fleischner Society 2017; Radiology 2017; 284:228-243.  Electronically Signed: By: Jeronimo Greaves M.D. On: 12/11/2021 16:46          Risk Assessment/Calculations:    CHA2DS2-VASc Score = 3   This indicates a 3.2% annual risk of stroke. The patient's score is based upon: CHF History: 0 HTN History: 1 Diabetes History: 0 Stroke History: 0 Vascular Disease History: 0 Age Score: 2 Gender Score: 0           Lab Results  Component Value Date   WBC 8.0 01/07/2022   HGB 16.3 01/07/2022   HCT 49.3 01/07/2022   MCV 87 01/07/2022   PLT 197 01/07/2022   Lab Results  Component Value Date   CREATININE 1.20 01/07/2022   BUN 12 01/07/2022   NA 143 01/07/2022   K 3.9 01/07/2022   CL 104 01/07/2022   CO2 21 01/07/2022   Lab Results  Component Value Date   ALT 13 01/07/2022   AST 16 11/20/2021   ALKPHOS 73 11/20/2021   BILITOT 1.0 11/20/2021   Lab Results  Component Value Date   CHOL 118 01/07/2022   HDL 34 (L) 01/07/2022   LDLCALC 61 01/07/2022   TRIG 125 01/07/2022   CHOLHDL 3.5 01/07/2022    Lab Results  Component Value Date   HGBA1C 5.9 11/20/2021     Assessment & Plan    1.  Paroxysmal atrial fibrillation: -s/p DCCV 12/2021 with conversion to sinus rhythm.  Today patient reports no recurrence of tachycardia since previous visit. -We will have patient wear a 30-day event monitor to evaluate for AF burden. -Continue rate control with Cardizem 180 mg daily -Patient's last hemoglobin was 16.3 and creatinine was 1.2 -Continue Eliquis 5 mg twice daily  2.  Essential hypertension: -Patient's blood pressure today was well-controlled at 120/78 -Continue Cardizem 180 mg daily  3.  Dilated aortic root: -Previously measured at 40 mm -Patient will  have follow-up 2D echo completed on 11/2022  4.  Hyperlipidemia: -Patient's last LDL cholesterol was 61 -Continue Lipitor 20 mg daily  5.  History of prostate CA: -Reports no residual effects or relapses  Disposition: Follow-up with Armanda Magic, MD or APP in 6 months    Medication Adjustments/Labs and Tests Ordered: Current medicines are reviewed at length with the patient today.  Concerns regarding medicines are outlined above.   Signed, Napoleon Form, Leodis Rains, NP 09/16/2022, 11:46 AM [facility]

## 2022-09-17 ENCOUNTER — Ambulatory Visit: Payer: Medicare Other | Attending: Nurse Practitioner | Admitting: Nurse Practitioner

## 2022-09-17 ENCOUNTER — Encounter: Payer: Self-pay | Admitting: Nurse Practitioner

## 2022-09-17 VITALS — BP 120/78 | HR 93 | Ht 70.5 in | Wt 225.6 lb

## 2022-09-17 DIAGNOSIS — E7800 Pure hypercholesterolemia, unspecified: Secondary | ICD-10-CM | POA: Diagnosis not present

## 2022-09-17 DIAGNOSIS — I77810 Thoracic aortic ectasia: Secondary | ICD-10-CM | POA: Diagnosis not present

## 2022-09-17 DIAGNOSIS — I4891 Unspecified atrial fibrillation: Secondary | ICD-10-CM | POA: Diagnosis not present

## 2022-09-17 DIAGNOSIS — I4819 Other persistent atrial fibrillation: Secondary | ICD-10-CM

## 2022-09-17 DIAGNOSIS — K219 Gastro-esophageal reflux disease without esophagitis: Secondary | ICD-10-CM | POA: Diagnosis not present

## 2022-09-17 DIAGNOSIS — Z8546 Personal history of malignant neoplasm of prostate: Secondary | ICD-10-CM

## 2022-09-17 DIAGNOSIS — I10 Essential (primary) hypertension: Secondary | ICD-10-CM

## 2022-09-17 MED ORDER — PANTOPRAZOLE SODIUM 40 MG PO TBEC
DELAYED_RELEASE_TABLET | ORAL | 2 refills | Status: DC
Start: 2022-09-17 — End: 2023-06-28

## 2022-09-17 MED ORDER — ATORVASTATIN CALCIUM 20 MG PO TABS: 20.0000 mg | ORAL_TABLET | Freq: Every day | ORAL | 2 refills | Status: DC

## 2022-09-17 MED ORDER — APIXABAN 5 MG PO TABS: 5.0000 mg | ORAL_TABLET | Freq: Two times a day (BID) | ORAL | 2 refills | Status: DC

## 2022-09-17 MED ORDER — DILTIAZEM HCL ER COATED BEADS 180 MG PO CP24: 180.0000 mg | ORAL_CAPSULE | Freq: Every day | ORAL | 2 refills | Status: DC

## 2022-09-17 NOTE — Patient Instructions (Addendum)
Medication Instructions:  Your physician recommends that you continue on your current medications as directed. Please refer to the Current Medication list given to you today. *If you need a refill on your cardiac medications before your next appointment, please call your pharmacy*   Lab Work: None ordered   Testing/Procedures: Your physician has recommended that you wear an 30 day event monitor. Event monitors are medical devices that record the heart's electrical activity. Doctors most often [place] these monitors to diagnose arrhythmias. Arrhythmias are problems with the speed or rhythm of the heartbeat. The monitor is a small, portable device. You can wear one while you do your normal daily activities. This is usually used to diagnose what is causing palpitations/syncope (passing out).   Follow-Up: At [facility] you and your health needs are our priority.  As part of our continuing mission to provide you with exceptional heart care, we have created designated Provider Care Teams.  These Care Teams include your primary Cardiologist (physician) and Advanced Practice Providers (APPs -  Physician Assistants and Nurse Practitioners) who all work together to provide you with the care you need, when you need it.  We recommend signing up for the patient portal called "MyChart".  Sign up information is provided on this After Visit Summary.  MyChart is used to connect with patients for Virtual Visits (Telemedicine).  Patients are able to view lab/test results, encounter notes, upcoming appointments, etc.  Non-urgent messages can be sent to your provider as well.   To learn more about what you can do with MyChart, go to [email or web address].    Your next appointment:   6 month(s)  Provider:   Armanda Magic, MD     Other Instructions

## 2022-10-01 ENCOUNTER — Telehealth: Payer: Self-pay | Admitting: Cardiology

## 2022-10-01 DIAGNOSIS — Z8546 Personal history of malignant neoplasm of prostate: Secondary | ICD-10-CM

## 2022-10-01 DIAGNOSIS — K219 Gastro-esophageal reflux disease without esophagitis: Secondary | ICD-10-CM

## 2022-10-01 DIAGNOSIS — I10 Essential (primary) hypertension: Secondary | ICD-10-CM

## 2022-10-01 DIAGNOSIS — I4891 Unspecified atrial fibrillation: Secondary | ICD-10-CM | POA: Diagnosis not present

## 2022-10-01 DIAGNOSIS — I77810 Thoracic aortic ectasia: Secondary | ICD-10-CM

## 2022-10-01 DIAGNOSIS — E7800 Pure hypercholesterolemia, unspecified: Secondary | ICD-10-CM

## 2022-10-01 DIAGNOSIS — I4819 Other persistent atrial fibrillation: Secondary | ICD-10-CM | POA: Diagnosis not present

## 2022-10-01 NOTE — Telephone Encounter (Signed)
Calling with critical EKG results. Call transferred 

## 2022-10-01 NOTE — Telephone Encounter (Signed)
   Cardiac Monitor Alert  Date of alert:  10/01/2022   Patient Name: Chad Huang  DOB: 02/02/1941  MRN: [id number]   [facility] Cardiologist: Armanda Magic, MD   [facility] EP:  None    Monitor Information: Cardiac Event Monitor [[employer]]  Reason:  PAF Ordering provider:  Earley Abide Turner,MD   Alert Atrial Fibrillation/Flutter This is the 1st alert for this rhythm.  The patient has a hx of Atrial Fibrillation/Flutter. At 1141 CT with rate of 70-80bpm.   Anticoagulation medication as of 10/01/2022           apixaban (ELIQUIS) 5 MG TABS tablet Take 1 tablet (5 mg total) by mouth 2 (two) times daily.       Next Cardiology Appointment   Date:  03/10/2023  Provider:  Dr Mayford Knife  The patient was contacted today.  He is asymptomatic. Spoke with pt's daughter, DPR who reports monitor was not placed on pt until 1245pm or later.  Other: Awaiting fax  Alois Cliche, RN  10/01/2022 1:16 PM

## 2022-10-13 ENCOUNTER — Encounter: Payer: Self-pay | Admitting: Cardiology

## 2022-10-18 ENCOUNTER — Ambulatory Visit (INDEPENDENT_AMBULATORY_CARE_PROVIDER_SITE_OTHER): Payer: Medicare Other

## 2022-10-18 VITALS — Ht 70.5 in | Wt 219.0 lb

## 2022-10-18 DIAGNOSIS — Z0000 Encounter for general adult medical examination without abnormal findings: Secondary | ICD-10-CM | POA: Diagnosis not present

## 2022-10-18 DIAGNOSIS — R3915 Urgency of urination: Secondary | ICD-10-CM | POA: Diagnosis not present

## 2022-10-18 NOTE — Progress Notes (Signed)
I connected with  Lowella Petties Haworth on 10/18/22 by a audio enabled telemedicine application and verified that I am speaking with the correct person using two identifiers.  Patient Location: Home  Provider Location: Home Office  I discussed the limitations of evaluation and management by telemedicine. The patient expressed understanding and agreed to proceed.  Subjective:   CLAUDEL BELKA is a 82 y.o. male who presents for Medicare Annual/Subsequent preventive examination.  Review of Systems      Cardiac Risk Factors include: advanced age (>28men, >20 women);hypertension;male gender;sedentary lifestyle     Objective:    Today's Vitals   10/18/22 1138  Weight: 219 lb (99.3 kg)  Height: 5' 10.5" (1.791 m)   Body mass index is 30.98 kg/m.     10/18/2022   11:50 AM 01/08/2022    7:26 AM 10/16/2021   10:03 AM 10/15/2020    9:52 AM 05/21/2020   11:09 AM 10/12/2019   10:39 AM 10/11/2018    1:52 PM  Advanced Directives  Does Patient Have a Medical Advance Directive? Yes Yes Yes Yes No Yes Yes  Type of Estate agent of [place];Living will Living will Healthcare Power of [place];Living will Healthcare Power of [place];Living will  Healthcare Power of [place];Living will Healthcare Power of [place];Living will  Does patient want to make changes to medical advance directive? No - Patient declined  Yes (Inpatient - patient defers changing a medical advance directive and declines information at this time)      Copy of Healthcare Power of Attorney in Chart? Yes - validated most recent copy scanned in chart (See row information)  No - copy requested Yes - validated most recent copy scanned in chart (See row information)  Yes - validated most recent copy scanned in chart (See row information) No - copy requested    Current Medications (verified) Outpatient Encounter Medications as of 10/18/2022  Medication Sig   apixaban (ELIQUIS) 5 MG TABS tablet Take 1 tablet (5 mg total) by mouth  2 (two) times daily.   atorvastatin (LIPITOR) 20 MG tablet Take 1 tablet (20 mg total) by mouth daily.   diltiazem (CARDIZEM CD) 180 MG 24 hr capsule Take 1 capsule (180 mg total) by mouth daily.   fluticasone (FLONASE) 50 MCG/ACT nasal spray PLACE 1 SPRAY INTO BOTH NOSTRILS 2 TIMES DAILY AS NEEDED FOR ALLERGIES OR RHINITIS.   pantoprazole (PROTONIX) 40 MG tablet Pt take 1 tablet by mouth daily for heartburn.   tamsulosin (FLOMAX) 0.4 MG CAPS capsule Take 0.4 mg by mouth daily as needed (Bladder problems).   triamcinolone ointment (KENALOG) 0.5 % Apply 1 Application topically 2 (two) times daily.   No facility-administered encounter medications on file as of 10/18/2022.    Allergies (verified) Augmentin [amoxicillin-pot clavulanate], Clarithromycin, Codeine phosphate, and Nabumetone   History: Past Medical History:  Diagnosis Date   Alcohol abuse    Anxiety and depression    charter, admission   Back pain    ([place]zer), microdiskectomy-great result   Benign prostatic hypertrophy    per Dr Annabell Howells with uro   Cataracts, bilateral    Cerumen impaction    left   Chest pain    Dilated aortic root (HCC)    40 mm by echo 11/2021   GERD (gastroesophageal reflux disease)    Hearing loss    Hiatal hernia    nodule   HTN (hypertension)    Hyperlipidemia, mixed    Inguinal hernia    left, hx of, repair  Internal hemorrhoids    Left ear pain    Muscle disorder    disorder, muscle/ligaminet nis, rt lower extremity   Prostate cancer (HCC) 05/17/2008   Dr Annabell Howells   Skin cancer    hx, left ear   Past Surgical History:  Procedure Laterality Date   back microdiskectomy  10/01   [place]   BACK SURGERY     CARDIOVASCULAR STRESS TEST  2009   [place]   CARDIOVERSION N/A 01/08/2022   Procedure: CARDIOVERSION;  Surgeon: Pricilla Riffle, MD;  Location[facility] END[facility]COPY;  Service: Cardiovascular;  Laterality: N/A;   COLONOSCOPY  5/05   polyps int hemorrhoids, repeat 10 yrs    ESOPHAGOGASTRODUODENOSCOPY  03/02/00   gastritis, chronic   ESOPHAGOGASTRODUODENOSCOPY  5/05   polyps in antrum, neg H Pylori   ETT  05/19/07   Adenosine Muoview   H Pylori positive  8/98   HERNIA REPAIR     LAPAROSCOPIC INGUINAL HERNIA REPAIR  10/96   levitin eval  02/05/99   hip pain   LUMBAR SPINE SURGERY  5/00   MRI  10/07/98   degen changes 9Dr. Madelon Lips) steroid injections   MYELOGRAM  9/01   stenosis L4-5   Family History  Problem Relation Age of Onset   Prostate cancer Father    Stroke Other        grandmother   Lung cancer Sister    Heart disease Brother    Social History   Socioeconomic History   Marital status: Widowed    Spouse name: Not on file   Number of children: Not on file   Years of education: Not on file   Highest education level: Not on file  Occupational History   Not on file  Tobacco Use   Smoking status: Former    Packs/day: 1.00    Years: 20.00    Additional pack years: 0.00    Total pack years: 20.00    Types: Cigarettes   Smokeless tobacco: Current    Types: Chew   Tobacco comments:    quit in 1973  Vaping Use   Vaping Use: Never used  Substance and Sexual Activity   Alcohol use: No    Comment: none since 1996   Drug use: No   Sexual activity: Not Currently  Other Topics Concern   Not on file  Social History Narrative   Married,    One daughter.    1 granddaughter.   Retired. Once worked as a Psychologist, occupational.   Active.      [phone] (c?)   Social Determinants of Health   Financial Resource Strain: Low Risk  (10/18/2022)   Overall Financial Resource Strain (CARDIA)    Difficulty of Paying Living Expenses: Not hard at all  Food Insecurity: No Food Insecurity (10/18/2022)   Hunger Vital Sign    Worried About Running Out of Food in the Last Year: Never true    Ran Out of Food in the Last Year: Never true  Transportation Needs: No Transportation Needs (10/18/2022)   PRAPARE - Administrator, Civil Service (Medical): No     Lack of Transportation (Non-Medical): No  Physical Activity: Inactive (10/18/2022)   Exercise Vital Sign    Days of Exercise per Week: 2 days    Minutes of Exercise per Session: 0 min  Stress: No Stress Concern Present (10/18/2022)   [employer] of Occupational Health - Occupational Stress Questionnaire    Feeling of Stress : Not at all  Social  Connections: Moderately Isolated (10/18/2022)   Social Connection and Isolation Panel [NHANES]    Frequency of Communication with Friends and Family: More than three times a week    Frequency of Social Gatherings with Friends and Family: More than three times a week    Attends Religious Services: More than 4 times per year    Active Member of [employer] or Organizations: No    Attends Banker Meetings: Never    Marital Status: Widowed    Tobacco Counseling Ready to quit: Not Answered Counseling given: Not Answered Tobacco comments: quit in 1973   Clinical Intake:  Pre-visit preparation completed: Yes  Pain : No/denies pain     Nutritional Risks: None Diabetes: No  How often do you need to have someone help you when you read instructions, pamphlets, or other written materials from your doctor or pharmacy?: 1 - Never  Diabetic? no  Interpreter Needed?: No  Information entered by :: C.Zamariyah Furukawa LPN   Activities of Daily Living    10/18/2022   11:50 AM  In your present state of health, do you have any difficulty performing the following activities:  Hearing? 1  Comment has some hearing loss  Vision? 0  Difficulty concentrating or making decisions? 0  Walking or climbing stairs? 0  Dressing or bathing? 0  Doing errands, shopping? 0  Preparing Food and eating ? N  Using the Toilet? N  In the past six months, have you accidently leaked urine? N  Do you have problems with loss of bowel control? N  Managing your Medications? N  Managing your Finances? N  Housekeeping or managing your Housekeeping? N    Patient Care  Team: Doreene Nest, NP as PCP - General (Internal Medicine) Quintella Reichert, MD as PCP - Cardiology (Cardiology) Blair Promise, OD as Consulting Physician (Optometry) Meryl Dare, MD as Consulting Physician (Gastroenterology) Drema Halon, MD (Inactive) as Consulting Physician (Otolaryngology) Bjorn Pippin, MD as Attending Physician (Urology) Aliene Beams, MD as Referring Physician (Neurosurgery)  Indicate any recent Medical Services you may have received from other than [facility] providers in the past year (date may be approximate).     Assessment:   This is a routine wellness examination for Kassem.  Hearing/Vision screen Hearing Screening - Comments:: Has hearing issue but no aids Vision Screening - Comments:: No - [facility]  Dietary issues and exercise activities discussed: Current Exercise Habits: Home exercise routine, Type of exercise: strength training/weights, Time (Minutes): 15, Intensity: Mild, Exercise limited by: None identified   Goals Addressed             This Visit's Progress    Patient Stated       No new goals       Depression Screen    10/18/2022   11:49 AM 10/16/2021   10:00 AM 10/15/2020    9:54 AM 10/12/2019   10:42 AM 10/11/2018    1:40 PM 09/26/2017   10:50 AM 09/22/2016   10:45 AM  PHQ 2/9 Scores  PHQ - 2 Score 0 0 0 0 0 0 0  PHQ- 9 Score  0 0 0 0 0     Fall Risk    10/18/2022   11:44 AM 10/16/2021   10:06 AM 10/15/2020    9:53 AM 10/12/2019   10:40 AM 10/11/2018    1:40 PM  Fall Risk   Falls in the past year? 1 0 0 0 0  Comment tripped on area rug  Number falls in past yr: 0 0 0 0   Injury with Fall? 0 0 0 0   Risk for fall due to : No Fall Risks No Fall Risks Medication side effect Medication side effect   Follow up Falls evaluation completed;Falls prevention discussed;Education provided Falls evaluation completed Falls evaluation completed;Falls prevention discussed Falls evaluation completed;Falls prevention  discussed     FALL RISK PREVENTION PERTAINING TO THE HOME:  Any stairs in or around the home? No  If so, are there any without handrails? No  Home free of loose throw rugs in walkways, pet beds, electrical cords, etc? Yes  Adequate lighting in your home to reduce risk of falls? Yes   ASSISTIVE DEVICES UTILIZED TO PREVENT FALLS:  Life alert? No  Use of a cane, walker or w/c? No  Grab bars in the bathroom? Yes  Shower chair or bench in shower? No  Elevated toilet seat or a handicapped toilet? No    Cognitive Function:    10/15/2020   10:04 AM 10/12/2019   10:43 AM 10/11/2018    1:52 PM 09/26/2017   10:57 AM 09/22/2016   10:45 AM  MMSE - Mini Mental State Exam  Not completed: Refused      Orientation to time  5 5 5 5   Orientation to Place  5 5 5 5   Registration  3 3 3 3   Attention/ Calculation  5 0 0 5  Recall  3 3 3 3   Language- name 2 objects   0 0 0  Language- repeat  1 1 1 1   Language- follow 3 step command   0 3 3  Language- read & follow direction   0 0 0  Write a sentence   0 0 0  Copy design   0 0 0  Total score   17 20 25         10/18/2022   11:51 AM  6CIT Screen  What Year? 0 points  What month? 0 points  What time? 0 points  Count back from 20 4 points  Months in reverse 0 points  Repeat phrase 0 points  Total Score 4 points    Immunizations Immunization History  Administered Date(s) Administered   Influenza-Unspecified 05/31/2016   Pneumococcal Conjugate-13 09/22/2016   Pneumococcal Polysaccharide-23 05/17/2008   Td 06/17/1993, 09/18/2008    TDAP status: Due, Education has been provided regarding the importance of this vaccine. Advised may receive this vaccine at local pharmacy or Health Dept. Aware to provide a copy of the vaccination record if obtained from local pharmacy or Health Dept. Verbalized acceptance and understanding.  Flu Vaccine status: Declined, Education has been provided regarding the importance of this vaccine but patient still  declined. Advised may receive this vaccine at local pharmacy or Health Dept. Aware to provide a copy of the vaccination record if obtained from local pharmacy or Health Dept. Verbalized acceptance and understanding.  Pneumococcal vaccine status: Up to date  Covid-19 vaccine status: Declined, Education has been provided regarding the importance of this vaccine but patient still declined. Advised may receive this vaccine at local pharmacy or Health Dept.or vaccine clinic. Aware to provide a copy of the vaccination record if obtained from local pharmacy or Health Dept. Verbalized acceptance and understanding.  Qualifies for Shingles Vaccine? Yes   Zostavax completed No   Shingrix Completed?: No.    Education has been provided regarding the importance of this vaccine. Patient has been advised to call insurance company to determine out of  pocket expense if they have not yet received this vaccine. Advised may also receive vaccine at local pharmacy or Health Dept. Verbalized acceptance and understanding.  Screening Tests Health Maintenance  Topic Date Due   COVID-19 Vaccine (1) Never done   DTaP/Tdap/Td (3 - Tdap) 09/19/2018   INFLUENZA VACCINE  12/16/2022   Medicare Annual Wellness (AWV)  10/18/2023   Pneumonia Vaccine 24+ Years old  Completed   HPV VACCINES  Aged Out   Zoster Vaccines- Shingrix  Discontinued    Health Maintenance  Health Maintenance Due  Topic Date Due   COVID-19 Vaccine (1) Never done   DTaP/Tdap/Td (3 - Tdap) 09/19/2018    Colorectal cancer screening: No longer required.   Lung Cancer Screening: (Low Dose CT Chest recommended if Age 39-80 years, 30 pack-year currently smoking OR have quit w/in 15years.) does not qualify.   Lung Cancer Screening Referral: no  Additional Screening:  Hepatitis C Screening: does not qualify; Completed no  Vision Screening: Recommended annual ophthalmology exams for early detection of glaucoma and other disorders of the eye. Is the  patient up to date with their annual eye exam?  Yes  Who is the provider or what is the name of the office in which the patient attends annual eye exams? [facility] If pt is not established with a provider, would they like to be referred to a provider to establish care? Yes .   Dental Screening: Recommended annual dental exams for proper oral hygiene  Community Resource Referral / Chronic Care Management: CRR required this visit?  No   CCM required this visit?  No      Plan:     I have personally reviewed and noted the following in the patient's chart:   Medical and social history Use of alcohol, tobacco or illicit drugs  Current medications and supplements including opioid prescriptions. Patient is not currently taking opioid prescriptions. Functional ability and status Nutritional status Physical activity Advanced directives List of other physicians Hospitalizations, surgeries, and ER visits in previous 12 months Vitals Screenings to include cognitive, depression, and falls Referrals and appointments  In addition, I have reviewed and discussed with patient certain preventive protocols, quality metrics, and best practice recommendations. A written personalized care plan for preventive services as well as general preventive health recommendations were provided to patient.     Maryan Puls, LPN   09/15/8839   Nurse Notes: none

## 2022-10-18 NOTE — Patient Instructions (Addendum)
Chad Huang , Thank you for taking time to come for your Medicare Wellness Visit. I appreciate your ongoing commitment to your health goals. Please review the following plan we discussed and let me know if I can assist you in the future.   These are the goals we discussed:  Goals      DIET - EAT MORE FRUITS AND VEGETABLES     Patient Stated     Starting 10/11/18, I will continue to take medications as prescribed.      Patient Stated     10/12/2019, I will continue to exercise daily for 30 minutes.      Patient Stated     10/15/2020, I will continue to walk daily for 30 minutes      Patient Stated     No new goals        This is a list of the screening recommended for you and due dates:  Health Maintenance  Topic Date Due   COVID-19 Vaccine (1) Never done   DTaP/Tdap/Td vaccine (3 - Tdap) 09/19/2018   Medicare Annual Wellness Visit  10/17/2022   Flu Shot  12/16/2022   Pneumonia Vaccine  Completed   HPV Vaccine  Aged Out   Zoster (Shingles) Vaccine  Discontinued    Advanced directives: in chart  Conditions/risks identified: Aim for 30 minutes of exercise or brisk walking, 6-8 glasses of water, and 5 servings of fruits and vegetables each day.   Next appointment: Follow up in one year for your annual wellness visit. 10/19/22 @ 1pm televisit  Preventive Care 65 Years and Older, Male  Preventive care refers to lifestyle choices and visits with your health care provider that can promote health and wellness. What does preventive care include? A yearly physical exam. This is also called an annual well check. Dental exams once or twice a year. Routine eye exams. Ask your health care provider how often you should have your eyes checked. Personal lifestyle choices, including: Daily care of your teeth and gums. Regular physical activity. Eating a healthy diet. Avoiding tobacco and drug use. Limiting alcohol use. Practicing safe sex. Taking low doses of aspirin every day. Taking  vitamin and mineral supplements as recommended by your health care provider. What happens during an annual well check? The services and screenings done by your health care provider during your annual well check will depend on your age, overall health, lifestyle risk factors, and family history of disease. Counseling  Your health care provider may ask you questions about your: Alcohol use. Tobacco use. Drug use. Emotional well-being. Home and relationship well-being. Sexual activity. Eating habits. History of falls. Memory and ability to understand (cognition). Work and work Astronomer. Screening  You may have the following tests or measurements: Height, weight, and BMI. Blood pressure. Lipid and cholesterol levels. These may be checked every 5 years, or more frequently if you are over 52 years old. Skin check. Lung cancer screening. You may have this screening every year starting at age 35 if you have a 30-pack-year history of smoking and currently smoke or have quit within the past 15 years. Fecal occult blood test (FOBT) of the stool. You may have this test every year starting at age 50. Flexible sigmoidoscopy or colonoscopy. You may have a sigmoidoscopy every 5 years or a colonoscopy every 10 years starting at age 44. Prostate cancer screening. Recommendations will vary depending on your family history and other risks. Hepatitis C blood test. Hepatitis B blood test. Sexually transmitted disease (  STD) testing. Diabetes screening. This is done by checking your blood sugar (glucose) after you have not eaten for a while (fasting). You may have this done every 1-3 years. Abdominal aortic aneurysm (AAA) screening. You may need this if you are a current or former smoker. Osteoporosis. You may be screened starting at age 81 if you are at high risk. Talk with your health care provider about your test results, treatment options, and if necessary, the need for more tests. Vaccines  Your  health care provider may recommend certain vaccines, such as: Influenza vaccine. This is recommended every year. Tetanus, diphtheria, and acellular pertussis (Tdap, Td) vaccine. You may need a Td booster every 10 years. Zoster vaccine. You may need this after age 11. Pneumococcal 13-valent conjugate (PCV13) vaccine. One dose is recommended after age 80. Pneumococcal polysaccharide (PPSV23) vaccine. One dose is recommended after age 13. Talk to your health care provider about which screenings and vaccines you need and how often you need them. This information is not intended to replace advice given to you by your health care provider. Make sure you discuss any questions you have with your health care provider. Document Released: 05/30/2015 Document Revised: 01/21/2016 Document Reviewed: 03/04/2015 Elsevier Interactive Patient Education  2017 [employer].  Fall Prevention in the Home Falls can cause injuries. They can happen to people of all ages. There are many things you can do to make your home safe and to help prevent falls. What can I do on the outside of my home? Regularly fix the edges of walkways and driveways and fix any cracks. Remove anything that might make you trip as you walk through a door, such as a raised step or threshold. Trim any bushes or trees on the path to your home. Use bright outdoor lighting. Clear any walking paths of anything that might make someone trip, such as rocks or tools. Regularly check to see if handrails are loose or broken. Make sure that both sides of any steps have handrails. Any raised decks and porches should have guardrails on the edges. Have any leaves, snow, or ice cleared regularly. Use sand or salt on walking paths during winter. Clean up any spills in your garage right away. This includes oil or grease spills. What can I do in the bathroom? Use night lights. Install grab bars by the toilet and in the tub and shower. Do not use towel bars as  grab bars. Use non-skid mats or decals in the tub or shower. If you need to sit down in the shower, use a plastic, non-slip stool. Keep the floor dry. Clean up any water that spills on the floor as soon as it happens. Remove soap buildup in the tub or shower regularly. Attach bath mats securely with double-sided non-slip rug tape. Do not have throw rugs and other things on the floor that can make you trip. What can I do in the bedroom? Use night lights. Make sure that you have a light by your bed that is easy to reach. Do not use any sheets or blankets that are too big for your bed. They should not hang down onto the floor. Have a firm chair that has side arms. You can use this for support while you get dressed. Do not have throw rugs and other things on the floor that can make you trip. What can I do in the kitchen? Clean up any spills right away. Avoid walking on wet floors. Keep items that you use a lot  in easy-to-reach places. If you need to reach something above you, use a strong step stool that has a grab bar. Keep electrical cords out of the way. Do not use floor polish or wax that makes floors slippery. If you must use wax, use non-skid floor wax. Do not have throw rugs and other things on the floor that can make you trip. What can I do with my stairs? Do not leave any items on the stairs. Make sure that there are handrails on both sides of the stairs and use them. Fix handrails that are broken or loose. Make sure that handrails are as long as the stairways. Check any carpeting to make sure that it is firmly attached to the stairs. Fix any carpet that is loose or worn. Avoid having throw rugs at the top or bottom of the stairs. If you do have throw rugs, attach them to the floor with carpet tape. Make sure that you have a light switch at the top of the stairs and the bottom of the stairs. If you do not have them, ask someone to add them for you. What else can I do to help prevent  falls? Wear shoes that: Do not have high heels. Have rubber bottoms. Are comfortable and fit you well. Are closed at the toe. Do not wear sandals. If you use a stepladder: Make sure that it is fully opened. Do not climb a closed stepladder. Make sure that both sides of the stepladder are locked into place. Ask someone to hold it for you, if possible. Clearly mark and make sure that you can see: Any grab bars or handrails. First and last steps. Where the edge of each step is. Use tools that help you move around (mobility aids) if they are needed. These include: Canes. Walkers. Scooters. Crutches. Turn on the lights when you go into a dark area. Replace any light bulbs as soon as they burn out. Set up your furniture so you have a clear path. Avoid moving your furniture around. If any of your floors are uneven, fix them. If there are any pets around you, be aware of where they are. Review your medicines with your doctor. Some medicines can make you feel dizzy. This can increase your chance of falling. Ask your doctor what other things that you can do to help prevent falls. This information is not intended to replace advice given to you by your health care provider. Make sure you discuss any questions you have with your health care provider. Document Released: 02/27/2009 Document Revised: 10/09/2015 Document Reviewed: 06/07/2014 Elsevier Interactive Patient Education  2017 [employer].

## 2022-10-20 ENCOUNTER — Other Ambulatory Visit: Payer: Self-pay | Admitting: Primary Care

## 2022-10-20 DIAGNOSIS — K219 Gastro-esophageal reflux disease without esophagitis: Secondary | ICD-10-CM

## 2022-11-04 ENCOUNTER — Ambulatory Visit: Payer: Medicare Other | Attending: Nurse Practitioner

## 2022-11-04 DIAGNOSIS — K219 Gastro-esophageal reflux disease without esophagitis: Secondary | ICD-10-CM

## 2022-11-04 DIAGNOSIS — I4891 Unspecified atrial fibrillation: Secondary | ICD-10-CM

## 2022-11-04 DIAGNOSIS — I4819 Other persistent atrial fibrillation: Secondary | ICD-10-CM

## 2022-11-04 DIAGNOSIS — Z8546 Personal history of malignant neoplasm of prostate: Secondary | ICD-10-CM

## 2022-11-04 DIAGNOSIS — E7800 Pure hypercholesterolemia, unspecified: Secondary | ICD-10-CM

## 2022-11-04 DIAGNOSIS — I77810 Thoracic aortic ectasia: Secondary | ICD-10-CM

## 2022-11-04 DIAGNOSIS — I10 Essential (primary) hypertension: Secondary | ICD-10-CM

## 2022-11-23 ENCOUNTER — Ambulatory Visit (INDEPENDENT_AMBULATORY_CARE_PROVIDER_SITE_OTHER): Payer: Medicare Other | Admitting: Primary Care

## 2022-11-23 ENCOUNTER — Encounter: Payer: Self-pay | Admitting: Primary Care

## 2022-11-23 VITALS — BP 132/86 | HR 105 | Temp 97.9°F | Ht 70.5 in | Wt 225.0 lb

## 2022-11-23 DIAGNOSIS — E7800 Pure hypercholesterolemia, unspecified: Secondary | ICD-10-CM | POA: Diagnosis not present

## 2022-11-23 DIAGNOSIS — R7303 Prediabetes: Secondary | ICD-10-CM

## 2022-11-23 DIAGNOSIS — K219 Gastro-esophageal reflux disease without esophagitis: Secondary | ICD-10-CM

## 2022-11-23 DIAGNOSIS — Z0000 Encounter for general adult medical examination without abnormal findings: Secondary | ICD-10-CM

## 2022-11-23 DIAGNOSIS — I77810 Thoracic aortic ectasia: Secondary | ICD-10-CM

## 2022-11-23 DIAGNOSIS — Z23 Encounter for immunization: Secondary | ICD-10-CM

## 2022-11-23 DIAGNOSIS — R911 Solitary pulmonary nodule: Secondary | ICD-10-CM | POA: Insufficient documentation

## 2022-11-23 DIAGNOSIS — I10 Essential (primary) hypertension: Secondary | ICD-10-CM

## 2022-11-23 DIAGNOSIS — I4819 Other persistent atrial fibrillation: Secondary | ICD-10-CM

## 2022-11-23 DIAGNOSIS — Z8546 Personal history of malignant neoplasm of prostate: Secondary | ICD-10-CM

## 2022-11-23 LAB — COMPREHENSIVE METABOLIC PANEL
ALT: 13 U/L (ref 0–53)
AST: 14 U/L (ref 0–37)
Albumin: 4.1 g/dL (ref 3.5–5.2)
Alkaline Phosphatase: 82 U/L (ref 39–117)
BUN: 18 mg/dL (ref 6–23)
CO2: 25 mEq/L (ref 19–32)
Calcium: 9.1 mg/dL (ref 8.4–10.5)
Chloride: 106 mEq/L (ref 96–112)
Creatinine, Ser: 1.34 mg/dL (ref 0.40–1.50)
GFR: 49.61 mL/min — ABNORMAL LOW (ref >60.00)
Glucose, Bld: 106 mg/dL — ABNORMAL HIGH (ref 70–99)
Potassium: 4 mEq/L (ref 3.5–5.1)
Sodium: 138 mEq/L (ref 135–145)
Total Bilirubin: 0.8 mg/dL (ref 0.2–1.2)
Total Protein: 6 g/dL (ref 6.0–8.3)

## 2022-11-23 LAB — CBC
HCT: 46.2 % (ref 39.0–52.0)
Hemoglobin: 15.2 g/dL (ref 13.0–17.0)
MCHC: 32.8 g/dL (ref 30.0–36.0)
MCV: 84.6 fl (ref 78.0–100.0)
Platelets: 204 10*3/uL (ref 150.0–400.0)
RBC: 5.46 Mil/uL (ref 4.22–5.81)
RDW: 14.1 % (ref 11.5–15.5)
WBC: 7.1 10*3/uL (ref 4.0–10.5)

## 2022-11-23 LAB — LIPID PANEL
Cholesterol: 116 mg/dL (ref 0–200)
HDL: 30.6 mg/dL — ABNORMAL LOW (ref >39.00)
LDL Cholesterol: 60 mg/dL (ref 0–99)
NonHDL: 85.73
Total CHOL/HDL Ratio: 4
Triglycerides: 131 mg/dL (ref 0.0–149.0)
VLDL: 26.2 mg/dL (ref 0.0–40.0)

## 2022-11-23 LAB — HEMOGLOBIN A1C: Hgb A1c MFr Bld: 5.9 % (ref 4.6–6.5)

## 2022-11-23 NOTE — Patient Instructions (Signed)
Stop by the lab prior to leaving today. I will notify you of your results once received.   It was a pleasure to see you today!  

## 2022-11-23 NOTE — Assessment & Plan Note (Signed)
Incidental finding on CT cardiac scan from July 2023. Discussed with patient today.  He declines the recommendation follow up CT chest.

## 2022-11-23 NOTE — Assessment & Plan Note (Signed)
Controlled.  Continue diltiazem 180 mg daily.

## 2022-11-23 NOTE — Assessment & Plan Note (Signed)
Following with Dr. Annabell Howells with Urology. PSA is UTD.

## 2022-11-23 NOTE — Assessment & Plan Note (Signed)
Following with cardiology.  Repeat echocardiogram pending for later this year.

## 2022-11-23 NOTE — Assessment & Plan Note (Signed)
Prevnar 20 provided today. PSA UTD, follows with Urology.   Discussed the importance of a healthy diet and regular exercise in order for weight loss, and to reduce the risk of further co-morbidity.  Exam stable. Labs pending.  Follow up in 1 year for repeat physical.

## 2022-11-23 NOTE — Assessment & Plan Note (Signed)
Repeat lipid panel pending. Continue atorvastatin 20 mg daily. 

## 2022-11-23 NOTE — Assessment & Plan Note (Addendum)
Following with cardiology, cardiac monitor report reviewed from June 2024 which predominantly revealed persistent atrial fibrillation. Continue diltiazem 180 mg daily for rate control. Continue Eliquis 5 mg BID given high stroke risk.

## 2022-11-23 NOTE — Progress Notes (Signed)
Subjective:    Patient ID: Chad Huang, male    DOB: January 23, 1941, 82 y.o.   MRN: [id number]  HPI  Chad Huang is a very pleasant 82 y.o. male who presents today for complete physical and follow up of chronic conditions.  Immunizations: -Shingles: Never completed  -Pneumonia: Completed Prevnar 13 in 2018, Pneumovax in 2010  Diet: Fair diet.  Exercise: No regular exercise.  Eye exam: Completes annually  Dental exam: Completed several years ago   Colonoscopy: Completed in 2005, no further testing given age  PSA: Due  BP Readings from Last 3 Encounters:  11/23/22 132/86  09/17/22 120/78  07/27/22 112/70         Review of Systems  Constitutional:  Negative for unexpected weight change.  HENT:  Negative for rhinorrhea.   Respiratory:  Negative for cough and shortness of breath.   Cardiovascular:  Negative for chest pain.  Gastrointestinal:  Negative for constipation and diarrhea.  Genitourinary:  Negative for difficulty urinating.  Musculoskeletal:  Negative for arthralgias and myalgias.  Skin:  Negative for rash.  Allergic/Immunologic: Negative for environmental allergies.  Neurological:  Negative for dizziness and headaches.  Psychiatric/Behavioral:  The patient is not nervous/anxious.          Past Medical History:  Diagnosis Date   Alcohol abuse    Anxiety and depression    charter, admission   Back pain    (Kritzer), microdiskectomy-great result   Benign prostatic hypertrophy    per Dr Annabell Howells with uro   Cataracts, bilateral    Cerumen impaction    left   Chest pain    Dilated aortic root (HCC)    40 mm by echo 11/2021   GERD (gastroesophageal reflux disease)    Hearing loss    Hiatal hernia    nodule   HTN (hypertension)    Hyperlipidemia, mixed    Inguinal hernia    left, hx of, repair   Internal hemorrhoids    Left ear pain    Muscle disorder    disorder, muscle/ligaminet nis, rt lower extremity   Prostate cancer (HCC) 05/17/2008   Dr  Annabell Howells   Skin cancer    hx, left ear    Social History   Socioeconomic History   Marital status: Widowed    Spouse name: Not on file   Number of children: Not on file   Years of education: Not on file   Highest education level: Not on file  Occupational History   Not on file  Tobacco Use   Smoking status: Former    Packs/day: 1.00    Years: 20.00    Additional pack years: 0.00    Total pack years: 20.00    Types: Cigarettes   Smokeless tobacco: Current    Types: Chew   Tobacco comments:    quit in 1973  Vaping Use   Vaping Use: Never used  Substance and Sexual Activity   Alcohol use: No    Comment: none since 1996   Drug use: No   Sexual activity: Not Currently  Other Topics Concern   Not on file  Social History Narrative   Married,    One daughter.    1 granddaughter.   Retired. Once worked as a Psychologist, occupational.   Active.      [phone] (c?)   Social Determinants of Health   Financial Resource Strain: Low Risk  (10/18/2022)   Overall Financial Resource Strain (CARDIA)    Difficulty of Paying Living  Expenses: Not hard at all  Food Insecurity: No Food Insecurity (10/18/2022)   Hunger Vital Sign    Worried About Running Out of Food in the Last Year: Never true    Ran Out of Food in the Last Year: Never true  Transportation Needs: No Transportation Needs (10/18/2022)   PRAPARE - Administrator, Civil Service (Medical): No    Lack of Transportation (Non-Medical): No  Physical Activity: Inactive (10/18/2022)   Exercise Vital Sign    Days of Exercise per Week: 2 days    Minutes of Exercise per Session: 0 min  Stress: No Stress Concern Present (10/18/2022)   [employer] of Occupational Health - Occupational Stress Questionnaire    Feeling of Stress : Not at all  Social Connections: Moderately Isolated (10/18/2022)   Social Connection and Isolation Panel [NHANES]    Frequency of Communication with Friends and Family: More than three times a week    Frequency  of Social Gatherings with Friends and Family: More than three times a week    Attends Religious Services: More than 4 times per year    Active Member of [employer] or Organizations: No    Attends Banker Meetings: Never    Marital Status: Widowed  Intimate Partner Violence: Not At Risk (10/18/2022)   Humiliation, Afraid, Rape, and Kick questionnaire    Fear of Current or Ex-Partner: No    Emotionally Abused: No    Physically Abused: No    Sexually Abused: No    Past Surgical History:  Procedure Laterality Date   back microdiskectomy  10/01   Kritzer   BACK SURGERY     CARDIOVASCULAR STRESS TEST  2009   [place]   CARDIOVERSION N/A 01/08/2022   Procedure: CARDIOVERSION;  Surgeon: Pricilla Riffle, MD;  Location: [facility] ENDOSCOPY;  Service: Cardiovascular;  Laterality: N/A;   COLONOSCOPY  5/05   polyps int hemorrhoids, repeat 10 yrs   ESOPHAGOGASTRODUODENOSCOPY  03/02/00   gastritis, chronic   ESOPHAGOGASTRODUODENOSCOPY  5/05   polyps in antrum, neg H Pylori   ETT  05/19/07   Adenosine Muoview   H Pylori positive  8/98   HERNIA REPAIR     LAPAROSCOPIC INGUINAL HERNIA REPAIR  10/96   levitin eval  02/05/99   hip pain   LUMBAR SPINE SURGERY  5/00   MRI  10/07/98   degen changes 9Dr. Madelon Lips) steroid injections   MYELOGRAM  9/01   stenosis L4-5    Family History  Problem Relation Age of Onset   Prostate cancer Father    Stroke Other        grandmother   Lung cancer Sister    Heart disease Brother     Allergies  Allergen Reactions   Augmentin [Amoxicillin-Pot Clavulanate] Diarrhea   Clarithromycin     REACTION: unspecified   Codeine Phosphate     REACTION: nausea   Nabumetone     REACTION: Muscle cramps    Current Outpatient Medications on File Prior to Visit  Medication Sig Dispense Refill   apixaban (ELIQUIS) 5 MG TABS tablet Take 1 tablet (5 mg total) by mouth 2 (two) times daily. 180 tablet 2   atorvastatin (LIPITOR) 20 MG tablet Take 1 tablet (20 mg total) by  mouth daily. 90 tablet 2   diltiazem (CARDIZEM CD) 180 MG 24 hr capsule Take 1 capsule (180 mg total) by mouth daily. 90 capsule 2   pantoprazole (PROTONIX) 40 MG tablet Pt take 1 tablet by mouth  daily for heartburn. 90 tablet 2   tamsulosin (FLOMAX) 0.4 MG CAPS capsule Take 0.4 mg by mouth daily as needed (Bladder problems).     triamcinolone ointment (KENALOG) 0.5 % Apply 1 Application topically 2 (two) times daily. 30 g 2   fluticasone (FLONASE) 50 MCG/ACT nasal spray PLACE 1 SPRAY INTO BOTH NOSTRILS 2 TIMES DAILY AS NEEDED FOR ALLERGIES OR RHINITIS. (Patient not taking: Reported on 11/23/2022) 48 mL 2   No current facility-administered medications on file prior to visit.    BP 132/86   Pulse (!) 105   Temp 97.9 F (36.6 C) (Temporal)   Ht 5' 10.5" (1.791 m)   Wt 225 lb (102.1 kg)   SpO2 98%   BMI 31.83 kg/m  Objective:   Physical Exam HENT:     Right Ear: Tympanic membrane and ear canal normal.     Left Ear: Tympanic membrane and ear canal normal.     Nose: Nose normal.     Right Sinus: No maxillary sinus tenderness or frontal sinus tenderness.     Left Sinus: No maxillary sinus tenderness or frontal sinus tenderness.  Eyes:     Conjunctiva/sclera: Conjunctivae normal.  Neck:     Thyroid: No thyromegaly.     Vascular: No carotid bruit.  Cardiovascular:     Rate and Rhythm: Normal rate and regular rhythm.     Heart sounds: Normal heart sounds.  Pulmonary:     Effort: Pulmonary effort is normal.     Breath sounds: Normal breath sounds. No wheezing or rales.  Abdominal:     General: Bowel sounds are normal.     Palpations: Abdomen is soft.     Tenderness: There is no abdominal tenderness.  Musculoskeletal:        General: Normal range of motion.     Cervical back: Neck supple.  Skin:    General: Skin is warm and dry.  Neurological:     Mental Status: He is alert and oriented to person, place, and time.     Cranial Nerves: No cranial nerve deficit.     Deep Tendon  Reflexes: Reflexes are normal and symmetric.  Psychiatric:        Mood and Affect: Mood normal.           Assessment & Plan:  Preventative health care Assessment & Plan: Prevnar 20 provided today. PSA UTD, follows with Urology.   Discussed the importance of a healthy diet and regular exercise in order for weight loss, and to reduce the risk of further co-morbidity.  Exam stable. Labs pending.  Follow up in 1 year for repeat physical.    Persistent atrial fibrillation [facility]) Assessment & Plan: Following with cardiology, cardiac monitor report reviewed from June 2024 which predominantly revealed persistent atrial fibrillation. Continue diltiazem 180 mg daily for rate control. Continue Eliquis 5 mg BID given high stroke risk.   Orders: -     CBC  Dilated aortic root (HCC) Assessment & Plan: Following with cardiology.  Repeat echocardiogram pending for later this year.    Gastroesophageal reflux disease, unspecified whether esophagitis present Assessment & Plan: Controlled.  Continue pantoprazole 40 mg daily.   Pure hypercholesterolemia Assessment & Plan: Repeat lipid panel pending.  Continue atorvastatin 20 mg daily.  Orders: -     Lipid panel -     Comprehensive metabolic panel  History of prostate cancer Assessment & Plan: Following with Dr. Annabell Howells with Urology. PSA is UTD.   Right upper lobe pulmonary  nodule Assessment & Plan: Incidental finding on CT cardiac scan from July 2023. Discussed with patient today.  He declines the recommendation follow up CT chest.    Prediabetes -     Hemoglobin A1c -     CBC  Essential hypertension Assessment & Plan: Controlled.  Continue diltiazem 180 mg daily.    Encounter for immunization -     Pneumococcal conjugate vaccine 20-valent        Doreene Nest, NP

## 2022-11-23 NOTE — Assessment & Plan Note (Signed)
Controlled. ° °Continue pantoprazole 40 mg daily. °

## 2022-12-16 ENCOUNTER — Other Ambulatory Visit: Payer: Self-pay | Admitting: Primary Care

## 2022-12-16 DIAGNOSIS — K219 Gastro-esophageal reflux disease without esophagitis: Secondary | ICD-10-CM

## 2022-12-17 ENCOUNTER — Other Ambulatory Visit: Payer: Self-pay | Admitting: Primary Care

## 2022-12-17 DIAGNOSIS — N289 Disorder of kidney and ureter, unspecified: Secondary | ICD-10-CM

## 2022-12-30 ENCOUNTER — Other Ambulatory Visit (INDEPENDENT_AMBULATORY_CARE_PROVIDER_SITE_OTHER): Payer: Medicare Other

## 2022-12-30 DIAGNOSIS — N289 Disorder of kidney and ureter, unspecified: Secondary | ICD-10-CM | POA: Diagnosis not present

## 2022-12-30 LAB — BASIC METABOLIC PANEL
BUN: 14 mg/dL (ref 6–23)
CO2: 28 mEq/L (ref 19–32)
Calcium: 8.8 mg/dL (ref 8.4–10.5)
Chloride: 104 mEq/L (ref 96–112)
Creatinine, Ser: 1.41 mg/dL (ref 0.40–1.50)
GFR: 46.64 mL/min — ABNORMAL LOW (ref >60.00)
Glucose, Bld: 98 mg/dL (ref 70–99)
Potassium: 3.8 mEq/L (ref 3.5–5.1)
Sodium: 139 mEq/L (ref 135–145)

## 2023-02-09 ENCOUNTER — Telehealth: Payer: Self-pay | Admitting: Primary Care

## 2023-02-09 NOTE — Telephone Encounter (Signed)
I recommend that he continue the Eliquis as he is at high risk for stroke/blood clot because of his persistent atrial fibrillation.  He could talk to his cardiologist about a potential dose reduction which may make him feel better.  I am not aware of any physical side effects from abrupt withdrawal of Eliquis.  (Will [facility] cardiologist as Lorain Childes)

## 2023-02-09 NOTE — Telephone Encounter (Signed)
Quintella Reichert, MD  to Doreene Nest, NP  [facility] Triage     02/09/23  5:26 PM  I suspect that his afib is making him feel bad and not the Eliquis.  The Eliquis really has no side effects except for increased risk of bleeding.  I would like him to get into afib clinic ASAP.  Please let his daughter know that he absolutely needs to stay on eliqis at 5mg  BID.  A lower dose will not be protective.  Please let patient know that it is the afib and not the eliquis that is likely making him feel bad  CV triage please call patient's daughter and let her know the above and please get him into afib clinic ASAP  I spoke with patient's daughter and gave her message from Dr Mayford Knife.  She will check with patient and call us back tomorrow regarding afib clinic appointment.

## 2023-02-09 NOTE — Telephone Encounter (Signed)
Called and spoke with patients daughter, advised her that patients cardiologist is the prescriber for Eliquis. She stated they were aware, the patient does not want to contact the cardiologist due to her being a family friend. Patients daughter stated he is going to stop taking the Eliquis either way they would just like to know from Jae Dire if she has any knowledge of alarming side effects they should look for. Pts daughter stated pt has appt with cardiologist in October, he doesn't want her to talk him out of stopping the med. He endorses feeling much better before he started taking the Eliquis. Please advise

## 2023-02-09 NOTE — Telephone Encounter (Signed)
Patient daughter Shawna Orleans called in and wanted to speak with someone regarding Chad Huang medication. She stated that he doesn't want to take the apixaban (ELIQUIS) 5 MG TABS tablet anymore and she had some questions. Please advise. Thank you!

## 2023-02-11 NOTE — Telephone Encounter (Signed)
Spoke with Dtr Shawna Orleans and patient. Shawna Orleans states she reviewed Dr. Norris Cross recommendation that patient stay on current dose of eliquis as lower dose would not offer enough protection, and patient verbalizes understanding and agrees to stay on current medical therapy. Patient refuses afib clinic at this time, states he will wait for appt w/ Dr. Mayford Knife on 03/10/23. Shawna Orleans states he thought he was feeling back from his afib or his medication but it turns out he has developed a cold. For this reason she would like to watch and wait for his appt in October, but agrees to call our office if patient's condition changes.

## 2023-03-10 ENCOUNTER — Ambulatory Visit: Payer: Medicare Other | Admitting: Cardiology

## 2023-04-25 DIAGNOSIS — R3915 Urgency of urination: Secondary | ICD-10-CM | POA: Diagnosis not present

## 2023-04-25 DIAGNOSIS — R8271 Bacteriuria: Secondary | ICD-10-CM | POA: Diagnosis not present

## 2023-05-09 NOTE — Progress Notes (Unsigned)
Established Patient Office Visit  Subjective   Patient ID: Chad Huang, male    DOB: 18-Jun-1940  Age: 82 y.o. MRN: [id number]  No chief complaint on file.   HPI  Chad Huang is a 82 year old male, patient of [name], NP, with past medical history of HTN, persistent Afib, GERD, HLD, Prediabetes, who presents today for an acute visit.     Patient Active Problem List   Diagnosis Date Noted   Right upper lobe pulmonary nodule 11/23/2022   Nose pain 07/27/2022   Dilated aortic root (HCC) 12/12/2021   Prediabetes 11/20/2021   Irregular heart beats 11/20/2021   Persistent atrial fibrillation (HCC) 11/20/2021   Chest discomfort 10/12/2018   Preventative health care 09/26/2017   History of prostate cancer 11/02/2010   Hyperlipidemia 10/25/2008   Essential hypertension 10/25/2008   Hearing loss 03/13/2007   Internal hemorrhoids 03/07/2007   GERD 03/07/2007   Past Medical History:  Diagnosis Date   Alcohol abuse    Anxiety and depression    charter, admission   Back pain    (Kritzer), microdiskectomy-great result   Benign prostatic hypertrophy    per Dr Annabell Howells with uro   Cataracts, bilateral    Cerumen impaction    left   Chest pain    Dilated aortic root (HCC)    40 mm by echo 11/2021   GERD (gastroesophageal reflux disease)    Hearing loss    Hiatal hernia    nodule   HTN (hypertension)    Hyperlipidemia, mixed    Inguinal hernia    left, hx of, repair   Internal hemorrhoids    Left ear pain    Muscle disorder    disorder, muscle/ligaminet nis, rt lower extremity   Prostate cancer (HCC) 05/17/2008   Dr Annabell Howells   Skin cancer    hx, left ear   Allergies  Allergen Reactions   Augmentin [Amoxicillin-Pot Clavulanate] Diarrhea   Clarithromycin     REACTION: unspecified   Codeine Phosphate     REACTION: nausea   Nabumetone     REACTION: Muscle cramps         10/18/2022   11:49 AM 10/16/2021   10:00 AM 10/15/2020    9:54 AM  Depression screen PHQ 2/9   Decreased Interest 0 0 0  Down, Depressed, Hopeless 0 0 0  PHQ - 2 Score 0 0 0  Altered sleeping  0 0  Tired, decreased energy  0 0  Change in appetite  0 0  Feeling bad or failure about yourself   0 0  Trouble concentrating  0 0  Moving slowly or fidgety/restless  0 0  Suicidal thoughts  0 0  PHQ-9 Score  0 0  Difficult doing work/chores  Not difficult at all Not difficult at all        No data to display            ROS    Objective:     There were no vitals taken for this visit. BP Readings from Last 3 Encounters:  11/23/22 132/86  09/17/22 120/78  07/27/22 112/70   Wt Readings from Last 3 Encounters:  11/23/22 225 lb (102.1 kg)  10/18/22 219 lb (99.3 kg)  09/17/22 225 lb 9.6 oz (102.3 kg)      Physical Exam   No results found for any visits on 05/10/23.     The ASCVD Risk score (Arnett DK, et al., 2019) failed to calculate for the  following reasons:   The 2019 ASCVD risk score is only valid for ages 34 to 39    Assessment & Plan:  There are no diagnoses linked to this encounter.   No follow-ups on file.    Modesto Charon, NP

## 2023-05-10 ENCOUNTER — Ambulatory Visit (INDEPENDENT_AMBULATORY_CARE_PROVIDER_SITE_OTHER): Payer: Medicare Other | Admitting: General Practice

## 2023-05-10 ENCOUNTER — Encounter: Payer: Self-pay | Admitting: General Practice

## 2023-05-10 VITALS — BP 130/82 | HR 90 | Temp 97.8°F | Ht 70.5 in | Wt 225.1 lb

## 2023-05-10 DIAGNOSIS — R051 Acute cough: Secondary | ICD-10-CM | POA: Insufficient documentation

## 2023-05-10 DIAGNOSIS — R0981 Nasal congestion: Secondary | ICD-10-CM | POA: Diagnosis not present

## 2023-05-10 DIAGNOSIS — J029 Acute pharyngitis, unspecified: Secondary | ICD-10-CM

## 2023-05-10 LAB — POCT INFLUENZA A/B
Influenza A, POC: NEGATIVE
Influenza B, POC: NEGATIVE

## 2023-05-10 LAB — POCT RAPID STREP A (OFFICE): Rapid Strep A Screen: NEGATIVE

## 2023-05-10 LAB — POC COVID19 BINAXNOW: SARS Coronavirus 2 Ag: NEGATIVE

## 2023-05-10 MED ORDER — FLUTICASONE PROPIONATE 50 MCG/ACT NA SUSP: NASAL | 0 refills | Status: DC

## 2023-05-10 NOTE — Assessment & Plan Note (Addendum)
Covid, flu a & b - all negative today.  Advised patient to discontinue Afrin as it can cause rebound symptoms. Patient and daughter verbalize understanding.   Refill sent for Flonase.

## 2023-05-10 NOTE — Assessment & Plan Note (Addendum)
Covid, flu A & B- all negative in office today.  Symptoms suggestive of Viral URI as he is improving. Physical exam stable.   Discussed the cycle of viral uri with patient and daughter. They verbalize understanding.   Recommendations given for OTC medications, use salt-water gargles, honey, staying hydrated cool-mist humidifier.   His daughter will update if his symptoms worsen or fail to improve.

## 2023-05-10 NOTE — Patient Instructions (Signed)
You can try a few things over the counter to help with your symptoms including:  Cough: Delsym or Robitussin (get the off brand, works just as well) Chest Congestion: Mucinex (plain) Nasal Congestion/Ear Pressure/Sinus Pressure: Try using Flonase (fluticasone) nasal spray. Instill 1 spray in each nostril twice daily. This can be purchased over the counter. Body aches, fevers, headache: Ibuprofen (not to exceed 2400 mg in 24 hours) or Acetaminophen-Tylenol (not to exceed 3000 mg in 24 hours) Runny Nose/Throat Drainage/Sneezing/Itchy or Watery Eyes: An antihistamine such as Zyrtec, Claritin, Xyzal, Allegra  You should be feeling better by day seven of symptoms, but please do contact me if this is not the case.  Rest, stay hydrated, salt-water gargles, honey and cool mist humidifier.   It was a pleasure meeting you!

## 2023-05-13 ENCOUNTER — Telehealth: Payer: Self-pay

## 2023-05-13 DIAGNOSIS — J0100 Acute maxillary sinusitis, unspecified: Secondary | ICD-10-CM

## 2023-05-13 MED ORDER — DOXYCYCLINE HYCLATE 100 MG PO TABS: 100.0000 mg | ORAL_TABLET | Freq: Two times a day (BID) | ORAL | 0 refills | Status: DC

## 2023-05-13 MED ORDER — DOXYCYCLINE HYCLATE 100 MG PO TABS
100.0000 mg | ORAL_TABLET | Freq: Two times a day (BID) | ORAL | 0 refills | Status: AC
Start: 2023-05-13 — End: 2023-05-20

## 2023-05-13 NOTE — Telephone Encounter (Signed)
Doxycycline 100 mg BID x 7 days sent to the pharmacy.  Called daughter and updated her that it is only for 7 days and not ten. If heis not feeling better, then to follow up with PCP.

## 2023-05-13 NOTE — Addendum Note (Signed)
Addended by: Modesto Charon on: 05/13/2023 01:56 PM   Modules accepted: Orders

## 2023-05-13 NOTE — Telephone Encounter (Signed)
Called and spoke with patients daughter she states patient is still having cough, nose and facial/sinus pain. Denies SOB, wheezing, congestion. States the nose pain has increased in severity due to the current flare of symptoms.

## 2023-05-13 NOTE — Telephone Encounter (Signed)
Called and advised patients daughter. Please send to [facility]

## 2023-05-13 NOTE — Telephone Encounter (Signed)
Copied from CRM [id number]. Topic: Clinical - Medical Advice >> May 13, 2023  9:10 AM Theodis Sato wrote: Reason for CRM: PT is still having unbearable pain in his nose and still has sinus infection. PT's daughter states Chad Huang did not prescribe him any medicine during their last appointment. Please sent medication to [id number] [place] - [address] [place] [address] Phone: [phone] Fax: [phone]

## 2023-06-27 DIAGNOSIS — H52223 Regular astigmatism, bilateral: Secondary | ICD-10-CM | POA: Diagnosis not present

## 2023-06-27 DIAGNOSIS — Z9842 Cataract extraction status, left eye: Secondary | ICD-10-CM | POA: Diagnosis not present

## 2023-06-27 DIAGNOSIS — Z9841 Cataract extraction status, right eye: Secondary | ICD-10-CM | POA: Diagnosis not present

## 2023-06-28 ENCOUNTER — Other Ambulatory Visit: Payer: Self-pay | Admitting: Primary Care

## 2023-06-28 DIAGNOSIS — K219 Gastro-esophageal reflux disease without esophagitis: Secondary | ICD-10-CM

## 2023-06-29 ENCOUNTER — Other Ambulatory Visit: Payer: Self-pay | Admitting: Nurse Practitioner

## 2023-06-29 DIAGNOSIS — K219 Gastro-esophageal reflux disease without esophagitis: Secondary | ICD-10-CM

## 2023-06-29 MED ORDER — DILTIAZEM HCL ER COATED BEADS 180 MG PO CP24
180.0000 mg | ORAL_CAPSULE | Freq: Every day | ORAL | 1 refills | Status: AC
Start: 2023-06-29 — End: ?

## 2023-06-29 MED ORDER — ATORVASTATIN CALCIUM 20 MG PO TABS: 20.0000 mg | ORAL_TABLET | Freq: Every day | ORAL | 1 refills | Status: DC

## 2023-06-29 MED ORDER — PANTOPRAZOLE SODIUM 40 MG PO TBEC: DELAYED_RELEASE_TABLET | ORAL | 1 refills | Status: DC

## 2023-08-07 ENCOUNTER — Other Ambulatory Visit: Payer: Self-pay | Admitting: General Practice

## 2023-08-07 DIAGNOSIS — R0981 Nasal congestion: Secondary | ICD-10-CM

## 2023-08-22 ENCOUNTER — Other Ambulatory Visit: Payer: Self-pay | Admitting: Primary Care

## 2023-08-22 DIAGNOSIS — K219 Gastro-esophageal reflux disease without esophagitis: Secondary | ICD-10-CM

## 2023-08-22 NOTE — Telephone Encounter (Signed)
 Last script given by Quintella Reichert, MD on 07/08/23 #90 1

## 2023-10-19 ENCOUNTER — Ambulatory Visit (INDEPENDENT_AMBULATORY_CARE_PROVIDER_SITE_OTHER): Payer: Medicare Other

## 2023-10-19 VITALS — Ht 70.5 in | Wt 217.0 lb

## 2023-10-19 DIAGNOSIS — Z0000 Encounter for general adult medical examination without abnormal findings: Secondary | ICD-10-CM | POA: Diagnosis not present

## 2023-10-19 NOTE — Progress Notes (Signed)
 Subjective:   Chad Huang is a 83 y.o. who presents for a Medicare Wellness preventive visit.  As a reminder, Annual Wellness Visits don't include a physical exam, and some assessments may be limited, especially if this visit is performed virtually. We may recommend an in-person follow-up visit with your provider if needed.  Visit Complete: Virtual I connected with  Chad Huang on 10/19/23 by a audio enabled telemedicine application and verified that I am speaking with the correct person using two identifiers.  Patient Location: Home  Provider Location: Office/Clinic  I discussed the limitations of evaluation and management by telemedicine. The patient expressed understanding and agreed to proceed.  Vital Signs: Because this visit was a virtual/telehealth visit, some criteria may be missing or patient reported. Any vitals not documented were not able to be obtained and vitals that have been documented are patient reported.  VideoDeclined- This patient declined Librarian, academic. Therefore the visit was completed with audio only.  Persons Participating in Visit: Patient.  AWV Questionnaire: No: Patient Medicare AWV questionnaire was not completed prior to this visit.  Cardiac Risk Factors include: advanced age (>67men, >59 women);dyslipidemia;hypertension;male gender;obesity (BMI >30kg/m2)     Objective:     Today's Vitals   10/19/23 1305  Weight: 217 lb (98.4 kg)  Height: 5' 10.5" (1.791 m)   Body mass index is 30.7 kg/m.     10/19/2023    1:21 PM 10/18/2022   11:50 AM 01/08/2022    7:26 AM 10/16/2021   10:03 AM 10/15/2020    9:52 AM 05/21/2020   11:09 AM 10/12/2019   10:39 AM  Advanced Directives  Does Patient Have a Medical Advance Directive? Yes Yes Yes Yes Yes No Yes  Type of Estate agent of [place];Living will Healthcare Power of [place];Living will Living will Healthcare Power of [place];Living will Healthcare Power  of [place];Living will  Healthcare Power of [place];Living will  Does patient want to make changes to medical advance directive?  No - Patient declined  Yes (Inpatient - patient defers changing a medical advance directive and declines information at this time)     Copy of Healthcare Power of Attorney in Chart? Yes - validated most recent copy scanned in chart (See row information) Yes - validated most recent copy scanned in chart (See row information)  No - copy requested Yes - validated most recent copy scanned in chart (See row information)  Yes - validated most recent copy scanned in chart (See row information)    Current Medications (verified) Outpatient Encounter Medications as of 10/19/2023  Medication Sig   apixaban  (ELIQUIS ) 5 MG TABS tablet Take 1 tablet (5 mg total) by mouth 2 (two) times daily.   atorvastatin  (LIPITOR) 20 MG tablet Take 1 tablet (20 mg total) by mouth daily.   diltiazem  (CARDIZEM  CD) 180 MG 24 hr capsule Take 1 capsule (180 mg total) by mouth daily.   fluticasone  (FLONASE ) 50 MCG/ACT nasal spray PLACE 1 SPRAY INTO BOTH NOSTRILS 2 TIMES DAILY AS NEEDED FOR ALLERGIES OR RHINITIS.   pantoprazole  (PROTONIX ) 40 MG tablet TAKE 1 TABLET BY MOUTH DAILY FOR HEARTBURN   tamsulosin (FLOMAX) 0.4 MG CAPS capsule Take 0.4 mg by mouth daily as needed (Bladder problems).   triamcinolone  ointment (KENALOG ) 0.5 % Apply 1 Application topically 2 (two) times daily.   No facility-administered encounter medications on file as of 10/19/2023.    Allergies (verified) Augmentin  [amoxicillin -pot clavulanate], Clarithromycin, Codeine phosphate, and Nabumetone   History:  Past Medical History:  Diagnosis Date   Alcohol abuse    Anxiety and depression    charter, admission   Back pain    (Kritzer), microdiskectomy-great result   Benign prostatic hypertrophy    per Dr Inga Manges with uro   Cataracts, bilateral    Cerumen impaction    left   Chest pain    Dilated aortic root (HCC)    40 mm  by echo 11/2021   GERD (gastroesophageal reflux disease)    Hearing loss    Hiatal hernia    nodule   HTN (hypertension)    Hyperlipidemia, mixed    Inguinal hernia    left, hx of, repair   Internal hemorrhoids    Left ear pain    Muscle disorder    disorder, muscle/ligaminet nis, rt lower extremity   Prostate cancer (HCC) 05/17/2008   Dr Inga Manges   Skin cancer    hx, left ear   Past Surgical History:  Procedure Laterality Date   back microdiskectomy  10/01   [place]   BACK SURGERY     CARDIOVASCULAR STRESS TEST  2009   [place]   CARDIOVERSION N/A 01/08/2022   Procedure: CARDIOVERSION;  Surgeon: Elmyra Haggard, MD;  Location: [facility] ENDOSCOPY;  Service: Cardiovascular;  Laterality: N/A;   COLONOSCOPY  5/05   polyps int hemorrhoids, repeat 10 yrs   ESOPHAGOGASTRODUODENOSCOPY  03/02/00   gastritis, chronic   ESOPHAGOGASTRODUODENOSCOPY  5/05   polyps in antrum, neg H Pylori   ETT  05/19/07   Adenosine Muoview   H Pylori positive  8/98   HERNIA REPAIR     LAPAROSCOPIC INGUINAL HERNIA REPAIR  10/96   levitin eval  02/05/99   hip pain   LUMBAR SPINE SURGERY  5/00   MRI  10/07/98   degen changes 9Dr. Marland Silvas) steroid injections   MYELOGRAM  9/01   stenosis L4-5   Family History  Problem Relation Age of Onset   Prostate cancer Father    Stroke Other        grandmother   Lung cancer Sister    Heart disease Brother    Social History   Socioeconomic History   Marital status: Widowed    Spouse name: Not on file   Number of children: Not on file   Years of education: Not on file   Highest education level: Not on file  Occupational History   Not on file  Tobacco Use   Smoking status: Former    Current packs/day: 1.00    Average packs/day: 1 pack/day for 20.0 years (20.0 ttl pk-yrs)    Types: Cigarettes   Smokeless tobacco: Current    Types: Chew   Tobacco comments:    quit in 1973  Vaping Use   Vaping status: Never Used  Substance and Sexual Activity   Alcohol use: No     Comment: none since 1996   Drug use: No   Sexual activity: Not Currently  Other Topics Concern   Not on file  Social History Narrative   Married,    One daughter.    1 granddaughter.   Retired. Once worked as a Psychologist, occupational.   Active.      [phone] (c?)   Social Drivers of Health   Financial Resource Strain: Low Risk  (10/19/2023)   Overall Financial Resource Strain (CARDIA)    Difficulty of Paying Living Expenses: Not hard at all  Food Insecurity: No Food Insecurity (10/19/2023)   Hunger Vital Sign  Worried About Programme researcher, broadcasting/film/video in the Last Year: Never true    Ran Out of Food in the Last Year: Never true  Transportation Needs: No Transportation Needs (10/19/2023)   PRAPARE - Administrator, Civil Service (Medical): No    Lack of Transportation (Non-Medical): No  Physical Activity: Insufficiently Active (10/19/2023)   Exercise Vital Sign    Days of Exercise per Week: 3 days    Minutes of Exercise per Session: 30 min  Stress: No Stress Concern Present (10/19/2023)   [employer] of Occupational Health - Occupational Stress Questionnaire    Feeling of Stress : Not at all  Social Connections: Moderately Isolated (10/19/2023)   Social Connection and Isolation Panel [NHANES]    Frequency of Communication with Friends and Family: More than three times a week    Frequency of Social Gatherings with Friends and Family: More than three times a week    Attends Religious Services: More than 4 times per year    Active Member of [employer] or Organizations: No    Attends Banker Meetings: Never    Marital Status: Widowed    Tobacco Counseling Ready to quit: Not Answered Counseling given: Not Answered Tobacco comments: quit in 1973    Clinical Intake:  Pre-visit preparation completed: Yes  Pain : No/denies pain     BMI - recorded: 30.7 Nutritional Status: BMI > 30  Obese Nutritional Risks: None Diabetes: No  Lab Results  Component Value Date    HGBA1C 5.9 11/23/2022   HGBA1C 5.9 11/20/2021   HGBA1C 5.9 11/19/2020     How often do you need to have someone help you when you read instructions, pamphlets, or other written materials from your doctor or pharmacy?: 1 - Never  Interpreter Needed?: No  Comments: lives alone Information entered by :: B.Raetta Agostinelli,LPN   Activities of Daily Living     10/19/2023    1:22 PM  In your present state of health, do you have any difficulty performing the following activities:  Hearing? 1  Vision? 0  Difficulty concentrating or making decisions? 0  Walking or climbing stairs? 0  Dressing or bathing? 0  Doing errands, shopping? 0  Preparing Food and eating ? N  Using the Toilet? N  In the past six months, have you accidently leaked urine? N  Do you have problems with loss of bowel control? N  Managing your Medications? N  Managing your Finances? N  Housekeeping or managing your Housekeeping? N    Patient Care Team: Gabriel John, NP as PCP - General (Internal Medicine) Jacqueline Matsu, MD as PCP - Cardiology (Cardiology) Arminda Berth, OD as Consulting Physician (Optometry) Asencion Blacksmith, MD (Inactive) as Consulting Physician (Gastroenterology) Prescott Brodie, MD (Inactive) as Consulting Physician (Otolaryngology) Homero Luster, MD as Attending Physician (Urology) Tivis Forster, MD as Referring Physician (Neurosurgery) Arminda Berth, OD (Optometry)  I have updated your Care Teams any recent Medical Services you may have received from other providers in the past year.     Assessment:    This is a routine wellness examination for Zalan.  Hearing/Vision screen Hearing Screening - Comments:: Pt says he has hearing amplifiers;hears ok Vision Screening - Comments:: Pt says his vision is 20/20 Dr Particia Bolus   Goals Addressed             This Visit's Progress    COMPLETED: DIET - EAT MORE FRUITS AND VEGETABLES   On track    Patient  Stated   On track     10/19/23-I will continue to take medications as prescribed.      Patient Stated   On track    10/19/23, I will continue to walk daily for 30 minutes        Depression Screen     10/19/2023    1:17 PM 10/18/2022   11:49 AM 10/16/2021   10:00 AM 10/15/2020    9:54 AM 10/12/2019   10:42 AM 10/11/2018    1:40 PM 09/26/2017   10:50 AM  PHQ 2/9 Scores  PHQ - 2 Score 0 0 0 0 0 0 0  PHQ- 9 Score   0 0 0 0 0    Fall Risk     10/19/2023    1:10 PM 11/23/2022    7:36 AM 10/18/2022   11:44 AM 10/16/2021   10:06 AM 10/15/2020    9:53 AM  Fall Risk   Falls in the past year? 0 1 1 0 0  Comment   tripped on area rug    Number falls in past yr: 0 0 0 0 0  Injury with Fall? 0 0 0 0 0  Risk for fall due to : No Fall Risks No Fall Risks No Fall Risks No Fall Risks Medication side effect  Follow up Education provided;Falls prevention discussed Falls evaluation completed Falls evaluation completed;Falls prevention discussed;Education provided Falls evaluation completed Falls evaluation completed;Falls prevention discussed    MEDICARE RISK AT HOME:  Medicare Risk at Home Any stairs in or around the home?: Yes If so, are there any without handrails?: Yes Home free of loose throw rugs in walkways, pet beds, electrical cords, etc?: Yes Adequate lighting in your home to reduce risk of falls?: Yes Life alert?: No Use of a cane, walker or w/c?: No Grab bars in the bathroom?: Yes Shower chair or bench in shower?: No Elevated toilet seat or a handicapped toilet?: Yes  TIMED UP AND GO:  Was the test performed?  No  Cognitive Function: 6CIT completed    10/15/2020   10:04 AM 10/12/2019   10:43 AM 10/11/2018    1:52 PM 09/26/2017   10:57 AM 09/22/2016   10:45 AM  MMSE - Mini Mental State Exam  Not completed: Refused      Orientation to time  5 5 5 5   Orientation to Place  5 5 5 5   Registration  3 3 3 3   Attention/ Calculation  5 0 0 5  Recall  3 3 3 3   Language- name 2 objects   0 0 0  Language- repeat  1 1 1 1    Language- follow 3 step command   0 3 3  Language- read & follow direction   0 0 0  Write a sentence   0 0 0  Copy design   0 0 0  Total score   17 20 25         10/19/2023    1:23 PM 10/18/2022   11:51 AM  6CIT Screen  What Year? 0 points 0 points  What month? 0 points 0 points  What time? 0 points 0 points  Count back from 20 0 points 4 points  Months in reverse 0 points 0 points  Repeat phrase 0 points 0 points  Total Score 0 points 4 points    Immunizations Immunization History  Administered Date(s) Administered   Influenza-Unspecified 05/31/2016   PNEUMOCOCCAL CONJUGATE-20 11/23/2022   Pneumococcal Conjugate-13 09/22/2016   Pneumococcal Polysaccharide-23  05/17/2008   Td 06/17/1993, 09/18/2008    Screening Tests Health Maintenance  Topic Date Due   DTaP/Tdap/Td (3 - Tdap) 09/19/2018   COVID-19 Vaccine (1) 05/25/2025 (Originally 03/07/1946)   INFLUENZA VACCINE  12/16/2023   Medicare Annual Wellness (AWV)  10/18/2024   Pneumonia Vaccine 23+ Years old  Completed   HPV VACCINES  Aged Out   Meningococcal B Vaccine  Aged Out   Zoster Vaccines- Shingrix  Discontinued    Health Maintenance  Health Maintenance Due  Topic Date Due   DTaP/Tdap/Td (3 - Tdap) 09/19/2018   Health Maintenance Items Addressed: None needed at this time  Additional Screening:  Vision Screening: Recommended annual ophthalmology exams for early detection of glaucoma and other disorders of the eye. Would you like a referral to an eye doctor? No    Dental Screening: Recommended annual dental exams for proper oral hygiene  Community Resource Referral / Chronic Care Management: CRR required this visit?  No   CCM required this visit?  No   Plan:    I have personally reviewed and noted the following in the patient's chart:   Medical and social history Use of alcohol, tobacco or illicit drugs  Current medications and supplements including opioid prescriptions. Patient is not currently  taking opioid prescriptions. Functional ability and status Nutritional status Physical activity Advanced directives List of other physicians Hospitalizations, surgeries, and ER visits in previous 12 months Vitals Screenings to include cognitive, depression, and falls Referrals and appointments  In addition, I have reviewed and discussed with patient certain preventive protocols, quality metrics, and best practice recommendations. A written personalized care plan for preventive services as well as general preventive health recommendations were provided to patient.   Nerissa Bannister, LPN   05/22/1094   After Visit Summary: (MyChart) Due to this being a telephonic visit, the after visit summary with patients personalized plan was offered to patient via MyChart   Notes: Nothing significant to report at this time.

## 2023-10-19 NOTE — Patient Instructions (Signed)
 [name] , Thank you for taking time out of your busy schedule to complete your Annual Wellness Visit with me. I enjoyed our conversation and look forward to speaking with you again next year. I, as well as your care team,  appreciate your ongoing commitment to your health goals. Please review the following plan we discussed and let me know if I can assist you in the future. Your Game plan/ To Do List     Follow up Visits: Next Medicare AWV with our clinical staff: 10/19/24 @ 1pm televisit   Have you seen your provider in the last 6 months (3 months if uncontrolled diabetes)? No Next Office Visit with your provider: 11/25/23 Physical  Clinician Recommendations:  Aim for 30 minutes of exercise or brisk walking, 6-8 glasses of water, and 5 servings of fruits and vegetables each day.       This is a list of the screening recommended for you and due dates:  Health Maintenance  Topic Date Due   DTaP/Tdap/Td vaccine (3 - Tdap) 09/19/2018   COVID-19 Vaccine (1) 05/25/2025*   Flu Shot  12/16/2023   Medicare Annual Wellness Visit  10/18/2024   Pneumonia Vaccine  Completed   HPV Vaccine  Aged Out   Meningitis B Vaccine  Aged Out   Zoster (Shingles) Vaccine  Discontinued  *Topic was postponed. The date shown is not the original due date.    Advanced directives: (In Chart) A copy of your advanced directives are scanned into your chart should your provider ever need it. Advance Care Planning is important because it:  [x]  Makes sure you receive the medical care that is consistent with your values, goals, and preferences  [x]  It provides guidance to your family and loved ones and reduces their decisional burden about whether or not they are making the right decisions based on your wishes.  Follow the link provided in your after visit summary or read over the paperwork we have mailed to you to help you started getting your Advance Directives in place. If you need assistance in completing these, please  reach out to us  so that we can help you!

## 2023-10-24 ENCOUNTER — Other Ambulatory Visit: Payer: Self-pay | Admitting: Primary Care

## 2023-10-24 DIAGNOSIS — K219 Gastro-esophageal reflux disease without esophagitis: Secondary | ICD-10-CM

## 2023-10-29 ENCOUNTER — Other Ambulatory Visit: Payer: Self-pay | Admitting: Primary Care

## 2023-10-29 DIAGNOSIS — R0981 Nasal congestion: Secondary | ICD-10-CM

## 2023-11-25 ENCOUNTER — Encounter: Payer: Self-pay | Admitting: Primary Care

## 2023-11-25 ENCOUNTER — Ambulatory Visit: Payer: Self-pay | Admitting: Primary Care

## 2023-11-25 ENCOUNTER — Ambulatory Visit (INDEPENDENT_AMBULATORY_CARE_PROVIDER_SITE_OTHER): Payer: Medicare Other | Admitting: Primary Care

## 2023-11-25 VITALS — BP 122/68 | HR 107 | Temp 97.2°F | Ht 70.5 in | Wt 221.0 lb

## 2023-11-25 DIAGNOSIS — K219 Gastro-esophageal reflux disease without esophagitis: Secondary | ICD-10-CM

## 2023-11-25 DIAGNOSIS — I4819 Other persistent atrial fibrillation: Secondary | ICD-10-CM | POA: Diagnosis not present

## 2023-11-25 DIAGNOSIS — I77810 Thoracic aortic ectasia: Secondary | ICD-10-CM

## 2023-11-25 DIAGNOSIS — I10 Essential (primary) hypertension: Secondary | ICD-10-CM | POA: Diagnosis not present

## 2023-11-25 DIAGNOSIS — Z0000 Encounter for general adult medical examination without abnormal findings: Secondary | ICD-10-CM

## 2023-11-25 DIAGNOSIS — E7800 Pure hypercholesterolemia, unspecified: Secondary | ICD-10-CM

## 2023-11-25 DIAGNOSIS — R7303 Prediabetes: Secondary | ICD-10-CM

## 2023-11-25 DIAGNOSIS — Z8546 Personal history of malignant neoplasm of prostate: Secondary | ICD-10-CM

## 2023-11-25 DIAGNOSIS — J3489 Other specified disorders of nose and nasal sinuses: Secondary | ICD-10-CM | POA: Diagnosis not present

## 2023-11-25 LAB — LIPID PANEL
Cholesterol: 148 mg/dL (ref 0–200)
HDL: 31.7 mg/dL — ABNORMAL LOW (ref >39.00)
LDL Cholesterol: 91 mg/dL (ref 0–99)
NonHDL: 116.38
Total CHOL/HDL Ratio: 5
Triglycerides: 129 mg/dL (ref 0.0–149.0)
VLDL: 25.8 mg/dL (ref 0.0–40.0)

## 2023-11-25 LAB — COMPREHENSIVE METABOLIC PANEL WITH GFR
ALT: 10 U/L (ref 0–53)
AST: 12 U/L (ref 0–37)
Albumin: 4 g/dL (ref 3.5–5.2)
Alkaline Phosphatase: 80 U/L (ref 39–117)
BUN: 13 mg/dL (ref 6–23)
CO2: 26 meq/L (ref 19–32)
Calcium: 8.8 mg/dL (ref 8.4–10.5)
Chloride: 107 meq/L (ref 96–112)
Creatinine, Ser: 1.34 mg/dL (ref 0.40–1.50)
GFR: 49.26 mL/min — ABNORMAL LOW (ref >60.00)
Glucose, Bld: 100 mg/dL — ABNORMAL HIGH (ref 70–99)
Potassium: 3.9 meq/L (ref 3.5–5.1)
Sodium: 140 meq/L (ref 135–145)
Total Bilirubin: 0.7 mg/dL (ref 0.2–1.2)
Total Protein: 5.8 g/dL — ABNORMAL LOW (ref 6.0–8.3)

## 2023-11-25 LAB — HEMOGLOBIN A1C: Hgb A1c MFr Bld: 6 % (ref 4.6–6.5)

## 2023-11-25 MED ORDER — MUPIROCIN 2 % EX OINT: 1.0000 | TOPICAL_OINTMENT | Freq: Two times a day (BID) | CUTANEOUS | 0 refills | Status: DC | PRN

## 2023-11-25 NOTE — Assessment & Plan Note (Signed)
 Following with urology, PSA up-to-date per patient.

## 2023-11-25 NOTE — Patient Instructions (Signed)
 Stop by the lab prior to leaving today. I will notify you of your results once received.   It was a pleasure to see you today!

## 2023-11-25 NOTE — Assessment & Plan Note (Signed)
 Controlled.  Continue pantoprazole 40 mg daily.

## 2023-11-25 NOTE — Assessment & Plan Note (Signed)
Controlled.  Continue diltiazem 180 mg daily.

## 2023-11-25 NOTE — Progress Notes (Signed)
 Subjective:    Patient ID: Chad Huang, male    DOB: February 24, 1941, 83 y.o.   MRN: [id number]  HPI  Chad Huang is a very pleasant 83 y.o. male who presents today for complete physical and follow up of chronic conditions.  He is needing a refill of his nasal ointment.   Immunizations: -Tetanus: Completed in 2010 -Shingles: Never completed  -Pneumonia: Completed Prevnar 20 in 2024, Prevnar 13 in 2018, Pneumovax 23 in 2010  Diet: Fair diet.  Exercise: No regular exercise.  Eye exam: Completes annually  Dental exam: Completed years ago   Colonoscopy: Completed in 2005, no additional screening needed given age.  PSA: UTD, follows with Urology   BP Readings from Last 3 Encounters:  11/25/23 122/68  05/10/23 130/82  11/23/22 132/86           Review of Systems  Constitutional:  Negative for unexpected weight change.  HENT:  Positive for hearing loss. Negative for rhinorrhea.   Respiratory:  Negative for cough and shortness of breath.   Cardiovascular:  Negative for chest pain.  Gastrointestinal:  Negative for constipation and diarrhea.  Genitourinary:  Negative for difficulty urinating.  Musculoskeletal:  Negative for arthralgias and myalgias.  Skin:  Negative for rash.  Allergic/Immunologic: Negative for environmental allergies.  Neurological:  Negative for dizziness and headaches.  Psychiatric/Behavioral:  The patient is not nervous/anxious.          Past Medical History:  Diagnosis Date   Alcohol abuse    Anxiety and depression    charter, admission   Back pain    ([place]), microdiskectomy-great result   Benign prostatic hypertrophy    per Dr Watt with uro   Cataracts, bilateral    Cerumen impaction    left   Chest pain    Dilated aortic root (HCC)    40 mm by echo 11/2021   GERD (gastroesophageal reflux disease)    Hearing loss    Hiatal hernia    nodule   HTN (hypertension)    Hyperlipidemia, mixed    Inguinal hernia    left, hx of, repair    Internal hemorrhoids    Irregular heart beats 11/20/2021   Left ear pain    Muscle disorder    disorder, muscle/ligaminet nis, rt lower extremity   Prostate cancer (HCC) 05/17/2008   Dr Watt   Skin cancer    hx, left ear    Social History   Socioeconomic History   Marital status: Widowed    Spouse name: Not on file   Number of children: Not on file   Years of education: Not on file   Highest education level: Not on file  Occupational History   Not on file  Tobacco Use   Smoking status: Former    Current packs/day: 1.00    Average packs/day: 1 pack/day for 20.0 years (20.0 ttl pk-yrs)    Types: Cigarettes   Smokeless tobacco: Current    Types: Chew   Tobacco comments:    quit in 1973  Vaping Use   Vaping status: Never Used  Substance and Sexual Activity   Alcohol use: No    Comment: none since 1996   Drug use: No   Sexual activity: Not Currently  Other Topics Concern   Not on file  Social History Narrative   Married,    One daughter.    1 granddaughter.   Retired. Once worked as a Psychologist, occupational.   Active.  [phone] (c?)   Social Drivers of Health   Financial Resource Strain: Low Risk  (10/19/2023)   Overall Financial Resource Strain (CARDIA)    Difficulty of Paying Living Expenses: Not hard at all  Food Insecurity: No Food Insecurity (10/19/2023)   Hunger Vital Sign    Worried About Running Out of Food in the Last Year: Never true    Ran Out of Food in the Last Year: Never true  Transportation Needs: No Transportation Needs (10/19/2023)   PRAPARE - Administrator, Civil Service (Medical): No    Lack of Transportation (Non-Medical): No  Physical Activity: Insufficiently Active (10/19/2023)   Exercise Vital Sign    Days of Exercise per Week: 3 days    Minutes of Exercise per Session: 30 min  Stress: No Stress Concern Present (10/19/2023)   [employer] of Occupational Health - Occupational Stress Questionnaire    Feeling of Stress : Not at  all  Social Connections: Moderately Isolated (10/19/2023)   Social Connection and Isolation Panel    Frequency of Communication with Friends and Family: More than three times a week    Frequency of Social Gatherings with Friends and Family: More than three times a week    Attends Religious Services: More than 4 times per year    Active Member of [employer] or Organizations: No    Attends Banker Meetings: Never    Marital Status: Widowed  Intimate Partner Violence: Not At Risk (10/19/2023)   Humiliation, Afraid, Rape, and Kick questionnaire    Fear of Current or Ex-Partner: No    Emotionally Abused: No    Physically Abused: No    Sexually Abused: No    Past Surgical History:  Procedure Laterality Date   back microdiskectomy  10/01   [place]   BACK SURGERY     CARDIOVASCULAR STRESS TEST  2009   [place]   CARDIOVERSION N/A 01/08/2022   Procedure: CARDIOVERSION;  Surgeon: Okey Vina GAILS, MD;  Location: [facility] ENDOSCOPY;  Service: Cardiovascular;  Laterality: N/A;   COLONOSCOPY  5/05   polyps int hemorrhoids, repeat 10 yrs   ESOPHAGOGASTRODUODENOSCOPY  03/02/00   gastritis, chronic   ESOPHAGOGASTRODUODENOSCOPY  5/05   polyps in antrum, neg H Pylori   ETT  05/19/07   Adenosine Muoview   H Pylori positive  8/98   HERNIA REPAIR     LAPAROSCOPIC INGUINAL HERNIA REPAIR  10/96   levitin eval  02/05/99   hip pain   LUMBAR SPINE SURGERY  5/00   MRI  10/07/98   degen changes 9Dr. Shari) steroid injections   MYELOGRAM  9/01   stenosis L4-5    Family History  Problem Relation Age of Onset   Prostate cancer Father    Stroke Other        grandmother   Lung cancer Sister    Heart disease Brother     Allergies  Allergen Reactions   Augmentin  [Amoxicillin -Pot Clavulanate] Diarrhea   Clarithromycin     REACTION: unspecified   Codeine Phosphate     REACTION: nausea   Nabumetone     REACTION: Muscle cramps    Current Outpatient Medications on File Prior to Visit  Medication  Sig Dispense Refill   apixaban  (ELIQUIS ) 5 MG TABS tablet Take 1 tablet (5 mg total) by mouth 2 (two) times daily. 180 tablet 2   atorvastatin  (LIPITOR) 20 MG tablet Take 1 tablet (20 mg total) by mouth daily. 90 tablet 1   diltiazem  (CARDIZEM   CD) 180 MG 24 hr capsule Take 1 capsule (180 mg total) by mouth daily. 90 capsule 1   fluticasone  (FLONASE ) 50 MCG/ACT nasal spray PLACE 1 SPRAY INTO BOTH NOSTRILS 2 TIMES DAILY AS NEEDED FOR ALLERGIES OR RHINITIS. 48 mL 0   pantoprazole  (PROTONIX ) 40 MG tablet TAKE 1 TABLET BY MOUTH DAILY FOR HEARTBURN 90 tablet 0   tamsulosin (FLOMAX) 0.4 MG CAPS capsule Take 0.4 mg by mouth daily as needed (Bladder problems).     No current facility-administered medications on file prior to visit.    BP 122/68   Pulse (!) 107   Temp (!) 97.2 F (36.2 C) (Temporal)   Ht 5' 10.5 (1.791 m)   Wt 221 lb (100.2 kg)   SpO2 95%   BMI 31.26 kg/m  Objective:   Physical Exam HENT:     Right Ear: Tympanic membrane and ear canal normal.     Left Ear: Tympanic membrane and ear canal normal.  Eyes:     Pupils: Pupils are equal, round, and reactive to light.  Cardiovascular:     Rate and Rhythm: Normal rate. Rhythm irregular.  Pulmonary:     Effort: Pulmonary effort is normal.     Breath sounds: Normal breath sounds.  Abdominal:     General: Bowel sounds are normal.     Palpations: Abdomen is soft.     Tenderness: There is no abdominal tenderness.  Musculoskeletal:        General: Normal range of motion.     Cervical back: Neck supple.  Skin:    General: Skin is warm and dry.  Neurological:     Mental Status: He is alert and oriented to person, place, and time.     Cranial Nerves: No cranial nerve deficit.     Deep Tendon Reflexes:     Reflex Scores:      Patellar reflexes are 2+ on the right side and 2+ on the left side. Psychiatric:        Mood and Affect: Mood normal.           Assessment & Plan:  Preventative health care Assessment &  Plan: Discussed Shingrix vaccine  Colonoscopy N/A given age PSA UTD  Discussed the importance of a healthy diet and regular exercise in order for weight loss, and to reduce the risk of further co-morbidity.  Exam stable. Labs pending.  Follow up in 1 year for repeat physical.    Pure hypercholesterolemia Assessment & Plan: Repeat lipid panel pending.  Continue atorvastatin  20 mg daily  Orders: -     Lipid panel  Prediabetes Assessment & Plan: Repeat A1c pending.  Orders: -     Hemoglobin A1c  Essential hypertension Assessment & Plan: Controlled.  Continue diltiazem  180 mg daily  Orders: -     Comprehensive metabolic panel with GFR  Nasal sore Assessment & Plan: Triamcinolone  ineffective.  Start mupirocin  2% ointment.  Orders: -     Mupirocin ; Apply 1 Application topically 2 (two) times daily as needed.  Dispense: 22 g; Refill: 0  Dilated aortic root (HCC) Assessment & Plan: Following with cardiology, overdue.  Due for echocardiogram.  I offered to order for patient and he declines.  He prefers to follow-up with cardiology.   Persistent atrial fibrillation (HCC) Assessment & Plan: Noted on exam.  Continue diltiazem  180 mg daily, apixaban  5 mg twice daily   Gastroesophageal reflux disease, unspecified whether esophagitis present Assessment & Plan: Controlled.  Continue pantoprazole  40 mg daily.  History of prostate cancer Assessment & Plan: Following with urology, PSA up-to-date per patient.           Arlee Santosuosso K Harli Engelken, NP

## 2023-11-25 NOTE — Assessment & Plan Note (Signed)
 Repeat A1c pending

## 2023-11-25 NOTE — Assessment & Plan Note (Signed)
 Following with cardiology, overdue.  Due for echocardiogram.  I offered to order for patient and he declines.  He prefers to follow-up with cardiology.

## 2023-11-25 NOTE — Assessment & Plan Note (Signed)
 Noted on exam.  Continue diltiazem  180 mg daily, apixaban  5 mg twice daily

## 2023-11-25 NOTE — Assessment & Plan Note (Signed)
 Discussed Shingrix vaccine  Colonoscopy N/A given age PSA UTD  Discussed the importance of a healthy diet and regular exercise in order for weight loss, and to reduce the risk of further co-morbidity.  Exam stable. Labs pending.  Follow up in 1 year for repeat physical.

## 2023-11-25 NOTE — Assessment & Plan Note (Signed)
 Triamcinolone  ineffective.  Start mupirocin  2% ointment.

## 2023-11-25 NOTE — Assessment & Plan Note (Signed)
 Repeat lipid panel pending. Continue atorvastatin 20 mg daily.

## 2023-12-26 ENCOUNTER — Other Ambulatory Visit: Payer: Self-pay | Admitting: Primary Care

## 2023-12-26 DIAGNOSIS — K219 Gastro-esophageal reflux disease without esophagitis: Secondary | ICD-10-CM

## 2024-01-21 ENCOUNTER — Other Ambulatory Visit: Payer: Self-pay | Admitting: Primary Care

## 2024-01-21 DIAGNOSIS — R0981 Nasal congestion: Secondary | ICD-10-CM

## 2024-04-18 ENCOUNTER — Other Ambulatory Visit: Payer: Self-pay | Admitting: Primary Care

## 2024-04-18 DIAGNOSIS — J3489 Other specified disorders of nose and nasal sinuses: Secondary | ICD-10-CM

## 2024-06-05 DIAGNOSIS — I4891 Unspecified atrial fibrillation: Secondary | ICD-10-CM

## 2024-06-06 MED ORDER — APIXABAN 5 MG PO TABS: 5.0000 mg | ORAL_TABLET | Freq: Two times a day (BID) | ORAL | 1 refills | Status: AC

## 2024-06-15 ENCOUNTER — Other Ambulatory Visit: Payer: Self-pay | Admitting: Cardiology

## 2024-06-22 ENCOUNTER — Other Ambulatory Visit: Payer: Self-pay | Admitting: Primary Care

## 2024-06-22 DIAGNOSIS — E7800 Pure hypercholesterolemia, unspecified: Secondary | ICD-10-CM

## 2024-10-19 ENCOUNTER — Ambulatory Visit

## 2024-10-25 ENCOUNTER — Ambulatory Visit

## 2024-11-27 ENCOUNTER — Encounter: Admitting: Primary Care
